# Patient Record
Sex: Female | Born: 1960 | Race: White | Hispanic: No | Smoking: Former smoker
Health system: Southern US, Community
[De-identification: ages and names within clinical notes are randomized; demographics above are authoritative.]

## PROBLEM LIST (undated history)

## (undated) DIAGNOSIS — I10 Essential (primary) hypertension: Secondary | ICD-10-CM

## (undated) DIAGNOSIS — J449 Chronic obstructive pulmonary disease, unspecified: Secondary | ICD-10-CM

## (undated) DIAGNOSIS — M810 Age-related osteoporosis without current pathological fracture: Secondary | ICD-10-CM

## (undated) DIAGNOSIS — R350 Frequency of micturition: Secondary | ICD-10-CM

## (undated) DIAGNOSIS — Z8742 Personal history of other diseases of the female genital tract: Secondary | ICD-10-CM

## (undated) DIAGNOSIS — F329 Major depressive disorder, single episode, unspecified: Secondary | ICD-10-CM

## (undated) DIAGNOSIS — K219 Gastro-esophageal reflux disease without esophagitis: Secondary | ICD-10-CM

## (undated) DIAGNOSIS — M329 Systemic lupus erythematosus, unspecified: Secondary | ICD-10-CM

## (undated) DIAGNOSIS — M1990 Unspecified osteoarthritis, unspecified site: Secondary | ICD-10-CM

## (undated) DIAGNOSIS — F419 Anxiety disorder, unspecified: Secondary | ICD-10-CM

## (undated) DIAGNOSIS — Z780 Asymptomatic menopausal state: Secondary | ICD-10-CM

## (undated) DIAGNOSIS — N930 Postcoital and contact bleeding: Secondary | ICD-10-CM

## (undated) DIAGNOSIS — R87619 Unspecified abnormal cytological findings in specimens from cervix uteri: Secondary | ICD-10-CM

## (undated) DIAGNOSIS — R896 Abnormal cytological findings in specimens from other organs, systems and tissues: Secondary | ICD-10-CM

## (undated) DIAGNOSIS — M797 Fibromyalgia: Secondary | ICD-10-CM

## (undated) DIAGNOSIS — R87629 Unspecified abnormal cytological findings in specimens from vagina: Secondary | ICD-10-CM

## (undated) DIAGNOSIS — R319 Hematuria, unspecified: Secondary | ICD-10-CM

## (undated) DIAGNOSIS — C55 Malignant neoplasm of uterus, part unspecified: Secondary | ICD-10-CM

## (undated) DIAGNOSIS — IMO0002 Reserved for concepts with insufficient information to code with codable children: Secondary | ICD-10-CM

## (undated) DIAGNOSIS — K279 Peptic ulcer, site unspecified, unspecified as acute or chronic, without hemorrhage or perforation: Secondary | ICD-10-CM

## (undated) HISTORY — DX: Unspecified abnormal cytological findings in specimens from cervix uteri: R87.619

## (undated) HISTORY — PX: OTHER SURGICAL HISTORY: SHX169

## (undated) HISTORY — DX: Asymptomatic menopausal state: Z78.0

## (undated) HISTORY — DX: Anxiety disorder, unspecified: F41.9

## (undated) HISTORY — DX: Abnormal cytological findings in specimens from other organs, systems and tissues: R89.6

## (undated) HISTORY — DX: Age-related osteoporosis without current pathological fracture: M81.0

## (undated) HISTORY — DX: Gastro-esophageal reflux disease without esophagitis: K21.9

## (undated) HISTORY — DX: Postcoital and contact bleeding: N93.0

## (undated) HISTORY — DX: Unspecified osteoarthritis, unspecified site: M19.90

## (undated) HISTORY — DX: Peptic ulcer, site unspecified, unspecified as acute or chronic, without hemorrhage or perforation: K27.9

## (undated) HISTORY — PX: ECTOPIC PREGNANCY SURGERY: SHX613

## (undated) HISTORY — DX: Major depressive disorder, single episode, unspecified: F32.9

## (undated) HISTORY — DX: Unspecified abnormal cytological findings in specimens from vagina: R87.629

## (undated) HISTORY — DX: Reserved for concepts with insufficient information to code with codable children: IMO0002

## (undated) HISTORY — DX: Personal history of other diseases of the female genital tract: Z87.42

## (undated) HISTORY — DX: Frequency of micturition: R35.0

## (undated) HISTORY — DX: Hematuria, unspecified: R31.9

## (undated) HISTORY — DX: Fibromyalgia: M79.7

## (undated) HISTORY — DX: Systemic lupus erythematosus, unspecified: M32.9

## (undated) HISTORY — DX: Malignant neoplasm of uterus, part unspecified: C55

---

## 2007-03-18 ENCOUNTER — Ambulatory Visit (HOSPITAL_COMMUNITY): Admission: RE | Admit: 2007-03-18 | Discharge: 2007-03-18 | Payer: Self-pay | Admitting: Family Medicine

## 2007-08-13 ENCOUNTER — Ambulatory Visit (HOSPITAL_COMMUNITY): Admission: RE | Admit: 2007-08-13 | Discharge: 2007-08-13 | Payer: Self-pay | Admitting: Family Medicine

## 2007-09-17 ENCOUNTER — Ambulatory Visit (HOSPITAL_COMMUNITY): Admission: RE | Admit: 2007-09-17 | Discharge: 2007-09-17 | Payer: Self-pay | Admitting: Family Medicine

## 2007-09-19 ENCOUNTER — Ambulatory Visit (HOSPITAL_COMMUNITY): Admission: RE | Admit: 2007-09-19 | Discharge: 2007-09-19 | Payer: Self-pay | Admitting: Family Medicine

## 2007-09-27 ENCOUNTER — Ambulatory Visit: Payer: Self-pay | Admitting: Internal Medicine

## 2007-10-10 ENCOUNTER — Other Ambulatory Visit: Admission: RE | Admit: 2007-10-10 | Discharge: 2007-10-10 | Payer: Self-pay | Admitting: Obstetrics and Gynecology

## 2007-10-14 ENCOUNTER — Ambulatory Visit (HOSPITAL_COMMUNITY): Admission: RE | Admit: 2007-10-14 | Discharge: 2007-10-14 | Payer: Self-pay | Admitting: Obstetrics and Gynecology

## 2007-10-21 ENCOUNTER — Encounter (INDEPENDENT_AMBULATORY_CARE_PROVIDER_SITE_OTHER): Payer: Self-pay | Admitting: Diagnostic Radiology

## 2007-10-21 ENCOUNTER — Ambulatory Visit (HOSPITAL_COMMUNITY): Admission: RE | Admit: 2007-10-21 | Discharge: 2007-10-21 | Payer: Self-pay | Admitting: Obstetrics and Gynecology

## 2008-07-15 ENCOUNTER — Emergency Department (HOSPITAL_COMMUNITY): Admission: EM | Admit: 2008-07-15 | Discharge: 2008-07-15 | Payer: Self-pay | Admitting: Emergency Medicine

## 2008-08-03 ENCOUNTER — Ambulatory Visit (HOSPITAL_COMMUNITY): Admission: RE | Admit: 2008-08-03 | Discharge: 2008-08-03 | Payer: Self-pay | Admitting: Family Medicine

## 2008-08-19 ENCOUNTER — Ambulatory Visit (HOSPITAL_COMMUNITY): Admission: RE | Admit: 2008-08-19 | Discharge: 2008-08-19 | Payer: Self-pay | Admitting: Family Medicine

## 2009-04-15 ENCOUNTER — Other Ambulatory Visit: Admission: RE | Admit: 2009-04-15 | Discharge: 2009-04-15 | Payer: Self-pay | Admitting: Obstetrics and Gynecology

## 2009-08-03 ENCOUNTER — Emergency Department (HOSPITAL_COMMUNITY): Admission: EM | Admit: 2009-08-03 | Discharge: 2009-08-03 | Payer: Self-pay | Admitting: Emergency Medicine

## 2010-02-02 ENCOUNTER — Ambulatory Visit (HOSPITAL_COMMUNITY): Admission: RE | Admit: 2010-02-02 | Discharge: 2010-02-02 | Payer: Self-pay | Admitting: Obstetrics and Gynecology

## 2010-06-02 ENCOUNTER — Other Ambulatory Visit
Admission: RE | Admit: 2010-06-02 | Discharge: 2010-06-02 | Payer: Self-pay | Source: Home / Self Care | Admitting: Obstetrics and Gynecology

## 2010-09-02 ENCOUNTER — Ambulatory Visit (HOSPITAL_COMMUNITY)
Admission: RE | Admit: 2010-09-02 | Discharge: 2010-09-02 | Disposition: A | Discharge status: Home / Self Care | Payer: 59 | Source: Ambulatory Visit | Attending: Pain Medicine | Admitting: Pain Medicine

## 2010-09-02 ENCOUNTER — Other Ambulatory Visit (HOSPITAL_COMMUNITY): Payer: Self-pay | Admitting: Unknown Physician Specialty

## 2010-09-02 DIAGNOSIS — M25559 Pain in unspecified hip: Secondary | ICD-10-CM | POA: Insufficient documentation

## 2010-09-02 DIAGNOSIS — M545 Low back pain, unspecified: Secondary | ICD-10-CM | POA: Insufficient documentation

## 2010-09-02 DIAGNOSIS — R52 Pain, unspecified: Secondary | ICD-10-CM

## 2010-09-21 LAB — URINE MICROSCOPIC-ADD ON

## 2010-09-21 LAB — URINALYSIS, ROUTINE W REFLEX MICROSCOPIC
Bilirubin Urine: NEGATIVE
Glucose, UA: NEGATIVE mg/dL
Nitrite: NEGATIVE
Specific Gravity, Urine: 1.01 (ref 1.005–1.030)
pH: 6.5 (ref 5.0–8.0)

## 2010-09-21 LAB — URINE CULTURE: Colony Count: >=100000

## 2010-10-17 LAB — COMPREHENSIVE METABOLIC PANEL
ALT: 24 U/L (ref 0–35)
AST: 22 U/L (ref 0–37)
BUN: 4 mg/dL — ABNORMAL LOW (ref 6–23)
CO2: 28 mEq/L (ref 19–32)
Chloride: 101 mEq/L (ref 96–112)
GFR calc non Af Amer: >60 mL/min (ref >60)
Glucose, Bld: 78 mg/dL (ref 70–99)
Total Bilirubin: 0.5 mg/dL (ref 0.3–1.2)
Total Protein: 6.5 g/dL (ref 6.0–8.3)

## 2010-10-17 LAB — POCT CARDIAC MARKERS
Myoglobin, poc: 52.5 ng/mL (ref 12–200)
Troponin i, poc: <0.05 ng/mL (ref 0.00–0.09)
Troponin i, poc: <0.05 ng/mL (ref 0.00–0.09)

## 2010-10-17 LAB — CBC
Hemoglobin: 13.6 g/dL (ref 12.0–15.0)
MCHC: 34.3 g/dL (ref 30.0–36.0)
MCV: 98.9 fL (ref 78.0–100.0)
Platelets: 193 10*3/uL (ref 150–400)
RBC: 4 MIL/uL (ref 3.87–5.11)
RDW: 12.8 % (ref 11.5–15.5)
WBC: 6.7 10*3/uL (ref 4.0–10.5)

## 2010-10-17 LAB — DIFFERENTIAL
Basophils Absolute: 0 10*3/uL (ref 0.0–0.1)
Eosinophils Absolute: 0.2 10*3/uL (ref 0.0–0.7)
Eosinophils Relative: 4 % (ref 0–5)
Lymphocytes Relative: 34 % (ref 12–46)
Lymphs Abs: 2.3 10*3/uL (ref 0.7–4.0)
Monocytes Absolute: 0.4 10*3/uL (ref 0.1–1.0)
Monocytes Relative: 6 % (ref 3–12)
Neutro Abs: 3.7 10*3/uL (ref 1.7–7.7)
Neutrophils Relative %: 55 % (ref 43–77)

## 2010-10-28 ENCOUNTER — Other Ambulatory Visit (HOSPITAL_COMMUNITY): Payer: Self-pay | Admitting: Family Medicine

## 2010-10-28 DIAGNOSIS — R1011 Right upper quadrant pain: Secondary | ICD-10-CM

## 2010-11-01 ENCOUNTER — Ambulatory Visit (HOSPITAL_COMMUNITY)
Admission: RE | Admit: 2010-11-01 | Discharge: 2010-11-01 | Disposition: A | Discharge status: Home / Self Care | Payer: 59 | Source: Ambulatory Visit | Attending: Family Medicine | Admitting: Family Medicine

## 2010-11-01 DIAGNOSIS — K7689 Other specified diseases of liver: Secondary | ICD-10-CM | POA: Insufficient documentation

## 2010-11-01 DIAGNOSIS — R9389 Abnormal findings on diagnostic imaging of other specified body structures: Secondary | ICD-10-CM | POA: Insufficient documentation

## 2010-11-01 DIAGNOSIS — R1011 Right upper quadrant pain: Secondary | ICD-10-CM | POA: Insufficient documentation

## 2010-11-07 ENCOUNTER — Encounter: Payer: Self-pay | Admitting: Gastroenterology

## 2010-11-07 ENCOUNTER — Ambulatory Visit (INDEPENDENT_AMBULATORY_CARE_PROVIDER_SITE_OTHER): Payer: 59 | Admitting: Gastroenterology

## 2010-11-07 VITALS — BP 131/87 | HR 105 | Temp 98.6°F | Ht 63.0 in | Wt 143.0 lb

## 2010-11-07 DIAGNOSIS — K7689 Other specified diseases of liver: Secondary | ICD-10-CM

## 2010-11-07 DIAGNOSIS — R1011 Right upper quadrant pain: Secondary | ICD-10-CM

## 2010-11-07 DIAGNOSIS — R110 Nausea: Secondary | ICD-10-CM

## 2010-11-07 DIAGNOSIS — Q619 Cystic kidney disease, unspecified: Secondary | ICD-10-CM

## 2010-11-07 DIAGNOSIS — K760 Fatty (change of) liver, not elsewhere classified: Secondary | ICD-10-CM | POA: Insufficient documentation

## 2010-11-07 DIAGNOSIS — N281 Cyst of kidney, acquired: Secondary | ICD-10-CM | POA: Insufficient documentation

## 2010-11-07 DIAGNOSIS — Z8379 Family history of other diseases of the digestive system: Secondary | ICD-10-CM

## 2010-11-07 DIAGNOSIS — K869 Disease of pancreas, unspecified: Secondary | ICD-10-CM

## 2010-11-07 LAB — CBC WITH DIFFERENTIAL/PLATELET
Hemoglobin: 13.9 g/dL (ref 12.0–16.0)
platelet count: 245

## 2010-11-07 LAB — HEPATIC FUNCTION PANEL
ALT: 12 U/L (ref 7–35)
AST: 17 U/L
Alkaline Phosphatase: 53 U/L
Bilirubin, Direct: 0.1 mg/dL (ref 0.01–0.4)
Indirect Bilirubin: 0.3

## 2010-11-07 NOTE — Assessment & Plan Note (Signed)
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 Reevaluate comment MR of the abdomen. May consider referral for urological opinion.

## 2010-11-07 NOTE — Assessment & Plan Note (Signed)
Refer to assessment and plan for right upper quadrant abdominal pain.

## 2010-11-07 NOTE — Assessment & Plan Note (Signed)
Father and multiple paternal uncles had cirrhosis of the liver. Father died with liver cancer. All of them were heavy drinkers. No details of any other type of liver disease within her family. MRI of the abdomen planned. Encouraged avoidance of alcohol use.

## 2010-11-07 NOTE — Assessment & Plan Note (Signed)
4 week history of persistent right upper quadrant abdominal pain, associated with nausea and abdominal bloating. Pain worse with prolonged sitting. Nausea and bloating worse with meals. Pain is constant and not necessarily aggravated by meals. Abdominal ultrasound multiple findings as outlined above. I don't think any of them explain her abdominal pain however. HIDA scan to eliminate gallbladder disease as a possibility. This may be more musculoskeletal or abdominal wall pain.

## 2010-11-07 NOTE — Assessment & Plan Note (Signed)
Likely fatty liver based on abdominal ultrasound on the images are limited. Further evaluation with MR at time of evaluation of pancreatic lesion. She is not significantly overweight. She consumes alcohol very infrequently however had a few short years of more regular use which was going to divorce. No personal history of diabetes however diabetes is significant for her family. LFTs currently normal. Basic metabolic panel to be done.

## 2010-11-07 NOTE — Progress Notes (Signed)
Primary Care Physician:  Alice Reichert, MD  Primary Gastroenterologist:  Dr. Roetta Sessions  Chief Complaint  Patient presents with  . Abdominal Pain    RUQ Pain and swelling    HPI:  Susan Baker is a 50 y.o. female here at request of Dr. Butch Penny for further evaluation of right upper quadrant abdominal pain and bloating. Abdominal bloating some days. Abdomen sore. Different than pain in 2009. Pain present for last four weeks. Not really worse with meals, pain is constant. Bloating worse with meals. Nausea worse with meal. No heartburn. Some belching, not bad. BM regular. BM most days. No melena, brbpr. No weight loss. Little weight gain on Cymbalta. Denies any cough, shortness of breath, dysuria, hematuria.  Abdominal ultrasound recently showed normal gallbladder, common bile duct 5 mm, echogenic, likely fatty infiltration of the liver (detail suboptimal), questionable fatty replacement of the pancreas, tiny focus of questionable hypo-echogenicity at pancreatic body measuring 12 mm, tiny mass or nodule not excluded. 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 No hydronephrosis.  Recent labs showed a normal white blood cell count of 9300, hemoglobin 13.9, platelet count 245,000, total bilirubin 0.4, alkaline phosphatase 53, AST 17, ALT 12, albumin 4.5   Current Outpatient Prescriptions  Medication Sig Dispense Refill  . cyclobenzaprine (FLEXERIL) 10 MG tablet Take 10 mg by mouth 3 (three) times daily as needed.        . CYMBALTA 60 MG capsule       . HYDROcodone-acetaminophen (NORCO) 10-325 MG per tablet         Allergies as of 11/07/2010 - Review Complete 11/07/2010  Allergen Reaction Noted  . Daypro (oxaprozin)  11/07/2010    Past Medical History  Diagnosis Date  . Fibromyalgia   . Uterine cancer     . PUD (peptic ulcer disease)     bleeding PUD per patient, remote, On Daypro  . GERD (gastroesophageal reflux disease)     egd with RE per patient, remote  . Bulging disc     cervical, Pain [facility]    Past Surgical History  Procedure Date  . Hysterectomy for uterine cancer     partial  . Ectopic pregnancy surgery     twice    Family History  Problem Relation Age of Onset  . Cirrhosis Father     etoh, died with liver cancer  . Cirrhosis Paternal Uncle     multiple, etoh  . Colon cancer Neg Hx   . Pancreatic cancer Paternal Grandmother     History   Social History  . Marital Status: Divorced    Spouse Name: N/A    Number of Children: 1  . 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 Smoking status: Current Everyday Smoker -- 1.0 packs/day    Types: Cigarettes  . Smokeless tobacco: Not on file  . Alcohol Use: Yes     One drink ever 3 weeks  . Drug Use: No  . Sexually Active: Not on file      ROS:  General: Negative for anorexia, weight loss, fever, chills, fatigue, weakness. Eyes: Negative for vision changes.  ENT: Negative for hoarseness, difficulty swallowing , nasal congestion. CV: Negative for chest pain, angina, palpitations, dyspnea on exertion, peripheral edema.  Respiratory: Negative for dyspnea at rest, dyspnea on exertion, cough, sputum, wheezing.  GI: See history of present illness. GU:  Negative for dysuria, hematuria, urinary incontinence, urinary frequency, nocturnal urination.  MS: Negative for joint pain, low back pain.  Derm: Negative for rash or itching.  Neuro: Negative for weakness, abnormal sensation, seizure, frequent headaches, memory loss, confusion.  Psych: Negative for anxiety, depression, suicidal ideation, hallucinations.  Endo: Negative for unusual weight change.  Heme: Negative for bruising or bleeding. Allergy: Negative for rash or hives.    Physical  Examination:  BP 131/87  Pulse 105  Temp(Src) 98.6 F (37 C) (Tympanic)  Ht 5\' 3"  (1.6 m)  Wt 143 lb (64.864 kg)  BMI 25.33 kg/m2   General: Well-nourished, well-developed in no acute distress.  Head: Normocephalic, atraumatic.   Eyes: Conjunctiva pink, no icterus. Mouth: Oropharyngeal mucosa moist and pink , no lesions erythema or exudate. Neck: Supple without thyromegaly, masses, or lymphadenopathy.  Lungs: Clear to auscultation bilaterally.  Heart: Regular rate and rhythm, no murmurs rubs or gallops.  Abdomen: Bowel sounds are normal, mild RUQ tenderness including right lower ribcage, mild diffuse tenderness, nondistended, no hepatosplenomegaly or masses, no abdominal bruits or    hernia , no rebound or guarding.   Extremities: No lower extremity edema.  Neuro: Alert and oriented x 4 , grossly normal neurologically.  Skin: Warm and dry, no rash or jaundice.   Psych: Alert and cooperative, normal mood and affect.

## 2010-11-07 NOTE — Progress Notes (Signed)
Cc to PCP 

## 2010-11-07 NOTE — Assessment & Plan Note (Signed)
12 mm pancreatic lesion seen on abdominal ultrasound. Needs further evaluation. Radiologist recommends MRI of the abdomen with and without contrast. Basic metabolic panel and lipase to be done.

## 2010-11-08 ENCOUNTER — Ambulatory Visit (HOSPITAL_COMMUNITY)
Admission: RE | Admit: 2010-11-08 | Discharge: 2010-11-08 | Disposition: A | Discharge status: Home / Self Care | Payer: 59 | Source: Ambulatory Visit | Attending: Gastroenterology | Admitting: Gastroenterology

## 2010-11-08 DIAGNOSIS — R109 Unspecified abdominal pain: Secondary | ICD-10-CM | POA: Insufficient documentation

## 2010-11-08 DIAGNOSIS — K869 Disease of pancreas, unspecified: Secondary | ICD-10-CM

## 2010-11-08 DIAGNOSIS — Z8379 Family history of other diseases of the digestive system: Secondary | ICD-10-CM

## 2010-11-08 DIAGNOSIS — K7689 Other specified diseases of liver: Secondary | ICD-10-CM | POA: Insufficient documentation

## 2010-11-08 DIAGNOSIS — N281 Cyst of kidney, acquired: Secondary | ICD-10-CM

## 2010-11-08 DIAGNOSIS — K760 Fatty (change of) liver, not elsewhere classified: Secondary | ICD-10-CM

## 2010-11-08 DIAGNOSIS — R1011 Right upper quadrant pain: Secondary | ICD-10-CM

## 2010-11-08 DIAGNOSIS — N200 Calculus of kidney: Secondary | ICD-10-CM | POA: Insufficient documentation

## 2010-11-08 MED ORDER — GADOBENATE DIMEGLUMINE 529 MG/ML IV SOLN: 13.0000 mL | Freq: Once | INTRAVENOUS | Status: AC | PRN

## 2010-11-09 ENCOUNTER — Encounter (HOSPITAL_COMMUNITY)
Admission: RE | Admit: 2010-11-09 | Discharge: 2010-11-09 | Disposition: A | Discharge status: Home / Self Care | Payer: 59 | Source: Ambulatory Visit | Attending: Gastroenterology | Admitting: Gastroenterology

## 2010-11-09 ENCOUNTER — Encounter (HOSPITAL_COMMUNITY): Payer: Self-pay

## 2010-11-09 ENCOUNTER — Encounter: Payer: Self-pay | Admitting: Gastroenterology

## 2010-11-09 DIAGNOSIS — R1011 Right upper quadrant pain: Secondary | ICD-10-CM | POA: Insufficient documentation

## 2010-11-09 DIAGNOSIS — R110 Nausea: Secondary | ICD-10-CM

## 2010-11-09 MED ORDER — TECHNETIUM TC 99M MEBROFENIN IV KIT
5.0000 | PACK | Freq: Once | INTRAVENOUS | Status: AC | PRN
  Administered 2010-11-09: 5 via INTRAVENOUS

## 2010-11-10 ENCOUNTER — Encounter: Payer: Self-pay | Admitting: Gastroenterology

## 2010-11-10 ENCOUNTER — Telehealth: Payer: Self-pay | Admitting: Gastroenterology

## 2010-11-10 MED ORDER — DEXLANSOPRAZOLE 60 MG PO CPDR: 60.0000 mg | DELAYED_RELEASE_CAPSULE | Freq: Every day | ORAL | Status: DC

## 2010-11-10 NOTE — Telephone Encounter (Signed)
Tried to call patient, [facility] for return call.

## 2010-11-10 NOTE — Telephone Encounter (Signed)
Discussed all results with patient. Questions answered. Nexium not covered on plan. Please cancel RX. Try Dexilant 60mg  po daily #30, 3 rf. RX sent to [facility]. Give rebate card and #10 samples. Patient plans to come by Monday to have me sign return to work note. Patient will drop off FMLA forms for me to fill out.

## 2010-11-11 NOTE — Telephone Encounter (Signed)
Samples at front desk 

## 2010-11-15 NOTE — H&P (Signed)
NAME:  Susan Baker, Susan Baker                 ACCOUNT NO.:  [id number]   MEDICAL RECORD NO.:  [id number]          PATIENT TYPE:  AMB   LOCATION:  DAY                            FACILITY:   PHYSICIAN:  R. Roetta Sessions, M.D. DATE OF BIRTH:  1960-08-07   DATE OF ADMISSION:  DATE OF DISCHARGE:  LH                              HISTORY & PHYSICAL   REASON FOR CONSULTATION:  Right upper quadrant, right flank pain.   HISTORY OF PRESENT ILLNESS:  Ms. Ciccone is a very pleasant 50 year old  Caucasian female from [place], [place], sent over at the courtesy of  Dr. Butch Penny to further evaluate a several-week history of  intermittent right upper quadrant abdominal pain which she relates  radiates into her lateral right ribcage margin and somewhat into her  back.  She states this has come on a time or two when she is laying in  her bathtub washing her hair, and other times after she eats.  It may  occur after eating a heavy meal or it may occur after eating something  such as cereal.  It may last for hours.  These symptoms wax and wane  throughout the day and do not otherwise have a definite positional  component.  She really denies any midline abdominal pain and no typical  reflux symptoms, odynophagia, dysphagia, early satiety.  She does have  some nausea and bloating.  She says she underwent an EGD several years  ago and was told she had acid burns in her esophagus and should be on  acid blocker agent for life which she is not currently taking.  She has  not had any melena or hematochezia.  She is chronically constipated, has  one bowel movement daily to every-other day to every third day on  occasion.  She has a personal history of cancer of the uterus status  post hysterectomy.   Ms. Stovall tells me that she had a bleeding stomach ulcer several years  ago and describes going undergoing an EGD and was told that she had a  stomach ulcer.  She does not require remember any other specific   details.   Dr. Renard Matter has worked these symptoms up with a hepatic profile which  came back entirely normal.  She underwent ultrasound of the gallbladder  which demonstrated normal gallbladder biliary tree, two small left renal  cysts.  CT of the abdomen subsequently performed which revealed  nonobstructing calculi upper pole left kidney, no other abnormalities  seen.   She has three to four alcoholic beverages weekly.  She smokes one pack  of cigarettes daily.  She denies rash in the area in question.   PAST MEDICAL HISTORY:  Significant for fibromyalgia.  She has a history  of two ectopic pregnancies, is status post hysterectomy for uterine  cancer.   CURRENT MEDICATIONS:  1. Lyrica 75 mg t.i.d.  2. Hydrocodone 10/500 p.r.n.   ALLERGIES:  DAYPRO.   FAMILY HISTORY:  Father died with cirrhosis.  He was an alcoholic.  He  had multiple other brothers with cirrhosis.  They were drinkers as well.  Otherwise, no history of chronic GI or liver illness.   SOCIAL HISTORY:  The patient is divorced.  She has one son in high  school.  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  Alcohol and tobacco as outlined above.  She denies illicit drug use.   REVIEW OF SYSTEMS:  No chest pain, dyspnea on exertion.  No change in  weight.  No yellow jaundice, clay-colored stool or dark-colored urine.   PHYSICAL EXAMINATION:  Pleasant, tan, 46-year lady, resting comfortably.  Weight 118.5, height 5 feet 3 inches.  Temperature 98, BP 100/70, pulse  88.  SKIN:  Warm and dry, tan, but no cutaneous stigmata of chronic liver  disease.  No scleral icterus.  Conjunctivae are pink.  BREAST EXAM:  Deferred.  She does have an element of chest wall and  lateral rib cage tenderness to palpation.  I do not appreciate a rash.  ABDOMINAL EXAM:  Flat, positive bowel sounds.  She has tenderness from  the epigastrium along the right costal margin.  No appreciable mass or  organomegaly.  EXTREMITY EXAM:   No edema.   IMPRESSION:  Ms. Welden is a pleasant 50 year old lady with a several-  week history of right upper quadrant/right lateral ribcage radiating to  the flank discomfort which has a vague positional component as well as a  postprandial component.  She has a history of complicated  gastroesophageal reflux disease by history, is not on any acid  suppression therapy.  Her symptoms really do not sound as they are  emanating from her luminal upper GI tract.  I doubt we are dealing with  a peptic ulcer or reflux-related phenomenon   She could have occult gallbladder disease in the way of biliary  dyskinesia.  An element of abdominal/chest wall pain is also a  possibility.  Otherwise, there is nothing to suggest something  neuropathic such as shingles at this time.   RECOMMENDATIONS:  I gave Ms. Lorson some AcipHex 20 mg tablets one each  morning for 14 days.  We will see how she does with that therapy  empirically at the end of the 2-week.  If she is not markedly improved  will consider further evaluation.  She gives a history of peptic ulcer  disease previously and would consider an EGD just to rule out luminal  pathology.  Ultimately, she may end up with further evaluation her  gallbladder in the way of a HIDA with fatty meal challenge.   I would like to thank Dr. Butch Penny for allowing me to see this nice  lady today.  Further recommendations to follow.      Jonathon Bellows, M.D.  Electronically Signed     RMR/MEDQ  D:  09/27/2007  T:  09/28/2007  Job:  [id number]   cc:   Angus G. Renard Matter, MD  Fax: [phone]

## 2010-11-21 ENCOUNTER — Telehealth: Payer: Self-pay | Admitting: General Practice

## 2010-11-21 NOTE — Telephone Encounter (Signed)
Patient called and wanted an office visit to come and speak to you about her Cymbalta..._Please advise??

## 2010-11-22 NOTE — Telephone Encounter (Signed)
Would recommend she discuss any questions regarding Cymbalta with the prescribing physician.

## 2010-11-24 ENCOUNTER — Telehealth: Payer: Self-pay

## 2010-11-24 NOTE — Telephone Encounter (Signed)
I informed patient.

## 2010-11-24 NOTE — Telephone Encounter (Signed)
Proceed with EGD. Continue PPI.

## 2010-11-24 NOTE — Telephone Encounter (Signed)
Pt called- she is still having throbbing URQ abd pain. +nausea, no vomiting, no fever, last bm was yesterday and it was normal, no blood in stool. Pt has been out of work 2 days this week and wants to know what the next step is. Please advise.

## 2010-11-25 ENCOUNTER — Encounter: Payer: Self-pay | Admitting: Internal Medicine

## 2010-11-25 NOTE — Telephone Encounter (Signed)
Pt is scheduled for EGD on 12/01/10- went over instructions on the phone and mailed copy- cdg

## 2010-12-01 ENCOUNTER — Encounter: Payer: 59 | Admitting: Internal Medicine

## 2010-12-01 ENCOUNTER — Other Ambulatory Visit: Payer: Self-pay | Admitting: Internal Medicine

## 2010-12-01 ENCOUNTER — Ambulatory Visit (HOSPITAL_COMMUNITY)
Admission: RE | Admit: 2010-12-01 | Discharge: 2010-12-01 | Disposition: A | Discharge status: Home / Self Care | Payer: 59 | Source: Ambulatory Visit | Attending: Internal Medicine | Admitting: Internal Medicine

## 2010-12-01 DIAGNOSIS — R1011 Right upper quadrant pain: Secondary | ICD-10-CM | POA: Insufficient documentation

## 2010-12-01 DIAGNOSIS — K2960 Other gastritis without bleeding: Secondary | ICD-10-CM

## 2010-12-01 DIAGNOSIS — K2100 Gastro-esophageal reflux disease with esophagitis, without bleeding: Secondary | ICD-10-CM | POA: Insufficient documentation

## 2010-12-01 HISTORY — PX: ESOPHAGOGASTRODUODENOSCOPY: SHX1529

## 2010-12-12 ENCOUNTER — Encounter: Payer: Self-pay | Admitting: Gastroenterology

## 2010-12-12 ENCOUNTER — Ambulatory Visit (INDEPENDENT_AMBULATORY_CARE_PROVIDER_SITE_OTHER): Payer: 59 | Admitting: Gastroenterology

## 2010-12-12 VITALS — BP 129/87 | HR 84 | Temp 98.0°F | Ht 63.0 in | Wt 145.2 lb

## 2010-12-12 DIAGNOSIS — R1011 Right upper quadrant pain: Secondary | ICD-10-CM

## 2010-12-12 DIAGNOSIS — K259 Gastric ulcer, unspecified as acute or chronic, without hemorrhage or perforation: Secondary | ICD-10-CM | POA: Insufficient documentation

## 2010-12-12 DIAGNOSIS — K2960 Other gastritis without bleeding: Secondary | ICD-10-CM

## 2010-12-12 MED ORDER — DEXLANSOPRAZOLE 60 MG PO CPDR: 60.0000 mg | DELAYED_RELEASE_CAPSULE | Freq: Every day | ORAL | Status: DC

## 2010-12-12 NOTE — Progress Notes (Signed)
Primary Care Physician: Alice Reichert, MD  Primary Gastroenterologist:  Roetta Sessions, MD  Chief Complaint  Patient presents with  . Follow-up    ruq pain, erosions    HPI: Susan Baker is a 50 y.o. female here for followup visit. She had EGD on 12/01/2010 which showed mild distal erosive reflux esophagitis, antral erosions due to NSAIDs. She has a history of right upper quadrant abdominal pain for several weeks. She's had an extensive evaluation, including abdominal ultrasound, HIDA scan, MRI of the abdomen to follow up with abnormalities on abdominal ultrasound. Please see separate reports. She's been on Dexilant now for several weeks. She is finally noticing some improvement in her symptoms. It is felt that some of her right upper quadrant pain is more musculoskeletal as it is reproduced with palpation over the right lower rib cage margin. She has been on a drug holiday with taking no NSAID's or aspirin. Denies any nausea or vomiting, heartburn, constipation, diarrhea, melena, rectal bleeding. Abdominal pain improved.  Current Outpatient Prescriptions  Medication Sig Dispense Refill  . cyclobenzaprine (FLEXERIL) 10 MG tablet Take 10 mg by mouth 3 (three) times daily as needed.        . CYMBALTA 60 MG capsule 30 mg.       . dexlansoprazole (DEXILANT) 60 MG capsule Take 1 capsule (60 mg total) by mouth daily.  30 capsule  11  . HYDROcodone-acetaminophen (NORCO) 10-325 MG per tablet       . DISCONTD: dexlansoprazole (DEXILANT) 60 MG capsule Take 60 mg by mouth daily.          Allergies as of 12/12/2010 - Review Complete 12/12/2010  Allergen Reaction Noted  . Daypro (oxaprozin)  11/07/2010    ROS:  General: Negative for anorexia, weight loss, fever, chills, fatigue, weakness. ENT: Negative for hoarseness, difficulty swallowing , nasal congestion. CV: Negative for chest pain, angina, palpitations, dyspnea on exertion, peripheral edema.  Respiratory: Negative for dyspnea at rest,  dyspnea on exertion, cough, sputum, wheezing.  GI: See history of present illness. GU:  Negative for dysuria, hematuria, urinary incontinence, urinary frequency, nocturnal urination.  Endo: Negative for unusual weight change.    Physical Examination:   BP 129/87  Pulse 84  Temp(Src) 98 F (36.7 C) (Temporal)  Ht 5\' 3"  (1.6 m)  Wt 145 lb 3.2 oz (65.862 kg)  BMI 25.72 kg/m2  General: Well-nourished, well-developed in no acute distress.  Eyes: No icterus. Mouth: Oropharyngeal mucosa moist and pink , no lesions erythema or exudate. Lungs: Clear to auscultation bilaterally.  Heart: Regular rate and rhythm, no murmurs rubs or gallops.  Abdomen: Bowel sounds are normal, nontender, nondistended, no hepatosplenomegaly or masses, no abdominal bruits or hernia , no rebound or guarding.   Extremities: No lower extremity edema.  Neuro: Alert and oriented x 4   Skin: Warm and dry, no jaundice.   Psych: Alert and cooperative, normal mood and affect.

## 2010-12-12 NOTE — Assessment & Plan Note (Addendum)
Likely multifactorial including musculoskeletal component, antral erosions, erosive reflux esophagitis. Doing better since stopping ibuprofen and BC powders. She was taking at least 2-3 times weekly for headache. Also on Dexilant for several weeks.   Continue to avoid NSAIDS. Apply heating pad 3 times daily to Right lower ribcage. Continue Dexilant. OV in six months with Dr. Jena Gauss or sooner if needed.  Recommend screening colonoscopy at age 50.

## 2010-12-12 NOTE — Progress Notes (Signed)
Cc to PCP 

## 2011-01-16 NOTE — Op Note (Signed)
NAME:  Susan Baker, Susan Baker                 ACCOUNT NO.:  [id number]  MEDICAL RECORD NO.:  [id number]           PATIENT TYPE:  O  LOCATION:  DAYP                          FACILITY:  [facility]  PHYSICIAN:  R. Roetta Sessions, MD FACP FACGDATE OF BIRTH:  March 21, 1961  DATE OF PROCEDURE:  12/01/2010 DATE OF DISCHARGE:                              OPERATIVE REPORT   INDICATIONS FOR PROCEDURE:  A 50 year old lady with 5-week history of right lower anterolateral chest wall pain radiating into the back, some fleeting right upper abdominal quadrant component.  Prior ultrasound demonstrated fatty replacement of pancreas and questionable mass nodule left kidney.  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  Ultrasound of the gallbladder negative. HIDA demonstrating gallbladder of 80%, history of peptic ulcer disease, does take ibuprofen on a regular basis nearly daily.  EGD is now being done.  Risks, benefits, limitations, alternatives and imponderables have been discussed, questions answered.  Please see the documentation in the medical record.  PROCEDURE NOTE:  O2 saturation, blood pressure, pulse and respirations were monitored throughout the entire procedure.  CONSCIOUS SEDATION: 1. Versed 5 mg IV. 2. Demerol 125 mg IV in divided doses. 3. Cetacaine spray for topical oropharyngeal anesthesia.  INSTRUMENT:  Pentax video chip system.  FINDINGS:  Examination of the tubular esophagus revealed a couple of tiny distal esophageal erosions, otherwise the esophagus appeared normal.  EGD junction easily traversed.  Stomach:  Gastric cavity was emptied and insufflated well with air.  Thorough examination of the gastric mucosa including retroflexed view of the proximal stomach esophagogastric junction demonstrated linear antral erosions with a small solitary erosions in the antrum and prepyloric  area and no ulcer infiltrating process seen.  Pylorus was patent and easily traversed. Examination of the bulb and second portion revealed tiny bulbar erosions only.  THERAPEUTIC/DIAGNOSTIC MANEUVERS PERFORMED:  Biopsies of the gastric mucosa were taken to check for H pylori gastritis.  The patient tolerated the procedure well.  IMPRESSION: 1. Tiny distal esophageal erosions consistent with mild erosive reflux     esophagitis. 2. Linear antral erosions more consistent with NSAID effect.  No     ulcer, status post biopsy of bulbar erosions, otherwise D1 and D2     appeared normal.  RECOMMENDATIONS: 1. Continue Dexilant 60 mg orally daily. 2. Drug holiday from ibuprofen and all nonsteroidal's for 2 weeks.  To     follow up with [place] in the office in 2-3 weeks.  Followup on     biopsies.  Further recommendations to follow. 3. Again on physical exam today, a good bit of her right-sided     tenderness and pain is emanating from the rib cage and not the     abdomen. 4. Further recommendations to follow.     Jonathon Bellows, MD FACP [facility]     RMR/MEDQ  D:  12/01/2010  T:  12/02/2010  Job:  [id number]  cc:   Angus G. Renard Matter, MD Fax: [phone]  Electronically Signed by Lorrin Goodell M.D. on 01/16/2011 08:36:21 AM

## 2011-05-19 ENCOUNTER — Encounter: Payer: Self-pay | Admitting: Internal Medicine

## 2011-05-28 ENCOUNTER — Other Ambulatory Visit: Payer: Self-pay | Admitting: Gastroenterology

## 2011-05-29 ENCOUNTER — Other Ambulatory Visit (HOSPITAL_COMMUNITY): Payer: Self-pay | Admitting: Pain Medicine

## 2011-05-29 ENCOUNTER — Ambulatory Visit (HOSPITAL_COMMUNITY)
Admission: RE | Admit: 2011-05-29 | Discharge: 2011-05-29 | Disposition: A | Discharge status: Home / Self Care | Payer: BC Managed Care – PPO | Source: Ambulatory Visit | Attending: Pain Medicine | Admitting: Pain Medicine

## 2011-05-29 DIAGNOSIS — M542 Cervicalgia: Secondary | ICD-10-CM

## 2011-05-29 DIAGNOSIS — M546 Pain in thoracic spine: Secondary | ICD-10-CM | POA: Insufficient documentation

## 2011-05-29 DIAGNOSIS — M549 Dorsalgia, unspecified: Secondary | ICD-10-CM

## 2011-05-29 DIAGNOSIS — M5030 Other cervical disc degeneration, unspecified cervical region: Secondary | ICD-10-CM | POA: Insufficient documentation

## 2011-07-07 ENCOUNTER — Ambulatory Visit: Payer: 59 | Admitting: Internal Medicine

## 2011-07-07 ENCOUNTER — Telehealth: Payer: Self-pay | Admitting: Internal Medicine

## 2011-07-07 NOTE — Telephone Encounter (Signed)
Pt was a no show

## 2011-07-09 NOTE — Telephone Encounter (Signed)
Send her a letter in may requesting she come in and set up a tcs

## 2011-07-10 NOTE — Telephone Encounter (Signed)
Reminder in epic to have follow up OV in May 2013 prior to setting up colonoscopy

## 2011-08-25 ENCOUNTER — Other Ambulatory Visit (HOSPITAL_COMMUNITY): Payer: Self-pay | Admitting: Pain Medicine

## 2011-08-25 DIAGNOSIS — M545 Low back pain: Secondary | ICD-10-CM

## 2011-08-25 DIAGNOSIS — M542 Cervicalgia: Secondary | ICD-10-CM

## 2011-08-25 DIAGNOSIS — M79609 Pain in unspecified limb: Secondary | ICD-10-CM

## 2011-09-05 ENCOUNTER — Ambulatory Visit (HOSPITAL_COMMUNITY): Payer: BC Managed Care – PPO

## 2011-09-07 ENCOUNTER — Other Ambulatory Visit (HOSPITAL_COMMUNITY): Payer: Self-pay | Admitting: Pain Medicine

## 2011-09-07 ENCOUNTER — Ambulatory Visit (HOSPITAL_COMMUNITY)
Admission: RE | Admit: 2011-09-07 | Discharge: 2011-09-07 | Disposition: A | Discharge status: Home / Self Care | Payer: BC Managed Care – PPO | Source: Ambulatory Visit | Attending: Pain Medicine | Admitting: Pain Medicine

## 2011-09-07 DIAGNOSIS — M542 Cervicalgia: Secondary | ICD-10-CM

## 2011-09-07 DIAGNOSIS — M79609 Pain in unspecified limb: Secondary | ICD-10-CM

## 2011-09-07 DIAGNOSIS — M545 Low back pain, unspecified: Secondary | ICD-10-CM | POA: Insufficient documentation

## 2011-09-07 DIAGNOSIS — M5020 Other cervical disc displacement, unspecified cervical region: Secondary | ICD-10-CM | POA: Insufficient documentation

## 2011-10-20 ENCOUNTER — Ambulatory Visit: Payer: Self-pay | Admitting: Internal Medicine

## 2011-11-02 ENCOUNTER — Encounter: Payer: Self-pay | Admitting: Internal Medicine

## 2011-11-02 ENCOUNTER — Other Ambulatory Visit (HOSPITAL_COMMUNITY): Payer: Self-pay | Admitting: Pain Medicine

## 2011-11-02 DIAGNOSIS — M546 Pain in thoracic spine: Secondary | ICD-10-CM

## 2011-11-06 ENCOUNTER — Ambulatory Visit (HOSPITAL_COMMUNITY): Payer: BC Managed Care – PPO

## 2011-11-08 ENCOUNTER — Ambulatory Visit (HOSPITAL_COMMUNITY): Admission: RE | Admit: 2011-11-08 | Payer: BC Managed Care – PPO | Source: Ambulatory Visit

## 2011-11-15 ENCOUNTER — Other Ambulatory Visit: Payer: Self-pay | Admitting: Adult Health

## 2011-11-15 DIAGNOSIS — Z139 Encounter for screening, unspecified: Secondary | ICD-10-CM

## 2011-11-16 ENCOUNTER — Ambulatory Visit (HOSPITAL_COMMUNITY): Payer: BC Managed Care – PPO

## 2011-11-20 ENCOUNTER — Ambulatory Visit (HOSPITAL_COMMUNITY)
Admission: RE | Admit: 2011-11-20 | Discharge: 2011-11-20 | Disposition: A | Discharge status: Home / Self Care | Payer: BC Managed Care – PPO | Source: Ambulatory Visit | Attending: Adult Health | Admitting: Adult Health

## 2011-11-20 ENCOUNTER — Ambulatory Visit (HOSPITAL_COMMUNITY)
Admission: RE | Admit: 2011-11-20 | Discharge: 2011-11-20 | Disposition: A | Discharge status: Home / Self Care | Payer: BC Managed Care – PPO | Source: Ambulatory Visit | Attending: Pain Medicine | Admitting: Pain Medicine

## 2011-11-20 DIAGNOSIS — Z139 Encounter for screening, unspecified: Secondary | ICD-10-CM

## 2011-11-20 DIAGNOSIS — M546 Pain in thoracic spine: Secondary | ICD-10-CM

## 2011-11-20 DIAGNOSIS — Z1231 Encounter for screening mammogram for malignant neoplasm of breast: Secondary | ICD-10-CM | POA: Insufficient documentation

## 2011-11-20 DIAGNOSIS — M5124 Other intervertebral disc displacement, thoracic region: Secondary | ICD-10-CM | POA: Insufficient documentation

## 2011-11-28 ENCOUNTER — Other Ambulatory Visit (HOSPITAL_COMMUNITY): Payer: Self-pay | Admitting: Orthopedic Surgery

## 2011-11-28 DIAGNOSIS — M545 Low back pain: Secondary | ICD-10-CM

## 2011-11-30 ENCOUNTER — Ambulatory Visit (HOSPITAL_COMMUNITY): Payer: BC Managed Care – PPO

## 2012-01-17 ENCOUNTER — Other Ambulatory Visit: Payer: Self-pay | Admitting: Adult Health

## 2012-01-17 ENCOUNTER — Other Ambulatory Visit (HOSPITAL_COMMUNITY)
Admission: RE | Admit: 2012-01-17 | Discharge: 2012-01-17 | Disposition: A | Discharge status: Home / Self Care | Payer: BC Managed Care – PPO | Source: Ambulatory Visit | Attending: Obstetrics and Gynecology | Admitting: Obstetrics and Gynecology

## 2012-01-17 DIAGNOSIS — Z01419 Encounter for gynecological examination (general) (routine) without abnormal findings: Secondary | ICD-10-CM | POA: Insufficient documentation

## 2012-02-01 ENCOUNTER — Telehealth: Payer: Self-pay

## 2012-02-01 NOTE — Telephone Encounter (Signed)
LMOM to call.

## 2012-02-12 NOTE — Telephone Encounter (Signed)
Letter mailed for pt to call.  

## 2012-02-21 ENCOUNTER — Telehealth: Payer: Self-pay

## 2012-02-22 NOTE — Telephone Encounter (Signed)
Needs OV 1st secondary to polypharmacy. Thanks

## 2012-02-22 NOTE — Telephone Encounter (Signed)
LMOM to call and schedule OV appt.

## 2012-02-22 NOTE — Telephone Encounter (Signed)
Gastroenterology Pre-Procedure Form   Pt had recent problems with fibromyalgia and some herniated discs   Request Date: 02/21/2012      Requesting Physician: Cyril Mourning     PATIENT INFORMATION:  Susan Baker is a 51 y.o., female (DOB=09/07/1960).  PROCEDURE: Procedure(s) requested: colonoscopy Procedure Reason: screening for colon cancer  PATIENT REVIEW QUESTIONS: The patient reports the following:   1. Diabetes Melitis: no 2. Joint replacements in the past 12 months: no 3. Major health problems in the past 3 months: no 4. Has an artificial valve or MVP:no 5. Has been advised in past to take antibiotics in advance of a procedure like teeth cleaning: no}    MEDICATIONS & ALLERGIES:    Patient reports the following regarding taking any blood thinners:   Plavix? no Aspirin?no Coumadin?  no  Patient confirms/reports the following medications:  Current Outpatient Prescriptions  Medication Sig Dispense Refill  . ARIPiprazole (ABILIFY) 5 MG tablet Take 5 mg by mouth daily.      Marland Kitchen buPROPion (WELLBUTRIN XL) 300 MG 24 hr tablet Take 300 mg by mouth daily.      Marland Kitchen HYDROcodone-acetaminophen (NORCO) 10-325 MG per tablet every 6 (six) hours as needed.       . pregabalin (LYRICA) 75 MG capsule Take 75 mg by mouth 3 (three) times daily.      . cyclobenzaprine (FLEXERIL) 10 MG tablet Take 10 mg by mouth 3 (three) times daily as needed.        . CYMBALTA 60 MG capsule 30 mg.       . DEXILANT 60 MG capsule TAKE ONE CAPSULE BY MOUTH EVERY DAY  30 capsule  11  . dexlansoprazole (DEXILANT) 60 MG capsule Take 1 capsule (60 mg total) by mouth daily.  30 capsule  11    Patient confirms/reports the following allergies:  Allergies  Allergen Reactions  . Cymbalta (Duloxetine Hcl) Other (See Comments)    Depression, anxiety, wt gain  . Daypro (Oxaprozin) Other (See Comments)    Ulcers  . Trazodone And Nefazodone Other (See Comments)    Pt said she just gets crazy with this    Patient is  appropriate to schedule for requested procedure(s):   AUTHORIZATION INFORMATION Primary Insurance:   ID #:   Group #:  Pre-Cert / Auth required:  Pre-Cert / Auth #:   Secondary Insurance:   ID #:   Group #:  Pre-Cert / Auth required: Pre-Cert / Auth #:   No orders of the defined types were placed in this encounter.    SCHEDULE INFORMATION: Procedure has been scheduled as follows:  Date:             Time: Location:  This Gastroenterology Pre-Precedure Form is being routed to the following provider(s) for review:  Dr. Jena Gauss

## 2012-02-26 NOTE — Telephone Encounter (Signed)
LMOM to call and schedule OV.

## 2012-03-01 NOTE — Telephone Encounter (Signed)
LMOM to call and schedule OV. Mailing pt another letter. Sending a letter to [employer].

## 2012-10-07 ENCOUNTER — Other Ambulatory Visit (HOSPITAL_COMMUNITY): Payer: Self-pay | Admitting: Pain Medicine

## 2012-10-07 DIAGNOSIS — M25512 Pain in left shoulder: Secondary | ICD-10-CM

## 2012-10-14 ENCOUNTER — Encounter (HOSPITAL_COMMUNITY): Payer: Self-pay

## 2012-10-14 ENCOUNTER — Ambulatory Visit (HOSPITAL_COMMUNITY)
Admission: RE | Admit: 2012-10-14 | Discharge: 2012-10-14 | Disposition: A | Discharge status: Home / Self Care | Payer: No Typology Code available for payment source | Source: Ambulatory Visit | Attending: Pain Medicine | Admitting: Pain Medicine

## 2012-10-14 DIAGNOSIS — M67919 Unspecified disorder of synovium and tendon, unspecified shoulder: Secondary | ICD-10-CM | POA: Insufficient documentation

## 2012-10-14 DIAGNOSIS — M719 Bursopathy, unspecified: Secondary | ICD-10-CM | POA: Insufficient documentation

## 2012-10-14 DIAGNOSIS — M25512 Pain in left shoulder: Secondary | ICD-10-CM

## 2012-10-14 DIAGNOSIS — M25519 Pain in unspecified shoulder: Secondary | ICD-10-CM | POA: Insufficient documentation

## 2012-11-07 ENCOUNTER — Telehealth: Payer: Self-pay

## 2012-11-07 NOTE — Telephone Encounter (Signed)
Pt called- she has not been feeling good for the last 8 days. No appetite, nausea and belching. Has had some diarrhea the past 2 days, no blood in her stool. No fever. She did have an episode of vomiting x 1 day but she had eaten some pickles and immediately vomited. She has a history of erosions in her stomach. Pt was on dexilant but lost her insurance and hasnt taken any ppi's since October. Pt has appt next Wednesday with [facility], her last appt was June 2012. I told her that I wasn't sure if we had any appts available any sooner but if someone cancels I would let Walterine know to call pt. Advised pt to stop eating pickles and to follow a clear liquid diet for a day or so and then advance her diet with soft bland foods as tolerated. Also advised her that if she get worse over the weekend she should go to the ED to be evaluated. Pt verbalized understanding.

## 2012-11-07 NOTE — Telephone Encounter (Signed)
Agree 

## 2012-11-13 ENCOUNTER — Ambulatory Visit (INDEPENDENT_AMBULATORY_CARE_PROVIDER_SITE_OTHER): Payer: No Typology Code available for payment source | Admitting: Gastroenterology

## 2012-11-13 ENCOUNTER — Encounter: Payer: Self-pay | Admitting: Gastroenterology

## 2012-11-13 VITALS — BP 133/85 | HR 100 | Temp 98.1°F | Ht 65.0 in | Wt 143.0 lb

## 2012-11-13 DIAGNOSIS — R112 Nausea with vomiting, unspecified: Secondary | ICD-10-CM | POA: Insufficient documentation

## 2012-11-13 DIAGNOSIS — R1011 Right upper quadrant pain: Secondary | ICD-10-CM

## 2012-11-13 NOTE — Assessment & Plan Note (Addendum)
Recurrent upper abdominal pain with recent N/V/D. Vomiting and diarrhea resolved and may have been viral gastroenteritis. She has h/o RE and gastritis and has been off of PPI due to lack of insurance. Suspect flare of GERD/gastritis.   Add PPI. Trial of pantoprazole 40mg  daily. Dexilant samples given until she can get the pantoprazole filled and in case there is a prior auth issue.  Call with persistent symptoms Colonoscopy when she is feeling better for screening purposes.

## 2012-11-13 NOTE — Progress Notes (Signed)
Primary Care Physician:  Alice Reichert, MD  Primary Gastroenterologist:  Roetta Sessions, MD    Chief Complaint  Patient presents with  . Follow-up    HPI:  Susan Baker is a 52 y.o. female here for f/u of abdominal pain, n/v/d. Last seen in 12/2010. She's had an extensive evaluation, including abdominal ultrasound, HIDA scan, MRI of the abdomen to follow up with abnormalities on abdominal ultrasound. See prior reports for details. Essentially she has benign findings and fatty liver. GB w/u was negative.   In between pain management and PCP due to insurance change. Not on PPI since last fall due to lack of insurance.   One month ago started having problems again. Initially thought she had virus. Increased belching. Diarrhea for three days, vomiting off/on. Bad nausea worse with movement. For few days. Lost 9 pounds in one week. Put back on couple of pounds since then. Still with spells of nausea. Some improvement in appetite but still eating soft diet. No heartburn. RUQ pain, sometimes worse with heavy foods. Ok with bland diet. Increased stress related to boyfriend's illness. No NSAIDs. No melena, brbpr.   Current Outpatient Prescriptions  Medication Sig Dispense Refill  . buPROPion (WELLBUTRIN XL) 300 MG 24 hr tablet Take 300 mg by mouth daily.      Marland Kitchen gabapentin (NEURONTIN) 100 MG capsule Take 100 mg by mouth 3 (three) times daily.      Marland Kitchen HYDROcodone-acetaminophen (NORCO) 10-325 MG per tablet every 6 (six) hours as needed.       . NUCYNTA 50 MG TABS Take 50 mg by mouth every 6 (six) hours as needed.       Marland Kitchen DEXILANT 60 MG capsule TAKE ONE CAPSULE BY MOUTH EVERY DAY  30 capsule  11   No current facility-administered medications for this visit.    Allergies as of 11/13/2012 - Review Complete 11/13/2012  Allergen Reaction Noted  . Cymbalta (duloxetine hcl) Other (See Comments) 02/22/2012  . Daypro (oxaprozin) Other (See Comments) 11/07/2010  . Trazodone and nefazodone Other (See  Comments) 02/22/2012    Past Medical History  Diagnosis Date  . Fibromyalgia   . Uterine cancer   . PUD (peptic ulcer disease)     bleeding PUD per patient, remote, On Daypro  . GERD (gastroesophageal reflux disease)     egd with RE per patient, remote  . Bulging disc     cervical, Pain Clinic    Past Surgical History  Procedure Laterality Date  . Hysterectomy for uterine cancer      partial  . Ectopic pregnancy surgery      twice  . Esophagogastroduodenoscopy  12/01/2010    mild distal ERE, antral erosions due to NSAIDS, no H.Pylori    Family History  Problem Relation Age of Onset  . Cirrhosis Father     etoh, died with liver cancer  . Cirrhosis Paternal Uncle     multiple, etoh  . Colon cancer Neg Hx   . Pancreatic cancer Paternal Grandmother     History   Social History  . Marital Status: Divorced    Spouse Name: N/A    Number of Children: 1  . 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 Smoking status: Current Every Day Smoker -- 1.00 packs/day    Types: Cigarettes  . Smokeless tobacco: Not on file  . Alcohol Use: Yes     Comment: One drink ever  3 weeks  . Drug Use: No  . Sexually Active: Not on file   Other Topics Concern  . Not on file   Social History Narrative  . No narrative on file      ROS:  General: Negative for anorexia, weight loss, fever, chills, fatigue, weakness.see hpi Eyes: Negative for vision changes.  ENT: Negative for hoarseness, difficulty swallowing , nasal congestion. CV: Negative for chest pain, angina, palpitations, dyspnea on exertion, peripheral edema.  Respiratory: Negative for dyspnea at rest, dyspnea on exertion, cough, sputum, wheezing.  GI: See history of present illness. GU:  Negative for dysuria, hematuria, urinary incontinence, urinary frequency, nocturnal urination.  MS: chronic back/neck pain.  Derm: Negative for rash or itching.  Neuro:  Negative for weakness, abnormal sensation, seizure, frequent headaches, memory loss, confusion.  Psych: + for anxiety, depression. Neg for suicidal ideation, hallucinations.  Endo: Negative for unusual weight change.  Heme: Negative for bruising or bleeding. Allergy: Negative for rash or hives.    Physical Examination:  BP 133/85  Temp(Src) 98.1 F (36.7 C) (Oral)  Ht 5\' 5"  (1.651 m)  Wt 143 lb (64.864 kg)  BMI 23.8 kg/m2   General: Well-nourished, well-developed in no acute distress.  Head: Normocephalic, atraumatic.   Eyes: Conjunctiva pink, no icterus. Mouth: Oropharyngeal mucosa moist and pink , no lesions erythema or exudate. Neck: Supple without thyromegaly, masses, or lymphadenopathy.  Lungs: Clear to auscultation bilaterally.  Heart: Regular rate and rhythm, no murmurs rubs or gallops.  Abdomen: Bowel sounds are normal, mild epig tenderness, nondistended, no hepatosplenomegaly or masses, no abdominal bruits or    hernia , no rebound or guarding.   Rectal: not peformed Extremities: No lower extremity edema. No clubbing or deformities.  Neuro: Alert and oriented x 4 , grossly normal neurologically.  Skin: Warm and dry, no rash or jaundice.   Psych: Alert and cooperative, normal mood and affect.

## 2012-11-13 NOTE — Progress Notes (Signed)
Cc PCP 

## 2012-11-13 NOTE — Patient Instructions (Addendum)
1. Prescription for pantoprazole 40 mg daily sent to your pharmacy. 2. Samples of Dexilant given for you to take one daily until you get your prescription filled for pantoprazole. 3. Call if you have ongoing symptoms. 4. You should consider having your first ever colonoscopy this year when you are feeling better.

## 2013-01-22 ENCOUNTER — Other Ambulatory Visit: Payer: Self-pay | Admitting: Adult Health

## 2013-01-22 DIAGNOSIS — Z09 Encounter for follow-up examination after completed treatment for conditions other than malignant neoplasm: Secondary | ICD-10-CM

## 2013-01-30 ENCOUNTER — Inpatient Hospital Stay (HOSPITAL_COMMUNITY): Admission: RE | Admit: 2013-01-30 | Payer: No Typology Code available for payment source | Source: Ambulatory Visit

## 2013-02-18 ENCOUNTER — Other Ambulatory Visit (HOSPITAL_COMMUNITY)
Admission: RE | Admit: 2013-02-18 | Discharge: 2013-02-18 | Disposition: A | Discharge status: Home / Self Care | Payer: No Typology Code available for payment source | Source: Ambulatory Visit | Attending: Adult Health | Admitting: Adult Health

## 2013-02-18 ENCOUNTER — Encounter: Payer: Self-pay | Admitting: Adult Health

## 2013-02-18 ENCOUNTER — Ambulatory Visit (INDEPENDENT_AMBULATORY_CARE_PROVIDER_SITE_OTHER): Payer: No Typology Code available for payment source | Admitting: Adult Health

## 2013-02-18 ENCOUNTER — Ambulatory Visit (HOSPITAL_COMMUNITY)
Admission: RE | Admit: 2013-02-18 | Discharge: 2013-02-18 | Disposition: A | Discharge status: Home / Self Care | Payer: No Typology Code available for payment source | Source: Ambulatory Visit | Attending: Adult Health | Admitting: Adult Health

## 2013-02-18 VITALS — BP 112/78 | HR 72 | Ht 63.0 in | Wt 142.4 lb

## 2013-02-18 DIAGNOSIS — Z1151 Encounter for screening for human papillomavirus (HPV): Secondary | ICD-10-CM | POA: Insufficient documentation

## 2013-02-18 DIAGNOSIS — K760 Fatty (change of) liver, not elsewhere classified: Secondary | ICD-10-CM

## 2013-02-18 DIAGNOSIS — Z8542 Personal history of malignant neoplasm of other parts of uterus: Secondary | ICD-10-CM

## 2013-02-18 DIAGNOSIS — Z09 Encounter for follow-up examination after completed treatment for conditions other than malignant neoplasm: Secondary | ICD-10-CM

## 2013-02-18 DIAGNOSIS — F32A Depression, unspecified: Secondary | ICD-10-CM

## 2013-02-18 DIAGNOSIS — Z1231 Encounter for screening mammogram for malignant neoplasm of breast: Secondary | ICD-10-CM | POA: Insufficient documentation

## 2013-02-18 DIAGNOSIS — IMO0002 Reserved for concepts with insufficient information to code with codable children: Secondary | ICD-10-CM

## 2013-02-18 DIAGNOSIS — F329 Major depressive disorder, single episode, unspecified: Secondary | ICD-10-CM | POA: Insufficient documentation

## 2013-02-18 DIAGNOSIS — M1990 Unspecified osteoarthritis, unspecified site: Secondary | ICD-10-CM | POA: Insufficient documentation

## 2013-02-18 DIAGNOSIS — Z1212 Encounter for screening for malignant neoplasm of rectum: Secondary | ICD-10-CM

## 2013-02-18 DIAGNOSIS — Z01419 Encounter for gynecological examination (general) (routine) without abnormal findings: Secondary | ICD-10-CM

## 2013-02-18 DIAGNOSIS — R87810 Cervical high risk human papillomavirus (HPV) DNA test positive: Secondary | ICD-10-CM | POA: Insufficient documentation

## 2013-02-18 DIAGNOSIS — M797 Fibromyalgia: Secondary | ICD-10-CM

## 2013-02-18 HISTORY — DX: Depression, unspecified: F32.A

## 2013-02-18 HISTORY — DX: Major depressive disorder, single episode, unspecified: F32.9

## 2013-02-18 LAB — COMPREHENSIVE METABOLIC PANEL
Alkaline Phosphatase: 82 U/L (ref 39–117)
BUN: 13 mg/dL (ref 6–23)
CO2: 32 mEq/L (ref 19–32)
Creat: 0.87 mg/dL (ref 0.50–1.10)
Glucose, Bld: 74 mg/dL (ref 70–99)
Sodium: 141 mEq/L (ref 135–145)
Total Bilirubin: 0.4 mg/dL (ref 0.3–1.2)
Total Protein: 6.4 g/dL (ref 6.0–8.3)

## 2013-02-18 LAB — HEMOCCULT GUIAC POC 1CARD (OFFICE): Fecal Occult Blood, POC: NEGATIVE

## 2013-02-18 LAB — LIPID PANEL
Cholesterol: 144 mg/dL (ref 0–200)
HDL: 46 mg/dL (ref >39)
Total CHOL/HDL Ratio: 3.1 Ratio
Triglycerides: 93 mg/dL (ref <150)
VLDL: 19 mg/dL (ref 0–40)

## 2013-02-18 LAB — CBC
HCT: 37.7 % (ref 36.0–46.0)
Hemoglobin: 13.3 g/dL (ref 12.0–15.0)
MCH: 32.5 pg (ref 26.0–34.0)
MCHC: 35.3 g/dL (ref 30.0–36.0)
MCV: 92.2 fL (ref 78.0–100.0)

## 2013-02-18 LAB — TSH: TSH: 1.691 u[IU]/mL (ref 0.350–4.500)

## 2013-02-18 NOTE — Patient Instructions (Addendum)
Physical in 1 year Mammogram yearly Colonoscopy advised now

## 2013-02-18 NOTE — Progress Notes (Signed)
Patient ID: Susan Baker, female   DOB: 1961-06-02, 52 y.o.   MRN: [id number] History of Present Illness: Susan Baker is a 52 year old white female single in for pap and physical.She is currently not working due to bulging disc in neck and back.   Current Medications, Allergies, Past Medical History, Past Surgical History, Family History and Social History were reviewed in [employer] record.     Review of Systems: Patient denies any headaches, blurred vision, shortness of breath, chest pain, abdominal pain, problems with bowel movements, urination, or intercourse. She has not had sex in 2 years partner had diabetes,she has chronic pain and sees pain clinic in [place].No mood changes of late.    Physical Exam:BP 112/78  Pulse 72  Ht 5\' 3"  (1.6 m)  Wt 142 lb 6.4 oz (64.592 kg)  BMI 25.23 kg/m2 General:  Well developed, well nourished, no acute distress Skin:  Warm and dry Neck:  Midline trachea, normal thyroid Lungs; Clear to auscultation bilaterally Breast:  No dominant palpable mass, retraction, or nipple discharge Cardiovascular: Regular rate and rhythm Abdomen:  Soft, non tender, no hepatosplenomegaly Pelvic:  External genitalia is normal in appearance.  The vagina is normal in appearance. The cervix and uterus are absent.Pap performed with HPV. No adnexal masses or tenderness noted. Rectal: Good sphincter tone, no polyps, or hemorrhoids felt.  Hemoccult negative. Extremities:  No swelling or varicosities noted Psych:  Alert and cooperative and seems happy   Impression: Yearly gyn exam History of uterine cancer Fatty liver Fibromyalgia Depression Osteoarthritis  Bulging discs    Plan: Physical in 1 year  Mammogram yearly Colonoscopy advised now, talk with Dr Jena Gauss Check CBC,CMP,TSH and lipids

## 2013-02-19 ENCOUNTER — Telehealth: Payer: Self-pay | Admitting: Adult Health

## 2013-02-19 NOTE — Telephone Encounter (Signed)
Pt aware labs normal will send copy to Dr Renard Matter Adline Potter, NP 02/19/2013 1:39 PM

## 2013-02-24 ENCOUNTER — Telehealth: Payer: Self-pay | Admitting: Adult Health

## 2013-02-24 NOTE — Telephone Encounter (Signed)
Pt aware of abnormal pap and need for colpo, will make appt 

## 2013-03-11 ENCOUNTER — Encounter: Payer: Self-pay | Admitting: Obstetrics & Gynecology

## 2013-03-11 ENCOUNTER — Ambulatory Visit (INDEPENDENT_AMBULATORY_CARE_PROVIDER_SITE_OTHER): Payer: No Typology Code available for payment source | Admitting: Obstetrics & Gynecology

## 2013-03-11 VITALS — BP 100/70 | Ht 63.0 in | Wt 139.0 lb

## 2013-03-11 DIAGNOSIS — R87810 Cervical high risk human papillomavirus (HPV) DNA test positive: Secondary | ICD-10-CM

## 2013-03-11 DIAGNOSIS — R87612 Low grade squamous intraepithelial lesion on cytologic smear of cervix (LGSIL): Secondary | ICD-10-CM

## 2013-03-11 DIAGNOSIS — N890 Mild vaginal dysplasia: Secondary | ICD-10-CM | POA: Insufficient documentation

## 2013-03-11 DIAGNOSIS — IMO0002 Reserved for concepts with insufficient information to code with codable children: Secondary | ICD-10-CM

## 2013-03-11 DIAGNOSIS — D069 Carcinoma in situ of cervix, unspecified: Secondary | ICD-10-CM

## 2013-03-11 NOTE — Progress Notes (Signed)
Patient ID: Susan Baker, female   DOB: [birth year], 52 y.o.   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 This necessitated going forward with a hysterectomy at that time  Given her age this is much more plausible and likely than uterine cancer even now having report for pathology re counting this from the patient She is sitting here listening to my dictation to ensure that I am accurately reflecting what happened  Presently her Pap smear recently obtained of the vaginal cuff revealed 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

## 2013-06-23 ENCOUNTER — Emergency Department (HOSPITAL_COMMUNITY)
Admission: EM | Admit: 2013-06-23 | Discharge: 2013-06-23 | Disposition: A | Discharge status: Home / Self Care | Payer: No Typology Code available for payment source | Attending: Emergency Medicine | Admitting: Emergency Medicine

## 2013-06-23 ENCOUNTER — Encounter (HOSPITAL_COMMUNITY): Payer: Self-pay | Admitting: Emergency Medicine

## 2013-06-23 DIAGNOSIS — F3289 Other specified depressive episodes: Secondary | ICD-10-CM | POA: Insufficient documentation

## 2013-06-23 DIAGNOSIS — F411 Generalized anxiety disorder: Secondary | ICD-10-CM | POA: Insufficient documentation

## 2013-06-23 DIAGNOSIS — Z79899 Other long term (current) drug therapy: Secondary | ICD-10-CM | POA: Insufficient documentation

## 2013-06-23 DIAGNOSIS — Z90710 Acquired absence of both cervix and uterus: Secondary | ICD-10-CM | POA: Insufficient documentation

## 2013-06-23 DIAGNOSIS — Z8739 Personal history of other diseases of the musculoskeletal system and connective tissue: Secondary | ICD-10-CM | POA: Insufficient documentation

## 2013-06-23 DIAGNOSIS — F329 Major depressive disorder, single episode, unspecified: Secondary | ICD-10-CM | POA: Insufficient documentation

## 2013-06-23 DIAGNOSIS — F17200 Nicotine dependence, unspecified, uncomplicated: Secondary | ICD-10-CM | POA: Insufficient documentation

## 2013-06-23 DIAGNOSIS — IMO0001 Reserved for inherently not codable concepts without codable children: Secondary | ICD-10-CM | POA: Insufficient documentation

## 2013-06-23 DIAGNOSIS — K219 Gastro-esophageal reflux disease without esophagitis: Secondary | ICD-10-CM | POA: Insufficient documentation

## 2013-06-23 DIAGNOSIS — Z8542 Personal history of malignant neoplasm of other parts of uterus: Secondary | ICD-10-CM | POA: Insufficient documentation

## 2013-06-23 DIAGNOSIS — M797 Fibromyalgia: Secondary | ICD-10-CM

## 2013-06-23 DIAGNOSIS — Z8711 Personal history of peptic ulcer disease: Secondary | ICD-10-CM | POA: Insufficient documentation

## 2013-06-23 NOTE — ED Notes (Signed)
Pt reports woke up 3 weeks ago with large bruise to left groin and upper thigh.  Pt says bruise is gone but still has pain in groin and thigh.  Denies injury.

## 2013-06-23 NOTE — ED Provider Notes (Signed)
Medical screening examination/treatment/procedure(s) were performed by non-physician practitioner and as supervising physician I was immediately available for consultation/collaboration.  EKG Interpretation   None         Choua Chalker L Daulton Harbaugh, MD 06/23/13 1523 

## 2013-06-23 NOTE — ED Provider Notes (Signed)
CSN: [id number]     Arrival date & time 06/23/13  1245 History   First MD Initiated Contact with Patient 06/23/13 1342     Chief Complaint  Patient presents with  . Groin Pain   (Consider location/radiation/quality/duration/timing/severity/associated sxs/prior Treatment) Patient is a 52 y.o. female presenting with groin pain. The history is provided by the patient.  Groin Pain The current episode started 1 to 4 weeks ago. The problem occurs intermittently. The problem has been unchanged. Associated symptoms include myalgias. Pertinent negatives include no abdominal pain, arthralgias, chest pain, coughing, fatigue, fever, joint swelling, neck pain or rash. The symptoms are aggravated by standing and walking. She has tried acetaminophen for the symptoms. The treatment provided mild relief.    Past Medical History  Diagnosis Date  . Fibromyalgia   . Uterine cancer   . PUD (peptic ulcer disease)     bleeding PUD per patient, remote, On Daypro  . GERD (gastroesophageal reflux disease)     egd with RE per patient, remote  . Bulging disc     cervical, Pain Clinic  . Osteoarthritis (arthritis due to wear and tear of joints)   . Depression 02/18/2013  . Abnormal Pap smear    Past Surgical History  Procedure Laterality Date  . Hysterectomy for uterine cancer      partial  . Ectopic pregnancy surgery      twice  . Esophagogastroduodenoscopy  12/01/2010    mild distal ERE, antral erosions due to NSAIDS, no H.Pylori   Family History  Problem Relation Age of Onset  . Cirrhosis Father     etoh, died with liver cancer  . Cirrhosis Paternal Uncle     multiple, etoh  . Colon cancer Neg Hx   . Pancreatic cancer Paternal Grandmother   . Kidney failure Mother   . Diabetes Mother    History  Substance Use Topics  . Smoking status: Current Every Day Smoker -- 0.50 packs/day for 30 years    Types: Cigarettes  . Smokeless tobacco: Never Used  . Alcohol Use: No   OB History   Grav Para  Term Preterm Abortions TAB SAB Ect Mult Living   4 2   2   2  2      Review of Systems  Constitutional: Negative for fever, activity change and fatigue.       All ROS Neg except as noted in HPI  HENT: Negative for nosebleeds.   Eyes: Negative for photophobia and discharge.  Respiratory: Negative for cough, shortness of breath and wheezing.   Cardiovascular: Negative for chest pain and palpitations.  Gastrointestinal: Negative for abdominal pain and blood in stool.  Genitourinary: Negative for dysuria, frequency and hematuria.  Musculoskeletal: Positive for myalgias. Negative for arthralgias, back pain, joint swelling and neck pain.  Skin: Negative.  Negative for rash.  Neurological: Negative for dizziness, seizures and speech difficulty.  Psychiatric/Behavioral: Negative for hallucinations and confusion.    Allergies  Cymbalta; Daypro; and Trazodone and nefazodone  Home Medications   Current Outpatient Rx  Name  Route  Sig  Dispense  Refill  . acetaminophen (TYLENOL) 500 MG tablet   Oral   Take 1,000 mg by mouth every 6 (six) hours as needed for mild pain.         Marland Kitchen buPROPion (WELLBUTRIN XL) 300 MG 24 hr tablet   Oral   Take 300 mg by mouth daily.         Marland Kitchen FLUoxetine (PROZAC) 40 MG capsule  Oral   Take 40 mg by mouth 2 (two) times daily.          . NUCYNTA 50 MG TABS   Oral   Take 50 mg by mouth every 4 (four) hours as needed.          . pregabalin (LYRICA) 75 MG capsule   Oral   Take 75 mg by mouth 3 (three) times daily.         Marland Kitchen DEXILANT 60 MG capsule      TAKE ONE CAPSULE BY MOUTH EVERY DAY   30 capsule   11    BP 131/84  Pulse 103  Temp(Src) 98 F (36.7 C) (Oral)  Resp 20  Ht 5\' 3"  (1.6 m)  Wt 138 lb (62.596 kg)  BMI 24.45 kg/m2  SpO2 99% Physical Exam  Nursing note and vitals reviewed. Constitutional: She is oriented to person, place, and time. She appears well-developed and well-nourished.  Non-toxic appearance.  HENT:  Head:  Normocephalic.  Right Ear: Tympanic membrane and external ear normal.  Left Ear: Tympanic membrane and external ear normal.  Eyes: EOM and lids are normal. Pupils are equal, round, and reactive to light.  Neck: Normal range of motion. Neck supple. Carotid bruit is not present.  Cardiovascular: Normal rate, regular rhythm, normal heart sounds, intact distal pulses and normal pulses.   Pulmonary/Chest: Breath sounds normal. No respiratory distress.  Abdominal: Soft. Bowel sounds are normal. There is no tenderness. There is no guarding.  Musculoskeletal: Normal range of motion.  There is full range of motion of the left hip, knee, ankle, and toes. There is no bruise noted of the entire left lower extremity. There is soreness to the dorsum of the left thigh, extending to the medial aspect of the upper left calf. There is no increased redness. The area is not hot to touch. There is a negative Homans signs. Distal pulses are 2+ and symmetrical. The Achilles tendon is intact.  Lymphadenopathy:       Head (right side): No submandibular adenopathy present.       Head (left side): No submandibular adenopathy present.    She has no cervical adenopathy.  Neurological: She is alert and oriented to person, place, and time. She has normal strength. No cranial nerve deficit or sensory deficit.  Skin: Skin is warm and dry.  Psychiatric: Her speech is normal. Her mood appears anxious.    ED Course  Procedures (including critical care time) Labs Review Labs Reviewed - No data to display Imaging Review No results found.  EKG Interpretation   None       MDM  No diagnosis found. *I have reviewed nursing notes, vital signs, and all appropriate lab and imaging results for this patient.**  Patient states she will call approximately 3-4 weeks ago with a large bruise at the left upper thigh extending into the groin. She states that the bruise has now resolved, but she continues to have soreness in this  area. The examination at this time reveals no bruise, there is soreness present but no functional abnormality. There is no neurovascular changes appreciated at this time.  I suspect the patient has an occult bruise to the thigh area. This is probably followed by an exacerbation of her fibromyalgia. The patient is ambulatory at this time without problem. I find no evidence for deep vein thrombosis. Patient advised of the findings. Advised to continue her Tylenol or ibuprofen for soreness. To see her primary physician or return to  the emergency department if any deterioration in her condition.  Kathie Dike, PA-C 06/23/13 1452

## 2014-02-24 ENCOUNTER — Ambulatory Visit (INDEPENDENT_AMBULATORY_CARE_PROVIDER_SITE_OTHER): Payer: No Typology Code available for payment source | Admitting: Adult Health

## 2014-02-24 ENCOUNTER — Encounter: Payer: Self-pay | Admitting: Adult Health

## 2014-02-24 ENCOUNTER — Other Ambulatory Visit (HOSPITAL_COMMUNITY)
Admission: RE | Admit: 2014-02-24 | Discharge: 2014-02-24 | Disposition: A | Discharge status: Home / Self Care | Payer: No Typology Code available for payment source | Source: Ambulatory Visit | Attending: Adult Health | Admitting: Adult Health

## 2014-02-24 VITALS — BP 108/76 | Ht 62.25 in | Wt 134.0 lb

## 2014-02-24 DIAGNOSIS — Z113 Encounter for screening for infections with a predominantly sexual mode of transmission: Secondary | ICD-10-CM

## 2014-02-24 DIAGNOSIS — Z01419 Encounter for gynecological examination (general) (routine) without abnormal findings: Secondary | ICD-10-CM

## 2014-02-24 DIAGNOSIS — Z8742 Personal history of other diseases of the female genital tract: Secondary | ICD-10-CM | POA: Insufficient documentation

## 2014-02-24 DIAGNOSIS — Z780 Asymptomatic menopausal state: Secondary | ICD-10-CM

## 2014-02-24 DIAGNOSIS — Z139 Encounter for screening, unspecified: Secondary | ICD-10-CM

## 2014-02-24 DIAGNOSIS — Z1151 Encounter for screening for human papillomavirus (HPV): Secondary | ICD-10-CM | POA: Insufficient documentation

## 2014-02-24 DIAGNOSIS — R87810 Cervical high risk human papillomavirus (HPV) DNA test positive: Secondary | ICD-10-CM | POA: Insufficient documentation

## 2014-02-24 DIAGNOSIS — Z1212 Encounter for screening for malignant neoplasm of rectum: Secondary | ICD-10-CM

## 2014-02-24 DIAGNOSIS — Z124 Encounter for screening for malignant neoplasm of cervix: Secondary | ICD-10-CM | POA: Insufficient documentation

## 2014-02-24 HISTORY — DX: Asymptomatic menopausal state: Z78.0

## 2014-02-24 HISTORY — DX: Personal history of other diseases of the female genital tract: Z87.42

## 2014-02-24 LAB — HEMOCCULT GUIAC POC 1CARD (OFFICE): Fecal Occult Blood, POC: NEGATIVE

## 2014-02-24 NOTE — Patient Instructions (Signed)
Physical in 1 yer mammogram yearly Labs with PCP Colonoscopy advised, referred to Dr Oneida Alar DEXA 8/31 at 10 am  Menopause Menopause is the normal time of life when menstrual periods stop completely. Menopause is complete when you have missed 12 consecutive menstrual periods. It usually occurs between the ages of 66 years and 10 years. Very rarely does a woman develop menopause before the age of 43 years. At menopause, your ovaries stop producing the female hormones estrogen and progesterone. This can cause undesirable symptoms and also affect your health. Sometimes the symptoms may occur 4-5 years before the menopause begins. There is no relationship between menopause and:  Oral contraceptives.  Number of children you had.  Race.  The age your menstrual periods started (menarche). Heavy smokers and very thin women may develop menopause earlier in life. CAUSES  The ovaries stop producing the female hormones estrogen and progesterone.  Other causes include:  Surgery to remove both ovaries.  The ovaries stop functioning for no known reason.  Tumors of the pituitary gland in the brain.  Medical disease that affects the ovaries and hormone production.  Radiation treatment to the abdomen or pelvis.  Chemotherapy that affects the ovaries. SYMPTOMS   Hot flashes.  Night sweats.  Decrease in sex drive.  Vaginal dryness and thinning of the vagina causing painful intercourse.  Dryness of the skin and developing wrinkles.  Headaches.  Tiredness.  Irritability.  Memory problems.  Weight gain.  Bladder infections.  Hair growth of the face and chest.  Infertility. More serious symptoms include:  Loss of bone (osteoporosis) causing breaks (fractures).  Depression.  Hardening and narrowing of the arteries (atherosclerosis) causing heart attacks and strokes. DIAGNOSIS   When the menstrual periods have stopped for 12 straight months.  Physical exam.  Hormone  studies of the blood. TREATMENT  There are many treatment choices and nearly as many questions about them. The decisions to treat or not to treat menopausal changes is an individual choice made with your health care provider. Your health care provider can discuss the treatments with you. Together, you can decide which treatment will work best for you. Your treatment choices may include:   Hormone therapy (estrogen and progesterone).  Non-hormonal medicines.  Treating the individual symptoms with medicine (for example antidepressants for depression).  Herbal medicines that may help specific symptoms.  Counseling by a psychiatrist or psychologist.  Group therapy.  Lifestyle changes including:  Eating healthy.  Regular exercise.  Limiting caffeine and alcohol.  Stress management and meditation.  No treatment. HOME CARE INSTRUCTIONS   Take the medicine your health care provider gives you as directed.  Get plenty of sleep and rest.  Exercise regularly.  Eat a diet that contains calcium (good for the bones) and soy products (acts like estrogen hormone).  Avoid alcoholic beverages.  Do not smoke.  If you have hot flashes, dress in layers.  Take supplements, calcium, and vitamin D to strengthen bones.  You can use over-the-counter lubricants or moisturizers for vaginal dryness.  Group therapy is sometimes very helpful.  Acupuncture may be helpful in some cases. SEEK MEDICAL CARE IF:   You are not sure you are in menopause.  You are having menopausal symptoms and need advice and treatment.  You are still having menstrual periods after age 89 years.  You have pain with intercourse.  Menopause is complete (no menstrual period for 12 months) and you develop vaginal bleeding.  You need a referral to a specialist (gynecologist, psychiatrist, or  psychologist) for treatment. SEEK IMMEDIATE MEDICAL CARE IF:   You have severe depression.  You have excessive vaginal  bleeding.  You fell and think you have a broken bone.  You have pain when you urinate.  You develop leg or chest pain.  You have a fast pounding heart beat (palpitations).  You have severe headaches.  You develop vision problems.  You feel a lump in your breast.  You have abdominal pain or severe indigestion. Document Released: 09/09/2003 Document Revised: 02/19/2013 Document Reviewed: 01/16/2013 [facility] Patient Information 2015 [place], [place]. This information is not intended to replace advice given to you by your health care provider. Make sure you discuss any questions you have with your health care provider. [facility] [id number]
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
## 2014-02-25 LAB — RPR

## 2014-02-25 LAB — HIV ANTIBODY (ROUTINE TESTING W REFLEX): HIV: NONREACTIVE

## 2014-02-25 LAB — CYTOLOGY - PAP

## 2014-02-26 ENCOUNTER — Telehealth: Payer: Self-pay | Admitting: Adult Health

## 2014-02-26 LAB — HSV 2 ANTIBODY, IGG

## 2014-02-26 NOTE — Telephone Encounter (Signed)
Pt aware of labs  

## 2014-02-27 ENCOUNTER — Telehealth: Payer: Self-pay | Admitting: Adult Health

## 2014-02-27 NOTE — Telephone Encounter (Signed)
Left message to call about pap

## 2014-02-27 NOTE — Telephone Encounter (Signed)
Pt aware of pap and need for colpo, appt made

## 2014-03-02 ENCOUNTER — Ambulatory Visit (HOSPITAL_COMMUNITY)
Admission: RE | Admit: 2014-03-02 | Discharge: 2014-03-02 | Disposition: A | Discharge status: Home / Self Care | Payer: No Typology Code available for payment source | Source: Ambulatory Visit | Attending: Adult Health | Admitting: Adult Health

## 2014-03-02 DIAGNOSIS — Z780 Asymptomatic menopausal state: Secondary | ICD-10-CM | POA: Diagnosis present

## 2014-03-03 ENCOUNTER — Encounter: Payer: Self-pay | Admitting: Adult Health

## 2014-03-03 ENCOUNTER — Telehealth: Payer: Self-pay | Admitting: Adult Health

## 2014-03-03 DIAGNOSIS — M810 Age-related osteoporosis without current pathological fracture: Secondary | ICD-10-CM

## 2014-03-03 HISTORY — DX: Age-related osteoporosis without current pathological fracture: M81.0

## 2014-03-03 MED ORDER — IBANDRONATE SODIUM 150 MG PO TABS: 150.0000 mg | ORAL_TABLET | ORAL | Status: DC

## 2014-03-03 NOTE — Telephone Encounter (Signed)
Pt aware of dexa scan, take 1200mg  of calcium daily and 800 IU vitamin D and will Rx Boniva 150 mg 1 monthly and send copy to Dr Everette Rank

## 2014-03-04 ENCOUNTER — Other Ambulatory Visit: Payer: Self-pay | Admitting: Obstetrics and Gynecology

## 2014-03-04 ENCOUNTER — Other Ambulatory Visit: Payer: Self-pay | Admitting: Adult Health

## 2014-03-04 ENCOUNTER — Ambulatory Visit (INDEPENDENT_AMBULATORY_CARE_PROVIDER_SITE_OTHER): Payer: No Typology Code available for payment source | Admitting: Obstetrics and Gynecology

## 2014-03-04 ENCOUNTER — Encounter: Payer: Self-pay | Admitting: Obstetrics and Gynecology

## 2014-03-04 VITALS — BP 128/90 | Ht 62.0 in | Wt 134.0 lb

## 2014-03-04 DIAGNOSIS — R87629 Unspecified abnormal cytological findings in specimens from vagina: Secondary | ICD-10-CM

## 2014-03-04 DIAGNOSIS — N870 Mild cervical dysplasia: Secondary | ICD-10-CM

## 2014-03-04 MED ORDER — ALENDRONATE SODIUM 70 MG PO TABS: 70.0000 mg | ORAL_TABLET | ORAL | Status: DC

## 2014-03-04 MED ORDER — ESTROGENS, CONJUGATED 0.625 MG/GM VA CREA: 0.2500 | TOPICAL_CREAM | VAGINAL | Status: DC

## 2014-03-04 NOTE — Patient Instructions (Addendum)
Colposcopy, Care After Refer to this sheet in the next few weeks. These instructions provide you with information on caring for yourself after your procedure. Your health care provider may also give you more specific instructions. Your treatment has been planned according to current medical practices, but problems sometimes occur. Call your health care provider if you have any problems or questions after your procedure. WHAT TO EXPECT AFTER THE PROCEDURE  After your procedure, it is typical to have the following:  Cramping. This often goes away in a few minutes.  Soreness. This may last for 2 days.  Lightheadedness. Lie down for a few minutes if this occurs. You may also have some bleeding or dark discharge for a few days. You may need to wear a sanitary pad during this time. HOME CARE INSTRUCTIONS  Avoid sex, douching, and using tampons for 3 days or as directed by your health care provider.  Only take over-the-counter or prescription medicines as directed by your health care provider. Do not take aspirin because it can cause bleeding.  Continue to take birth control pills if you are on them.  Not all test results are available during your visit. If your test results are not back during the visit, make an appointment with your health care provider to find out the results. Do not assume everything is normal if you have not heard from your health care provider or the medical facility. It is important for you to follow up on all of your test results.  Follow your health care provider's advice regarding activity, follow-up visits, and follow-up Pap tests. SEEK MEDICAL CARE IF:  You develop a rash.  You have problems with your medicine. SEEK IMMEDIATE MEDICAL CARE IF:  You are bleeding heavily or are passing blood clots.  You have a fever.  You have abnormal vaginal discharge.  You are having cramps that do not go away after taking your pain medicine.  You feel lightheaded, dizzy, or  faint.  You have stomach pain. Document Released: 04/09/2013 Document Reviewed: 04/09/2013 [facility] Patient Information 2015 [place], [place]. This information is not intended to replace advice given to you by your health care provider. Make sure you discuss any questions you have with your health care provider.   Premarin, Vaginal Estrogen cream, 6 weeks course, 3 times a week. This is to be used prior to future pap smears

## 2014-03-04 NOTE — Addendum Note (Signed)
Addended by: Farley Ly on: 03/04/2014 11:34 AM   Modules accepted: Orders

## 2014-03-04 NOTE — Progress Notes (Signed)
GYNECOLOGY CLINIC COLPOSCOPY PROCEDURE NOTE  53 y.o. [name] here for colposcopy for low-grade squamous intraepithelial neoplasia (LGSIL - encompassing HPV,mild dysplasia,CIN I) pap smear on 02/24/14. Discussed role for HPV in cervical dysplasia, need for surveillance.  Susan Baker here today for a colposcopy. Susan Baker had an abnormal pap on 02/24/14. Susan Baker has had a hysterectomy. Susan Baker states that she has a hx of Cervix CA. She states that her OB-GYN recommended that she have a [facility] biopsy, following the results from that she had a vaginal hysterectomy the next day. She states that she had a Colposcopy procedure done last year with Dr. Elonda Husky. She denies being on any hormone therapies.   Patient given informed consent, signed copy in the chart, time out was performed.  Placed in lithotomy position. Cervix viewed with speculum and colposcope after application of acetic acid.   Colposcopy adequate? Yes  no visible lesions, but some bleeding from right end of old hyst scar at 9 oclock , either scar disruption or abnormality, probably scar disruptions. ; biopsies obtained.  ECC specimen obtained. All specimens were labelled and sent to pathology.  Pinch biopsy incomplete: Vaginal cuff biopsy at 9:00 at site of Bleeding present from scar tissue related to post hysterectomy.   Patient was given post procedure instructions.  Will follow up pathology and manage accordingly.  Routine preventative health maintenance measures emphasized.  A:  1. No progression of abnormalities or disease noted.   P:  1. Pap smears annually. 2. Premarin, Vaginal Estrogen cream, 6 weeks course, 3 times a week. This is to be used prior to future pap smears    This chart was scribed by Steva Colder, Medical Scribe, for Dr. Mallory Shirk on 03/04/14 at 10:30 AM. This chart was reviewed by Dr. Mallory Shirk for accuracy.

## 2014-03-12 ENCOUNTER — Encounter: Payer: Self-pay | Admitting: Obstetrics and Gynecology

## 2014-03-12 ENCOUNTER — Encounter: Payer: Self-pay | Admitting: Adult Health

## 2014-03-12 ENCOUNTER — Telehealth: Payer: Self-pay | Admitting: Obstetrics and Gynecology

## 2014-03-12 NOTE — Telephone Encounter (Signed)
Pt aware of need for pap in a year with cotesting for HPV.

## 2014-03-30 ENCOUNTER — Ambulatory Visit (INDEPENDENT_AMBULATORY_CARE_PROVIDER_SITE_OTHER): Payer: No Typology Code available for payment source | Admitting: Gastroenterology

## 2014-03-30 ENCOUNTER — Encounter: Payer: Self-pay | Admitting: Gastroenterology

## 2014-03-30 VITALS — BP 138/86 | HR 98 | Temp 98.4°F | Ht 62.0 in | Wt 134.0 lb

## 2014-03-30 DIAGNOSIS — R1011 Right upper quadrant pain: Secondary | ICD-10-CM

## 2014-03-30 DIAGNOSIS — K2100 Gastro-esophageal reflux disease with esophagitis, without bleeding: Secondary | ICD-10-CM

## 2014-03-30 MED ORDER — DEXLANSOPRAZOLE 60 MG PO CPDR: 60.0000 mg | DELAYED_RELEASE_CAPSULE | Freq: Every day | ORAL | Status: DC

## 2014-03-30 NOTE — Progress Notes (Signed)
Primary Care Physician:  Lanette Hampshire, MD  Primary Gastroenterologist:  Garfield Cornea, MD   Chief Complaint  Patient presents with  . Abdominal Pain    ulcer    HPI:  Susan Baker is a 53 y.o. female here for further evaluation of abdominal pain. Last seen in May 2014. History of abdominal pain, vomiting, diarrhea. Extensive evaluation in 2012 including abdominal ultrasound, HIDA scan, MRI abdomen to followup on abnormality seen on ultrasound. EGD showed mild distal erosive erosive reflux esophagitis, antral erosions due to NSAIDs, no H. Pylori.  Weight is down 9 pounds since last office visit. Complains of intermittent nausea and abdominal pain. Has been off of PPIs for several months. Complains of belching, right upper quadrant pain. Reports she is under a lot of stress. Consumes only one meal per day. Sometimes food helps her pain and other times worsens it. Denies vomiting. No melena rectal bleeding. Bowel movements regular. Has tried her mother's omeprazole 40 mg daily. Some improvement. Takes ibuprofen about twice per week for breakthrough pain. Chronic pain managed at pain management, on Nucynta.   Notable that we offered her colonoscopy at time of her last visit but she declined. Overdue for her first ever colonoscopy. Recent Hemoccult negative.  Current Outpatient Prescriptions  Medication Sig Dispense Refill  . buPROPion (WELLBUTRIN XL) 300 MG 24 hr tablet Take 300 mg by mouth daily.      Marland Kitchen FLUoxetine (PROZAC) 40 MG capsule Take 40 mg by mouth 2 (two) times daily.       . NUCYNTA 50 MG TABS Take 50 mg by mouth every 4 (four) hours as needed.       . pregabalin (LYRICA) 75 MG capsule Take 75 mg by mouth 3 (three) times daily.       No current facility-administered medications for this visit.    Allergies as of 03/30/2014 - Review Complete 03/30/2014  Allergen Reaction Noted  . Cymbalta [duloxetine hcl] Other (See Comments) 02/22/2012  . Daypro [oxaprozin] Other (See  Comments) 11/07/2010  . Trazodone and nefazodone Other (See Comments) 02/22/2012    Past Medical History  Diagnosis Date  . Fibromyalgia   . Uterine cancer   . PUD (peptic ulcer disease)     bleeding PUD per patient, remote, On Daypro  . GERD (gastroesophageal reflux disease)     egd with RE per patient, remote  . Bulging disc     cervical, Pain Clinic  . Osteoarthritis (arthritis due to wear and tear of joints)   . Depression 02/18/2013  . Abnormal Pap smear   . Vaginal Pap smear, abnormal   . History of abnormal cervical Pap smear 02/24/2014  . Postmenopausal 02/24/2014  . Osteoporosis, unspecified 03/03/2014    Past Surgical History  Procedure Laterality Date  . Hysterectomy for uterine cancer      partial  . Ectopic pregnancy surgery      twice  . Esophagogastroduodenoscopy  12/01/2010    mild distal ERE, antral erosions due to NSAIDS, no H.Pylori    Family History  Problem Relation Age of Onset  . Cirrhosis Father     etoh, died with liver cancer  . Cirrhosis Paternal Uncle     multiple, etoh  . Colon cancer Neg Hx   . Pancreatic cancer Paternal Grandmother   . Kidney failure Mother   . Diabetes Mother   . Other Brother     crohn's disease  . Hyperlipidemia Brother   . Fibromyalgia Daughter     History  Social History  . Marital Status: Divorced    Spouse Name: N/A    Number of Children: 1  . 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 Smoking status: Former Smoker -- 0.50 packs/day for 30 years    Types: Cigarettes  . Smokeless tobacco: Never Used  . Alcohol Use: No  . Drug Use: No  . Sexual Activity: Not Currently    Birth Control/ Protection: Surgical   Other Topics Concern  . Not on file   Social History Narrative  . No narrative on file      ROS:  General: Negative for anorexia, fever, chills, fatigue, weakness. Eyes: Negative for vision changes.  ENT: Negative for  hoarseness, difficulty swallowing , nasal congestion. CV: Negative for chest pain, angina, palpitations, dyspnea on exertion, peripheral edema.  Respiratory: Negative for dyspnea at rest, dyspnea on exertion, cough, sputum, wheezing.  GI: See history of present illness. GU:  Negative for dysuria, hematuria, urinary incontinence, urinary frequency, nocturnal urination.  MS: Negative for joint pain. Chronic low back pain.  Derm: Negative for rash or itching.  Neuro: Negative for weakness, abnormal sensation, seizure, frequent headaches, memory loss, confusion.  Psych: Negative for anxiety, depression, suicidal ideation, hallucinations.  Endo: Negative for unusual weight change.  Heme: Negative for bruising or bleeding. Allergy: Negative for rash or hives.    Physical Examination:  BP 138/86  Pulse 98  Temp(Src) 98.4 F (36.9 C) (Oral)  Ht 5\' 2"  (1.575 m)  Wt 134 lb (60.782 kg)  BMI 24.50 kg/m2   General: Well-nourished, well-developed in no acute distress.  Head: Normocephalic, atraumatic.   Eyes: Conjunctiva pink, no icterus. Mouth: Oropharyngeal mucosa moist and pink , no lesions erythema or exudate. Neck: Supple without thyromegaly, masses, or lymphadenopathy.  Lungs: Clear to auscultation bilaterally.  Heart: Regular rate and rhythm, no murmurs rubs or gallops.  Abdomen: Bowel sounds are normal, mild epigastric tenderness, nondistended, no hepatosplenomegaly or masses, no abdominal bruits or    hernia , no rebound or guarding.   Rectal: not performed Extremities: No lower extremity edema. No clubbing or deformities.  Neuro: Alert and oriented x 4 , grossly normal neurologically.  Skin: Warm and dry, no rash or jaundice.   Psych: Alert and cooperative, normal mood and affect.    Imaging Studies: Dg Bone Density  03/02/2014   EXAM: DG DXA BONE DENSITY STUDY  The Bone Mineral Densitometry hard-copy report (which includes all data, graphical display, and FRAX results when  applicable) has been sent directly to the ordering physician.  This report can also be obtained electronically by viewing images for this exam through the performing facility's EMR, or by logging directly into [employer].   Electronically Signed   By: Marin Olp M.D.   On: 03/02/2014 10:41

## 2014-03-30 NOTE — Patient Instructions (Signed)
1. Start Dexilant one daily before breakfast. Prescription sent to [facility]. Take rebate card with you. 2. Call in 2 weeks and let me know how you are doing. If not better, consider upper endoscopy at that time. 3. You are due for a screening colonoscopy. Let me know when you are ready.  Gastritis, Adult Gastritis is soreness and swelling (inflammation) of the lining of the stomach. Gastritis can develop as a sudden onset (acute) or long-term (chronic) condition. If gastritis is not treated, it can lead to stomach bleeding and ulcers. CAUSES  Gastritis occurs when the stomach lining is weak or damaged. Digestive juices from the stomach then inflame the weakened stomach lining. The stomach lining may be weak or damaged due to viral or bacterial infections. One common bacterial infection is the Helicobacter pylori infection. Gastritis can also result from excessive alcohol consumption, taking certain medicines, or having too much acid in the stomach.  SYMPTOMS  In some cases, there are no symptoms. When symptoms are present, they may include:  Pain or a burning sensation in the upper abdomen.  Nausea.  Vomiting.  An uncomfortable feeling of fullness after eating. DIAGNOSIS  Your caregiver may suspect you have gastritis based on your symptoms and a physical exam. To determine the cause of your gastritis, your caregiver may perform the following:  Blood or stool tests to check for the H pylori bacterium.  Gastroscopy. A thin, flexible tube (endoscope) is passed down the esophagus and into the stomach. The endoscope has a light and camera on the end. Your caregiver uses the endoscope to view the inside of the stomach.  Taking a tissue sample (biopsy) from the stomach to examine under a microscope. TREATMENT  Depending on the cause of your gastritis, medicines may be prescribed. If you have a bacterial infection, such as an H pylori infection, antibiotics may be given. If your gastritis is caused by  too much acid in the stomach, H2 blockers or antacids may be given. Your caregiver may recommend that you stop taking aspirin, ibuprofen, or other nonsteroidal anti-inflammatory drugs (NSAIDs). HOME CARE INSTRUCTIONS  Only take over-the-counter or prescription medicines as directed by your caregiver.  If you were given antibiotic medicines, take them as directed. Finish them even if you start to feel better.  Drink enough fluids to keep your urine clear or pale yellow.  Avoid foods and drinks that make your symptoms worse, such as:  Caffeine or alcoholic drinks.  Chocolate.  Peppermint or mint flavorings.  Garlic and onions.  Spicy foods.  Citrus fruits, such as oranges, lemons, or limes.  Tomato-based foods such as sauce, chili, salsa, and pizza.  Fried and fatty foods.  Eat small, frequent meals instead of large meals. SEEK IMMEDIATE MEDICAL CARE IF:   You have black or dark red stools.  You vomit blood or material that looks like coffee grounds.  You are unable to keep fluids down.  Your abdominal pain gets worse.  You have a fever.  You do not feel better after 1 week.  You have any other questions or concerns. MAKE SURE YOU:  Understand these instructions.  Will watch your condition.  Will get help right away if you are not doing well or get worse. Document Released: 06/13/2001 Document Revised: 12/19/2011 Document Reviewed: 08/02/2011 [facility] Patient Information 2015 [place], [place]. This information is not intended to replace advice given to you by your health care provider. Make sure you discuss any questions you have with your health care provider.

## 2014-03-31 NOTE — Assessment & Plan Note (Addendum)
Recurrent upper abdominal pain, associated with nausea. Actually stuttering symptoms over the past year. History of noncompliance PPI therapy. Suspect flare of gastritis/GERD. Cannot rule out peptic ulcer disease. Offered upper endoscopy for further evaluation. She would like to postpone and try medication first. Her support in 2 weeks. If no significant improvement, consider upper endoscopy. She is also due for colonoscopy at any time that she desires. Discussed antireflux measures, gastritis diet.

## 2014-04-01 NOTE — Progress Notes (Signed)
cc'ed to pcp °

## 2014-04-20 ENCOUNTER — Telehealth: Payer: Self-pay

## 2014-04-20 NOTE — Telephone Encounter (Signed)
PATIENT CALLED TO SCHEDULE COLONOSCOPY  PLEASE CALL

## 2014-04-20 NOTE — Telephone Encounter (Signed)
PATIENT INSURANCE DENIED HER PRESCRIPTION  PLEASE CALL [phone]   SHE MAY NEED SOMETHING ELSE CALLED IN

## 2014-04-22 NOTE — Telephone Encounter (Signed)
Tried to do a PA for dexilant. It was denied d/t pt has not tried and failed 1- prevacid 24 hr otc AND 2- pantoprazole.

## 2014-04-23 MED ORDER — PANTOPRAZOLE SODIUM 40 MG PO TBEC: 40.0000 mg | DELAYED_RELEASE_TABLET | Freq: Every day | ORAL | Status: DC

## 2014-04-23 NOTE — Telephone Encounter (Signed)
pantoprazole sent in.

## 2014-04-23 NOTE — Addendum Note (Signed)
Addended by: Mahala Menghini on: 04/23/2014 10:12 PM   Modules accepted: Orders, Medications

## 2014-04-27 NOTE — Telephone Encounter (Signed)
Called pt and lmom with information.

## 2014-05-04 ENCOUNTER — Encounter: Payer: Self-pay | Admitting: Gastroenterology

## 2014-05-04 ENCOUNTER — Telehealth: Payer: Self-pay | Admitting: *Deleted

## 2014-05-04 NOTE — Telephone Encounter (Signed)
Left message x 1. JSY 

## 2014-05-05 ENCOUNTER — Ambulatory Visit: Payer: No Typology Code available for payment source | Admitting: Obstetrics & Gynecology

## 2014-05-05 NOTE — Telephone Encounter (Signed)
Spoke with pt. Pt is having an uncomfortable feeling with urination. She has been taking cranberry pills. I advised she would need to be seen. Pt voiced understanding. Call transferred to front desk. [place]

## 2014-05-06 ENCOUNTER — Ambulatory Visit: Payer: No Typology Code available for payment source | Admitting: Obstetrics & Gynecology

## 2014-05-14 ENCOUNTER — Telehealth: Payer: Self-pay | Admitting: Adult Health

## 2014-05-14 ENCOUNTER — Encounter: Payer: Self-pay | Admitting: Adult Health

## 2014-05-14 ENCOUNTER — Ambulatory Visit (INDEPENDENT_AMBULATORY_CARE_PROVIDER_SITE_OTHER): Payer: No Typology Code available for payment source | Admitting: Adult Health

## 2014-05-14 VITALS — BP 138/78 | Ht 62.5 in | Wt 130.5 lb

## 2014-05-14 DIAGNOSIS — R319 Hematuria, unspecified: Secondary | ICD-10-CM | POA: Insufficient documentation

## 2014-05-14 DIAGNOSIS — R350 Frequency of micturition: Secondary | ICD-10-CM | POA: Insufficient documentation

## 2014-05-14 HISTORY — DX: Hematuria, unspecified: R31.9

## 2014-05-14 HISTORY — DX: Frequency of micturition: R35.0

## 2014-05-14 LAB — POCT URINALYSIS DIPSTICK
GLUCOSE UA: NEGATIVE
Nitrite, UA: NEGATIVE
Protein, UA: NEGATIVE

## 2014-05-14 MED ORDER — SULFAMETHOXAZOLE-TRIMETHOPRIM 800-160 MG PO TABS: 1.0000 | ORAL_TABLET | Freq: Two times a day (BID) | ORAL | Status: DC

## 2014-05-14 NOTE — Progress Notes (Signed)
Subjective:     Patient ID: Susan Baker, female   DOB: 28-Jul-1960, 53 y.o.   MRN: [id number]  HPI Susan Baker is a 53 year old white female in complaining of urinary frequency, is having sex again.No fever or nausea.  Review of Systems See HPI Reviewed past medical,surgical, social and family history. Reviewed medications and allergies.     Objective:   Physical Exam BP 138/78 mmHg  Ht 5' 2.5" (1.588 m)  Wt 130 lb 8 oz (59.194 kg)  BMI 23.47 kg/m2   urine 3+ leuks and 1+ blood, ?CVAT has fibromyalgia and back just hurts  Assessment:     Urinary frequency Hematuria      Plan:     Push fluids Rx septra ds 1 bid x 7 days #14 no refills UA C&S sent Pee before and after sex Review handout on UTI

## 2014-05-14 NOTE — Telephone Encounter (Signed)
Spoke with pt. Pt was wondering how many rx's she had at pharmacy. I looked in pt's chart and the only rx from today was Bactrim for the urinary symptoms. Pt voiced understanding. [place]

## 2014-05-14 NOTE — Patient Instructions (Signed)
Push fluids  Take septra ds Pee before and after sex Urinary Tract Infection Urinary tract infections (UTIs) can develop anywhere along your urinary tract. Your urinary tract is your body's drainage system for removing wastes and extra water. Your urinary tract includes two kidneys, two ureters, a bladder, and a urethra. Your kidneys are a pair of bean-shaped organs. Each kidney is about the size of your fist. They are located below your ribs, one on each side of your spine. CAUSES Infections are caused by microbes, which are microscopic organisms, including fungi, viruses, and bacteria. These organisms are so small that they can only be seen through a microscope. Bacteria are the microbes that most commonly cause UTIs. SYMPTOMS  Symptoms of UTIs may vary by age and gender of the patient and by the location of the infection. Symptoms in young women typically include a frequent and intense urge to urinate and a painful, burning feeling in the bladder or urethra during urination. Older women and men are more likely to be tired, shaky, and weak and have muscle aches and abdominal pain. A fever may mean the infection is in your kidneys. Other symptoms of a kidney infection include pain in your back or sides below the ribs, nausea, and vomiting. DIAGNOSIS To diagnose a UTI, your caregiver will ask you about your symptoms. Your caregiver also will ask to provide a urine sample. The urine sample will be tested for bacteria and white blood cells. White blood cells are made by your body to help fight infection. TREATMENT  Typically, UTIs can be treated with medication. Because most UTIs are caused by a bacterial infection, they usually can be treated with the use of antibiotics. The choice of antibiotic and length of treatment depend on your symptoms and the type of bacteria causing your infection. HOME CARE INSTRUCTIONS  If you were prescribed antibiotics, take them exactly as your caregiver instructs you.  Finish the medication even if you feel better after you have only taken some of the medication.  Drink enough water and fluids to keep your urine clear or pale yellow.  Avoid caffeine, tea, and carbonated beverages. They tend to irritate your bladder.  Empty your bladder often. Avoid holding urine for long periods of time.  Empty your bladder before and after sexual intercourse.  After a bowel movement, women should cleanse from front to back. Use each tissue only once. SEEK MEDICAL CARE IF:   You have back pain.  You develop a fever.  Your symptoms do not begin to resolve within 3 days. SEEK IMMEDIATE MEDICAL CARE IF:   You have severe back pain or lower abdominal pain.  You develop chills.  You have nausea or vomiting.  You have continued burning or discomfort with urination. MAKE SURE YOU:   Understand these instructions.  Will watch your condition.  Will get help right away if you are not doing well or get worse. Document Released: 03/29/2005 Document Revised: 12/19/2011 Document Reviewed: 07/28/2011 [facility] Patient Information 2015 [place], [place]. This information is not intended to replace advice given to you by your health care provider. Make sure you discuss any questions you have with your health care provider.

## 2014-05-15 LAB — URINALYSIS
Bilirubin Urine: NEGATIVE
Glucose, UA: NEGATIVE mg/dL
Hgb urine dipstick: NEGATIVE
Ketones, ur: NEGATIVE mg/dL
Nitrite: NEGATIVE
Protein, ur: NEGATIVE mg/dL
SPECIFIC GRAVITY, URINE: 1.024 (ref 1.005–1.030)
UROBILINOGEN UA: 0.2 mg/dL (ref 0.0–1.0)
pH: 6.5 (ref 5.0–8.0)

## 2014-05-15 NOTE — Telephone Encounter (Signed)
I called pt yesterday and she was an another appt. Said she will call back.

## 2014-05-17 LAB — URINE CULTURE

## 2014-06-10 NOTE — Telephone Encounter (Signed)
Letter mailed to pt.  

## 2014-07-22 ENCOUNTER — Other Ambulatory Visit (HOSPITAL_COMMUNITY): Payer: Self-pay | Admitting: Family Medicine

## 2014-07-22 DIAGNOSIS — Z1231 Encounter for screening mammogram for malignant neoplasm of breast: Secondary | ICD-10-CM

## 2014-07-27 ENCOUNTER — Ambulatory Visit (HOSPITAL_COMMUNITY)
Admission: RE | Admit: 2014-07-27 | Discharge: 2014-07-27 | Disposition: A | Discharge status: Home / Self Care | Payer: No Typology Code available for payment source | Source: Ambulatory Visit | Attending: Family Medicine | Admitting: Family Medicine

## 2014-07-27 DIAGNOSIS — Z1231 Encounter for screening mammogram for malignant neoplasm of breast: Secondary | ICD-10-CM | POA: Insufficient documentation

## 2014-08-13 ENCOUNTER — Telehealth: Payer: Self-pay | Admitting: Internal Medicine

## 2014-08-13 NOTE — Telephone Encounter (Signed)
Pt called today to set up procedure. She said that she had received a letter back in December from [place] and is ready to schedule now. Please call [phone]

## 2014-08-19 NOTE — Telephone Encounter (Signed)
LMOM for a return call.  

## 2014-09-09 DIAGNOSIS — M546 Pain in thoracic spine: Secondary | ICD-10-CM | POA: Diagnosis not present

## 2014-09-09 DIAGNOSIS — M5414 Radiculopathy, thoracic region: Secondary | ICD-10-CM | POA: Diagnosis not present

## 2014-09-09 DIAGNOSIS — M461 Sacroiliitis, not elsewhere classified: Secondary | ICD-10-CM | POA: Diagnosis not present

## 2014-09-09 DIAGNOSIS — M5431 Sciatica, right side: Secondary | ICD-10-CM | POA: Diagnosis not present

## 2014-09-09 DIAGNOSIS — M542 Cervicalgia: Secondary | ICD-10-CM | POA: Diagnosis not present

## 2014-09-09 DIAGNOSIS — M545 Low back pain: Secondary | ICD-10-CM | POA: Diagnosis not present

## 2014-09-09 DIAGNOSIS — M797 Fibromyalgia: Secondary | ICD-10-CM | POA: Diagnosis not present

## 2014-09-09 DIAGNOSIS — M5432 Sciatica, left side: Secondary | ICD-10-CM | POA: Diagnosis not present

## 2014-09-23 NOTE — Telephone Encounter (Signed)
LMOM to call and schedule colonoscopy. Letter mailed to pt also.

## 2014-10-06 DIAGNOSIS — M5412 Radiculopathy, cervical region: Secondary | ICD-10-CM | POA: Diagnosis not present

## 2014-10-15 DIAGNOSIS — Z029 Encounter for administrative examinations, unspecified: Secondary | ICD-10-CM

## 2014-10-21 DIAGNOSIS — M5415 Radiculopathy, thoracolumbar region: Secondary | ICD-10-CM | POA: Diagnosis not present

## 2014-11-04 DIAGNOSIS — M546 Pain in thoracic spine: Secondary | ICD-10-CM | POA: Diagnosis not present

## 2014-11-04 DIAGNOSIS — M542 Cervicalgia: Secondary | ICD-10-CM | POA: Diagnosis not present

## 2014-11-04 DIAGNOSIS — M545 Low back pain: Secondary | ICD-10-CM | POA: Diagnosis not present

## 2014-11-04 DIAGNOSIS — M5412 Radiculopathy, cervical region: Secondary | ICD-10-CM | POA: Diagnosis not present

## 2014-11-04 DIAGNOSIS — M5431 Sciatica, right side: Secondary | ICD-10-CM | POA: Diagnosis not present

## 2014-11-04 DIAGNOSIS — M5414 Radiculopathy, thoracic region: Secondary | ICD-10-CM | POA: Diagnosis not present

## 2014-11-04 DIAGNOSIS — M5432 Sciatica, left side: Secondary | ICD-10-CM | POA: Diagnosis not present

## 2014-11-04 DIAGNOSIS — M797 Fibromyalgia: Secondary | ICD-10-CM | POA: Diagnosis not present

## 2014-12-30 DIAGNOSIS — M545 Low back pain: Secondary | ICD-10-CM | POA: Diagnosis not present

## 2014-12-30 DIAGNOSIS — M546 Pain in thoracic spine: Secondary | ICD-10-CM | POA: Diagnosis not present

## 2014-12-30 DIAGNOSIS — M5431 Sciatica, right side: Secondary | ICD-10-CM | POA: Diagnosis not present

## 2014-12-30 DIAGNOSIS — M5414 Radiculopathy, thoracic region: Secondary | ICD-10-CM | POA: Diagnosis not present

## 2014-12-30 DIAGNOSIS — M542 Cervicalgia: Secondary | ICD-10-CM | POA: Diagnosis not present

## 2014-12-30 DIAGNOSIS — M5432 Sciatica, left side: Secondary | ICD-10-CM | POA: Diagnosis not present

## 2014-12-30 DIAGNOSIS — M5412 Radiculopathy, cervical region: Secondary | ICD-10-CM | POA: Diagnosis not present

## 2014-12-30 DIAGNOSIS — M797 Fibromyalgia: Secondary | ICD-10-CM | POA: Diagnosis not present

## 2015-01-13 DIAGNOSIS — M5414 Radiculopathy, thoracic region: Secondary | ICD-10-CM | POA: Diagnosis not present

## 2015-01-29 DIAGNOSIS — Z0000 Encounter for general adult medical examination without abnormal findings: Secondary | ICD-10-CM | POA: Diagnosis not present

## 2015-02-25 DIAGNOSIS — M5414 Radiculopathy, thoracic region: Secondary | ICD-10-CM | POA: Diagnosis not present

## 2015-02-25 DIAGNOSIS — M545 Low back pain: Secondary | ICD-10-CM | POA: Diagnosis not present

## 2015-02-25 DIAGNOSIS — M5412 Radiculopathy, cervical region: Secondary | ICD-10-CM | POA: Diagnosis not present

## 2015-02-25 DIAGNOSIS — M5432 Sciatica, left side: Secondary | ICD-10-CM | POA: Diagnosis not present

## 2015-02-25 DIAGNOSIS — M797 Fibromyalgia: Secondary | ICD-10-CM | POA: Diagnosis not present

## 2015-02-25 DIAGNOSIS — M546 Pain in thoracic spine: Secondary | ICD-10-CM | POA: Diagnosis not present

## 2015-02-25 DIAGNOSIS — M542 Cervicalgia: Secondary | ICD-10-CM | POA: Diagnosis not present

## 2015-02-25 DIAGNOSIS — M5431 Sciatica, right side: Secondary | ICD-10-CM | POA: Diagnosis not present

## 2015-03-01 ENCOUNTER — Other Ambulatory Visit (HOSPITAL_COMMUNITY)
Admission: RE | Admit: 2015-03-01 | Discharge: 2015-03-01 | Disposition: A | Discharge status: Home / Self Care | Payer: Medicare Other | Source: Ambulatory Visit | Attending: Adult Health | Admitting: Adult Health

## 2015-03-01 ENCOUNTER — Ambulatory Visit (INDEPENDENT_AMBULATORY_CARE_PROVIDER_SITE_OTHER): Payer: Medicare Other | Admitting: Adult Health

## 2015-03-01 ENCOUNTER — Encounter: Payer: Self-pay | Admitting: Adult Health

## 2015-03-01 VITALS — BP 112/62 | HR 92 | Ht 62.5 in | Wt 138.0 lb

## 2015-03-01 DIAGNOSIS — Z90710 Acquired absence of both cervix and uterus: Secondary | ICD-10-CM | POA: Diagnosis not present

## 2015-03-01 DIAGNOSIS — Z01419 Encounter for gynecological examination (general) (routine) without abnormal findings: Secondary | ICD-10-CM

## 2015-03-01 DIAGNOSIS — Z86001 Personal history of in-situ neoplasm of cervix uteri: Secondary | ICD-10-CM | POA: Diagnosis not present

## 2015-03-01 DIAGNOSIS — Z1272 Encounter for screening for malignant neoplasm of vagina: Secondary | ICD-10-CM | POA: Diagnosis not present

## 2015-03-01 DIAGNOSIS — Z08 Encounter for follow-up examination after completed treatment for malignant neoplasm: Secondary | ICD-10-CM

## 2015-03-01 DIAGNOSIS — R87629 Unspecified abnormal cytological findings in specimens from vagina: Secondary | ICD-10-CM

## 2015-03-01 DIAGNOSIS — Z1151 Encounter for screening for human papillomavirus (HPV): Secondary | ICD-10-CM | POA: Insufficient documentation

## 2015-03-01 DIAGNOSIS — Z1212 Encounter for screening for malignant neoplasm of rectum: Secondary | ICD-10-CM | POA: Diagnosis not present

## 2015-03-01 DIAGNOSIS — D069 Carcinoma in situ of cervix, unspecified: Secondary | ICD-10-CM

## 2015-03-01 DIAGNOSIS — Z780 Asymptomatic menopausal state: Secondary | ICD-10-CM

## 2015-03-01 LAB — HEMOCCULT GUIAC POC 1CARD (OFFICE): FECAL OCCULT BLD: NEGATIVE

## 2015-03-01 NOTE — Patient Instructions (Signed)
Pap and physical in 1 year Mammogram yearly Labs with PCP Colonoscopy advised

## 2015-03-01 NOTE — Progress Notes (Signed)
Patient ID: Susan Baker, female   DOB: 1960-09-06, 54 y.o.   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 PCP is Dr Everette Rank.  Current Medications, Allergies, Past Medical History, Past Surgical History, Family History and Social History were reviewed in [employer] record.     Review of Systems: Patient denies any headaches, hearing loss, fatigue, blurred vision, shortness of breath, chest pain,problems with bowel movements, urination, or intercourse. No joint pain or mood swings.Has body aches from fibromyalgia but doing "OK", has boyfriend and having sex again.She is still not smoking and sees pain clinic for back and fibr, and she got disability.She says stomach hurts at times, no bowel changes, has not gotten colonoscopy yet.    Physical Exam:BP 112/62 mmHg  Pulse 92  Ht 5' 2.5" (1.588 m)  Wt 138 lb (62.596 kg)  BMI 24.82 kg/m2 General:  Well developed, well nourished, no acute distress Skin:  Warm and dry Neck:  Midline trachea, normal thyroid, good ROM, no lymphadenopathy Lungs; Clear to auscultation bilaterally Breast:  No dominant palpable mass, retraction, or nipple discharge Cardiovascular: Regular rate and rhythm Abdomen:  Soft, non tender, no hepatosplenomegaly Pelvic:  External genitalia is normal in appearance, no lesions.Has skin tag right labia  The vagina has decreased color, moisture and rugae. Urethra has no lesions or masses. The cervix and uterus are absent.Pap performed on vagina.  No adnexal masses or tenderness noted.Bladder is non tender, no masses felt. Rectal: Good sphincter tone, no polyps, or hemorrhoids felt.  Hemoccult negative. Extremities/musculoskeletal:  No swelling or varicosities noted, no clubbing or cyanosis Psych:  No mood changes, alert and cooperative,seems  happy   Impression: Well woman gyn exam with pap PM History of abnormal vaginal pap History of cervical cancer  Plan: Pap and physical in 1 year Mammogram yearly Labs with PCP Get colonoscopy

## 2015-03-04 LAB — CYTOLOGY - PAP

## 2015-03-23 ENCOUNTER — Telehealth: Payer: Self-pay | Admitting: Adult Health

## 2015-03-23 DIAGNOSIS — M5412 Radiculopathy, cervical region: Secondary | ICD-10-CM | POA: Diagnosis not present

## 2015-03-23 NOTE — Telephone Encounter (Signed)
Spoke with pt letting her know her Pap and HPV was negative. Pt stated that she has had an abnormal pap in the past. I advised this one looked good. Pt voiced understanding. [place]

## 2015-04-15 DIAGNOSIS — M797 Fibromyalgia: Secondary | ICD-10-CM | POA: Diagnosis not present

## 2015-04-15 DIAGNOSIS — M545 Low back pain: Secondary | ICD-10-CM | POA: Diagnosis not present

## 2015-04-15 DIAGNOSIS — M5414 Radiculopathy, thoracic region: Secondary | ICD-10-CM | POA: Diagnosis not present

## 2015-04-15 DIAGNOSIS — M546 Pain in thoracic spine: Secondary | ICD-10-CM | POA: Diagnosis not present

## 2015-04-15 DIAGNOSIS — M5432 Sciatica, left side: Secondary | ICD-10-CM | POA: Diagnosis not present

## 2015-04-15 DIAGNOSIS — M542 Cervicalgia: Secondary | ICD-10-CM | POA: Diagnosis not present

## 2015-04-15 DIAGNOSIS — M5412 Radiculopathy, cervical region: Secondary | ICD-10-CM | POA: Diagnosis not present

## 2015-04-15 DIAGNOSIS — M5431 Sciatica, right side: Secondary | ICD-10-CM | POA: Diagnosis not present

## 2015-04-16 DIAGNOSIS — J019 Acute sinusitis, unspecified: Secondary | ICD-10-CM | POA: Diagnosis not present

## 2015-05-05 DIAGNOSIS — M5414 Radiculopathy, thoracic region: Secondary | ICD-10-CM | POA: Diagnosis not present

## 2015-05-09 ENCOUNTER — Other Ambulatory Visit: Payer: Self-pay | Admitting: Gastroenterology

## 2015-06-10 DIAGNOSIS — M5431 Sciatica, right side: Secondary | ICD-10-CM | POA: Diagnosis not present

## 2015-06-10 DIAGNOSIS — M797 Fibromyalgia: Secondary | ICD-10-CM | POA: Diagnosis not present

## 2015-06-10 DIAGNOSIS — M542 Cervicalgia: Secondary | ICD-10-CM | POA: Diagnosis not present

## 2015-06-10 DIAGNOSIS — M545 Low back pain: Secondary | ICD-10-CM | POA: Diagnosis not present

## 2015-06-10 DIAGNOSIS — M5412 Radiculopathy, cervical region: Secondary | ICD-10-CM | POA: Diagnosis not present

## 2015-06-10 DIAGNOSIS — M546 Pain in thoracic spine: Secondary | ICD-10-CM | POA: Diagnosis not present

## 2015-06-10 DIAGNOSIS — M5432 Sciatica, left side: Secondary | ICD-10-CM | POA: Diagnosis not present

## 2015-06-10 DIAGNOSIS — M5414 Radiculopathy, thoracic region: Secondary | ICD-10-CM | POA: Diagnosis not present

## 2015-08-05 DIAGNOSIS — M797 Fibromyalgia: Secondary | ICD-10-CM | POA: Diagnosis not present

## 2015-08-05 DIAGNOSIS — Z79899 Other long term (current) drug therapy: Secondary | ICD-10-CM | POA: Diagnosis not present

## 2015-08-05 DIAGNOSIS — M461 Sacroiliitis, not elsewhere classified: Secondary | ICD-10-CM | POA: Diagnosis not present

## 2015-08-05 DIAGNOSIS — M546 Pain in thoracic spine: Secondary | ICD-10-CM | POA: Diagnosis not present

## 2015-08-05 DIAGNOSIS — M545 Low back pain: Secondary | ICD-10-CM | POA: Diagnosis not present

## 2015-08-05 DIAGNOSIS — M5414 Radiculopathy, thoracic region: Secondary | ICD-10-CM | POA: Diagnosis not present

## 2015-08-05 DIAGNOSIS — M5412 Radiculopathy, cervical region: Secondary | ICD-10-CM | POA: Diagnosis not present

## 2015-08-05 DIAGNOSIS — M5432 Sciatica, left side: Secondary | ICD-10-CM | POA: Diagnosis not present

## 2015-08-05 DIAGNOSIS — M5431 Sciatica, right side: Secondary | ICD-10-CM | POA: Diagnosis not present

## 2015-08-05 DIAGNOSIS — Z5181 Encounter for therapeutic drug level monitoring: Secondary | ICD-10-CM | POA: Diagnosis not present

## 2015-08-18 ENCOUNTER — Emergency Department (HOSPITAL_COMMUNITY): Payer: Medicare Other

## 2015-08-18 ENCOUNTER — Encounter (HOSPITAL_COMMUNITY): Payer: Self-pay | Admitting: Cardiology

## 2015-08-18 ENCOUNTER — Emergency Department (HOSPITAL_COMMUNITY)
Admission: EM | Admit: 2015-08-18 | Discharge: 2015-08-18 | Disposition: A | Discharge status: Home / Self Care | Payer: Medicare Other | Attending: Emergency Medicine | Admitting: Emergency Medicine

## 2015-08-18 DIAGNOSIS — R072 Precordial pain: Secondary | ICD-10-CM | POA: Diagnosis not present

## 2015-08-18 DIAGNOSIS — N912 Amenorrhea, unspecified: Secondary | ICD-10-CM | POA: Diagnosis not present

## 2015-08-18 DIAGNOSIS — Z8742 Personal history of other diseases of the female genital tract: Secondary | ICD-10-CM | POA: Insufficient documentation

## 2015-08-18 DIAGNOSIS — R0789 Other chest pain: Secondary | ICD-10-CM | POA: Diagnosis not present

## 2015-08-18 DIAGNOSIS — Z8711 Personal history of peptic ulcer disease: Secondary | ICD-10-CM | POA: Diagnosis not present

## 2015-08-18 DIAGNOSIS — M1990 Unspecified osteoarthritis, unspecified site: Secondary | ICD-10-CM | POA: Diagnosis not present

## 2015-08-18 DIAGNOSIS — M797 Fibromyalgia: Secondary | ICD-10-CM | POA: Diagnosis not present

## 2015-08-18 DIAGNOSIS — F329 Major depressive disorder, single episode, unspecified: Secondary | ICD-10-CM | POA: Insufficient documentation

## 2015-08-18 DIAGNOSIS — F17210 Nicotine dependence, cigarettes, uncomplicated: Secondary | ICD-10-CM | POA: Insufficient documentation

## 2015-08-18 DIAGNOSIS — Z79899 Other long term (current) drug therapy: Secondary | ICD-10-CM | POA: Insufficient documentation

## 2015-08-18 DIAGNOSIS — F17200 Nicotine dependence, unspecified, uncomplicated: Secondary | ICD-10-CM | POA: Diagnosis not present

## 2015-08-18 DIAGNOSIS — Z8542 Personal history of malignant neoplasm of other parts of uterus: Secondary | ICD-10-CM | POA: Diagnosis not present

## 2015-08-18 DIAGNOSIS — K219 Gastro-esophageal reflux disease without esophagitis: Secondary | ICD-10-CM | POA: Diagnosis not present

## 2015-08-18 DIAGNOSIS — R079 Chest pain, unspecified: Secondary | ICD-10-CM

## 2015-08-18 DIAGNOSIS — F419 Anxiety disorder, unspecified: Secondary | ICD-10-CM | POA: Diagnosis not present

## 2015-08-18 DIAGNOSIS — R000 Tachycardia, unspecified: Secondary | ICD-10-CM | POA: Diagnosis not present

## 2015-08-18 LAB — BASIC METABOLIC PANEL
ANION GAP: 9 (ref 5–15)
BUN: 12 mg/dL (ref 6–20)
CO2: 28 mmol/L (ref 22–32)
Calcium: 9.4 mg/dL (ref 8.9–10.3)
Chloride: 104 mmol/L (ref 101–111)
Creatinine, Ser: 0.74 mg/dL (ref 0.44–1.00)
GLUCOSE: 87 mg/dL (ref 65–99)
POTASSIUM: 4 mmol/L (ref 3.5–5.1)
Sodium: 141 mmol/L (ref 135–145)

## 2015-08-18 LAB — CBC
HEMATOCRIT: 37.6 % (ref 36.0–46.0)
Hemoglobin: 13 g/dL (ref 12.0–15.0)
MCH: 31.6 pg (ref 26.0–34.0)
MCHC: 34.6 g/dL (ref 30.0–36.0)
MCV: 91.5 fL (ref 78.0–100.0)
Platelets: 198 10*3/uL (ref 150–400)
RBC: 4.11 MIL/uL (ref 3.87–5.11)
RDW: 12.7 % (ref 11.5–15.5)
WBC: 5.4 10*3/uL (ref 4.0–10.5)

## 2015-08-18 LAB — TROPONIN I: Troponin I: <0.03 ng/mL (ref <0.031)

## 2015-08-18 NOTE — ED Provider Notes (Signed)
CSN: [id number]     Arrival date & time 08/18/15  1347 History   First MD Initiated Contact with Patient 08/18/15 1504     Chief Complaint  Patient presents with  . Chest Pain     (Consider location/radiation/quality/duration/timing/severity/associated sxs/prior Treatment) Patient is a 55 y.o. female presenting with chest pain. The history is provided by the patient (The patient states that she's been having periodic chest discomfort for a month. Is not related to exertion.).  Chest Pain Pain location:  Substernal area Pain quality: aching   Pain radiates to:  Does not radiate Pain radiates to the back: no   Pain severity:  Moderate Onset quality:  Sudden Timing:  Intermittent Chronicity:  New Associated symptoms: no abdominal pain, no back pain, no cough, no fatigue and no headache     Past Medical History  Diagnosis Date  . Fibromyalgia   . Uterine cancer ([place])   . PUD (peptic ulcer disease)     bleeding PUD per patient, remote, On Daypro  . GERD (gastroesophageal reflux disease)     egd with RE per patient, remote  . Bulging disc     cervical, [facility]  . Osteoarthritis (arthritis due to wear and tear of joints)   . Depression 02/18/2013  . Abnormal Pap smear   . Vaginal Pap smear, abnormal   . History of abnormal cervical Pap smear 02/24/2014  . Postmenopausal 02/24/2014  . Osteoporosis, unspecified 03/03/2014  . Anxiety   . Urinary frequency 05/14/2014  . Hematuria 05/14/2014  . Osteoporosis    Past Surgical History  Procedure Laterality Date  . Hysterectomy for uterine cancer      partial  . Ectopic pregnancy surgery      twice  . Esophagogastroduodenoscopy  12/01/2010    mild distal ERE, antral erosions due to NSAIDS, no H.Pylori   Family History  Problem Relation Age of Onset  . Cirrhosis Father     etoh, died with liver cancer  . Diabetes Father   . Cirrhosis Paternal Uncle     multiple, etoh  . Colon cancer Neg Hx   . Pancreatic cancer Paternal  Grandmother   . Diabetes Paternal Grandmother   . Kidney failure Mother   . Diabetes Mother   . Other Brother     crohn's disease  . Hyperlipidemia Brother   . Fibromyalgia Daughter   . Diabetes Maternal Grandmother    Social History  Substance Use Topics  . Smoking status: Current Every Day Smoker -- 0.50 packs/day for 30 years    Types: Cigarettes  . Smokeless tobacco: Never Used  . Alcohol Use: No   OB History    Gravida Para Term Preterm AB TAB SAB Ectopic Multiple Living   4 2   2   2  2      Review of Systems  Constitutional: Negative for appetite change and fatigue.  HENT: Negative for congestion, ear discharge and sinus pressure.   Eyes: Negative for discharge.  Respiratory: Negative for cough.   Cardiovascular: Positive for chest pain.  Gastrointestinal: Negative for abdominal pain and diarrhea.  Genitourinary: Negative for frequency and hematuria.  Musculoskeletal: Negative for back pain.  Skin: Negative for rash.  Neurological: Negative for seizures and headaches.  Psychiatric/Behavioral: Negative for hallucinations.      Allergies  Cymbalta; Daypro; Trazodone and nefazodone; and Nucynta  Home Medications   Prior to Admission medications   Medication Sig Start Date End Date Taking? Authorizing Provider  FLUoxetine (PROZAC) 40 MG  capsule Take 40 mg by mouth 2 (two) times daily.    Yes Historical Provider, MD  ibuprofen (ADVIL,MOTRIN) 200 MG tablet Take 200 mg by mouth every 6 (six) hours as needed. Takes about twice per week.   Yes Historical Provider, MD  Oxycodone HCl 10 MG TABS Take 10 mg by mouth 4 (four) times daily. 02/09/15  Yes Historical Provider, MD  pantoprazole (PROTONIX) 40 MG tablet TAKE 1 TABLET (40 MG TOTAL) BY MOUTH DAILY. 05/10/15  Yes Orvil Feil, NP  pregabalin (LYRICA) 75 MG capsule Take 75 mg by mouth 3 (three) times daily.   Yes Historical Provider, MD  QUEtiapine (SEROQUEL) 25 MG tablet 25 mg as needed.  05/07/14  Yes Historical Provider,  MD  zolpidem (AMBIEN CR) 12.5 MG CR tablet 12.5 mg at bedtime as needed.  05/11/14  Yes Historical Provider, MD   BP 112/77 mmHg  Pulse 67  Temp(Src) 98.1 F (36.7 C) (Oral)  Resp 27  Ht 5\' 3"  (1.6 m)  Wt 140 lb (63.504 kg)  BMI 24.81 kg/m2  SpO2 97% Physical Exam  Constitutional: She is oriented to person, place, and time. She appears well-developed.  HENT:  Head: Normocephalic.  Eyes: Conjunctivae and EOM are normal. No scleral icterus.  Neck: Neck supple. No thyromegaly present.  Cardiovascular: Normal rate and regular rhythm.  Exam reveals no gallop and no friction rub.   No murmur heard. Pulmonary/Chest: No stridor. She has no wheezes. She has no rales. She exhibits no tenderness.  Abdominal: She exhibits no distension. There is no tenderness. There is no rebound.  Musculoskeletal: Normal range of motion. She exhibits no edema.  Lymphadenopathy:    She has no cervical adenopathy.  Neurological: She is oriented to person, place, and time. She exhibits normal muscle tone. Coordination normal.  Skin: No rash noted. No erythema.  Psychiatric: She has a normal mood and affect. Her behavior is normal.    ED Course  Procedures (including critical care time) Labs Review Labs Reviewed  BASIC METABOLIC PANEL  CBC  TROPONIN I  TROPONIN I    Imaging Review Dg Chest 2 View  08/18/2015  CLINICAL DATA:  Chest pain. History of uterine carcinoma. History of smoking. EXAM: CHEST  2 VIEW COMPARISON:  07/15/2008 FINDINGS: The heart size and mediastinal contours are within normal limits. Both lungs are clear. No pleural effusion or pneumothorax. The visualized skeletal structures are unremarkable. IMPRESSION: No active cardiopulmonary disease. Electronically Signed   By: Lajean Manes M.D.   On: 08/18/2015 14:51   I have personally reviewed and evaluated these images and lab results as part of my medical decision-making.   EKG Interpretation   Date/Time:  Wednesday August 18 2015  13:56:09 EST Ventricular Rate:  96 PR Interval:  129 QRS Duration: 81 QT Interval:  351 QTC Calculation: 443 R Axis:   25 Text Interpretation:  Sinus rhythm Abnormal R-wave progression, early  transition Confirmed by Lajada Janes  MD, Nickayla Mcinnis [phone]) on 08/18/2015 3:11:01 PM      MDM   Final diagnoses:  Chest pain at rest    EKG unremarkable labs normal including 2 negative troponins chest x-ray negative. Patient not in chest pain now. She will be discharged to follow-up with cardiology and possibly get a stress test    Milton Ferguson, MD 08/18/15 1815

## 2015-08-18 NOTE — Discharge Instructions (Signed)
Follow up with dr. Laban Emperor or another cardiologist here

## 2015-08-18 NOTE — ED Notes (Signed)
Chest pain times one month.  Went to PCP today.  EMS gave 1 sl ntg with no relief.  Gave 4 baby aspirin . No changes in EKG from prior EKG per PCP.

## 2015-08-30 DIAGNOSIS — M5412 Radiculopathy, cervical region: Secondary | ICD-10-CM | POA: Diagnosis not present

## 2015-09-08 ENCOUNTER — Other Ambulatory Visit: Payer: Self-pay | Admitting: Adult Health

## 2015-09-08 DIAGNOSIS — Z1231 Encounter for screening mammogram for malignant neoplasm of breast: Secondary | ICD-10-CM

## 2015-09-15 ENCOUNTER — Ambulatory Visit (HOSPITAL_COMMUNITY)
Admission: RE | Admit: 2015-09-15 | Discharge: 2015-09-15 | Disposition: A | Discharge status: Home / Self Care | Payer: Medicare Other | Source: Ambulatory Visit | Attending: Adult Health | Admitting: Adult Health

## 2015-09-15 ENCOUNTER — Ambulatory Visit (INDEPENDENT_AMBULATORY_CARE_PROVIDER_SITE_OTHER): Payer: Medicare Other | Admitting: Adult Health

## 2015-09-15 ENCOUNTER — Encounter: Payer: Self-pay | Admitting: Adult Health

## 2015-09-15 VITALS — BP 108/80 | HR 80 | Ht 63.0 in | Wt 144.5 lb

## 2015-09-15 DIAGNOSIS — Z1231 Encounter for screening mammogram for malignant neoplasm of breast: Secondary | ICD-10-CM | POA: Diagnosis not present

## 2015-09-15 DIAGNOSIS — N930 Postcoital and contact bleeding: Secondary | ICD-10-CM | POA: Diagnosis not present

## 2015-09-15 HISTORY — DX: Postcoital and contact bleeding: N93.0

## 2015-09-15 NOTE — Patient Instructions (Signed)
Try luvena Follow up in august for pap and physical

## 2015-09-15 NOTE — Progress Notes (Signed)
Subjective:     Patient ID: Susan Baker, female   DOB: Mar 15, 1961, 55 y.o.   MRN: [id number]  HPI Susan Baker is a 55 year old white female in complaining of bleeding after sex and occasional discomfort.   Review of Systems Patient denies any headaches, hearing loss, fatigue, blurred vision, shortness of breath, chest pain, abdominal pain, problems with bowel movements, urination.  No joint pain or mood swings.See HPI for positives. Reviewed past medical,surgical, social and family history. Reviewed medications and allergies.     Objective:   Physical Exam BP 108/80 mmHg  Pulse 80  Ht 5\' 3"  (1.6 m)  Wt 144 lb 8 oz (65.545 kg)  BMI 25.60 kg/m2   Skin warm and dry.Pelvic: external genitalia is normal in appearance no lesions, vagina: has decreased color, moisture and rugae,urethra has no lesions or masses noted, cervix and uterus are absent, adnexa: no masses or tenderness noted. Bladder is non tender and no masses felt. Will try luvena and different positions.  Assessment:     Postcoital bleeding     Plan:     Try luvena Follow up in august for pap and physical

## 2015-09-28 ENCOUNTER — Encounter: Payer: Self-pay | Admitting: Gastroenterology

## 2015-09-28 ENCOUNTER — Telehealth: Payer: Self-pay | Admitting: Gastroenterology

## 2015-09-28 ENCOUNTER — Ambulatory Visit: Payer: Medicare Other | Admitting: Gastroenterology

## 2015-09-28 NOTE — Telephone Encounter (Signed)
PATIENT WAS A NO SHOW AND LETTER SENT  °

## 2015-10-25 DIAGNOSIS — Z5181 Encounter for therapeutic drug level monitoring: Secondary | ICD-10-CM | POA: Diagnosis not present

## 2015-10-25 DIAGNOSIS — M461 Sacroiliitis, not elsewhere classified: Secondary | ICD-10-CM | POA: Diagnosis not present

## 2015-10-25 DIAGNOSIS — Z79899 Other long term (current) drug therapy: Secondary | ICD-10-CM | POA: Diagnosis not present

## 2015-10-25 DIAGNOSIS — M545 Low back pain: Secondary | ICD-10-CM | POA: Diagnosis not present

## 2015-10-25 DIAGNOSIS — M5414 Radiculopathy, thoracic region: Secondary | ICD-10-CM | POA: Diagnosis not present

## 2015-10-25 DIAGNOSIS — M5412 Radiculopathy, cervical region: Secondary | ICD-10-CM | POA: Diagnosis not present

## 2015-10-25 DIAGNOSIS — M546 Pain in thoracic spine: Secondary | ICD-10-CM | POA: Diagnosis not present

## 2015-10-25 DIAGNOSIS — M5431 Sciatica, right side: Secondary | ICD-10-CM | POA: Diagnosis not present

## 2015-10-25 DIAGNOSIS — M5432 Sciatica, left side: Secondary | ICD-10-CM | POA: Diagnosis not present

## 2015-10-25 DIAGNOSIS — M542 Cervicalgia: Secondary | ICD-10-CM | POA: Diagnosis not present

## 2015-11-01 ENCOUNTER — Telehealth: Payer: Self-pay | Admitting: Internal Medicine

## 2015-11-01 NOTE — Telephone Encounter (Signed)
Agree 

## 2015-11-01 NOTE — Telephone Encounter (Signed)
I spoke with the pt, she said it all started around midnight Friday night. She woke up with abd pain and vomiting and a little diarrhea. No more vomiting, but she continues to have diarrhea at least 2-3 times a day and some central abd pain and rectal bleeding. No fever, no recent abx. She has noticed bright red blood and dark blood in the stool, in the water and with wiping. Pt is feeling better today, she has been taking some ibuprofen and hasnt been on her ppi lately, she took pepto on Friday pt is concerned about her ulcer she had previously. Pt admitted she didn't set up procedures as recommended. She said she had a lot going on at that time. Pt has not been seen in the office since 2015. I have made the pt an appt to see AS tomorrow at 1:30 and she is aware of that appt. Pt is also aware to go to the ED if she worsens overnight.

## 2015-11-01 NOTE — Telephone Encounter (Signed)
Pt called this morning to let us know that she has had diarrhea and vomiting (with some blood) over the weekend. She doesn't know if it's a virus or if something else is going on and didn't know if she should make an OV or not. I told her that I was going to let her leave a VM for RMR nurse and she what she would advise.

## 2015-11-02 ENCOUNTER — Other Ambulatory Visit: Payer: Self-pay | Admitting: Gastroenterology

## 2015-11-02 ENCOUNTER — Other Ambulatory Visit: Payer: Self-pay

## 2015-11-02 ENCOUNTER — Ambulatory Visit (INDEPENDENT_AMBULATORY_CARE_PROVIDER_SITE_OTHER): Payer: Medicare Other | Admitting: Gastroenterology

## 2015-11-02 ENCOUNTER — Encounter: Payer: Self-pay | Admitting: Gastroenterology

## 2015-11-02 VITALS — BP 116/78 | HR 74 | Temp 98.4°F | Ht 63.0 in | Wt 137.4 lb

## 2015-11-02 DIAGNOSIS — R1310 Dysphagia, unspecified: Secondary | ICD-10-CM | POA: Diagnosis not present

## 2015-11-02 DIAGNOSIS — R591 Generalized enlarged lymph nodes: Secondary | ICD-10-CM | POA: Diagnosis not present

## 2015-11-02 DIAGNOSIS — R1011 Right upper quadrant pain: Secondary | ICD-10-CM

## 2015-11-02 DIAGNOSIS — K625 Hemorrhage of anus and rectum: Secondary | ICD-10-CM

## 2015-11-02 MED ORDER — PEG 3350-KCL-NA BICARB-NACL 420 G PO SOLR: 4000.0000 mL | Freq: Once | ORAL | Status: DC

## 2015-11-02 MED ORDER — DEXLANSOPRAZOLE 60 MG PO CPDR: 60.0000 mg | DELAYED_RELEASE_CAPSULE | Freq: Every day | ORAL | Status: DC

## 2015-11-02 NOTE — Patient Instructions (Signed)
I sent Dexilant to your pharmacy to see if this is covered well. If not, continue Protonix once daily.  Please have blood work done today. We have scheduled you for a neck ultrasound as well.   We have scheduled you for a colonoscopy, upper endoscopy, and dilation if needed, with Dr. Gala Romney in the near future.

## 2015-11-02 NOTE — Progress Notes (Signed)
Referring Provider: Marjean Donna, MD Primary Care Physician:  Jani Gravel, MD  Primary GI: Dr. Gala Romney   Chief Complaint  Patient presents with  . Diarrhea  . Abdominal Pain    HPI:   Susan Baker is a 55 y.o. female presenting today with a history of abdominal pain, vomiting, and diarrhea. Last seen Sept 2015.  Extensive evaluation in 2012 with ultrasound, HIDA, MRI. She did note mild abdominal pain with CCK infusion during HIDA but EF was 82%, which is normal. EGD on file with mild distal erosive reflux esophagitis, antral erosions, negative H.pylori (May 2012). No prior colonoscopy.   Has been out of Protonix for a week as she was unable to pick it up. Woke up around midnight Friday night with stomach aches, nausea, vomiting. Wasn't sure if vomiting was because of abdominal pain or not. Rectal bleeding started the next morning but felt like she was having mostly just blood, not stool. Happened about 3-4 times, then had more diarrhea. Diarrhea improved and now more soft. Abdominal pain somewhat improved. Has chronic RUQ pain. Worse when she is out of a PPI. Has intermittent right-sided chest discomfort with stress. Seen in ED 08/18/15 with chest pain. Occasional solid food dysphagia. Notices it with bread, chicken.    Past Medical History  Diagnosis Date  . Fibromyalgia   . Uterine cancer ([place])   . PUD (peptic ulcer disease)     bleeding PUD per patient, remote, On Daypro  . GERD (gastroesophageal reflux disease)     egd with RE per patient, remote  . Bulging disc     cervical, [facility]  . Osteoarthritis (arthritis due to wear and tear of joints)   . Depression 02/18/2013  . Abnormal Pap smear   . Vaginal Pap smear, abnormal   . History of abnormal cervical Pap smear 02/24/2014  . Postmenopausal 02/24/2014  . Osteoporosis, unspecified 03/03/2014  . Anxiety   . Urinary frequency 05/14/2014  . Hematuria 05/14/2014  . Osteoporosis   . Postcoital bleeding 09/15/2015    Past  Surgical History  Procedure Laterality Date  . Hysterectomy for uterine cancer      partial  . Ectopic pregnancy surgery      twice  . Esophagogastroduodenoscopy  12/01/2010    Dr. Gala Romney: mild distal ERE, antral erosions due to NSAIDS, no H.Pylori    Current Outpatient Prescriptions  Medication Sig Dispense Refill  . FLUoxetine (PROZAC) 40 MG capsule Take 40 mg by mouth 2 (two) times daily.     Marland Kitchen ibuprofen (ADVIL,MOTRIN) 200 MG tablet Take 200 mg by mouth as needed.     . Oxycodone HCl 10 MG TABS Take 10 mg by mouth 4 (four) times daily.  0  . pantoprazole (PROTONIX) 40 MG tablet TAKE 1 TABLET (40 MG TOTAL) BY MOUTH DAILY. 30 tablet 10  . pregabalin (LYRICA) 75 MG capsule Take 75 mg by mouth 3 (three) times daily.    . QUEtiapine (SEROQUEL) 25 MG tablet 25 mg as needed.   3  . zolpidem (AMBIEN CR) 12.5 MG CR tablet 12.5 mg at bedtime as needed.     Marland Kitchen dexlansoprazole (DEXILANT) 60 MG capsule Take 1 capsule (60 mg total) by mouth daily. 90 capsule 3   No current facility-administered medications for this visit.    Allergies as of 11/02/2015 - Review Complete 11/02/2015  Allergen Reaction Noted  . Cymbalta [duloxetine hcl] Other (See Comments) 02/22/2012  . Daypro [oxaprozin] Other (See Comments) 11/07/2010  .  Trazodone and nefazodone Other (See Comments) 02/22/2012  . Nucynta [tapentadol] Other (See Comments) 08/18/2015    Family History  Problem Relation Age of Onset  . Cirrhosis Father     etoh, died with liver cancer  . Diabetes Father   . Cirrhosis Paternal Uncle     multiple, etoh  . Colon cancer Maternal Uncle   . Pancreatic cancer Paternal Grandmother   . Diabetes Paternal Grandmother   . Kidney failure Mother   . Diabetes Mother   . Other Brother     crohn's disease  . Hyperlipidemia Brother   . Fibromyalgia Daughter   . Diabetes Maternal Grandmother     Social History   Social History  . Marital Status: Divorced    Spouse Name: N/A  . Number of Children:  1  . Years of Education: N/A   Occupational History  . disability    Social History Main Topics  . Smoking status: Current Some Day Smoker -- 0.50 packs/day for 30 years    Types: Cigarettes  . Smokeless tobacco: Never Used  . Alcohol Use: No     Comment: occasional social use   . Drug Use: No  . Sexual Activity: Yes    Birth Control/ Protection: Surgical     Comment: hysterectomy   Other Topics Concern  . None   Social History Narrative    Review of Systems: As mentioned in HPI.   Physical Exam: BP 116/78 mmHg  Pulse 74  Temp(Src) 98.4 F (36.9 C) (Oral)  Ht 5\' 3"  (1.6 m)  Wt 137 lb 6.4 oz (62.324 kg)  BMI 24.35 kg/m2 General:   Alert and oriented. No distress noted. Pleasant and cooperative.  Head:  Normocephalic and atraumatic. Eyes:  Conjuctiva clear without scleral icterus. Mouth:  Oral mucosa pink and moist. Good dentition. No lesions. Neck:  Left posterior neck lymph node prominence, left supraclavicular area more pronounced than right  Heart:  S1, S2 present without murmurs, rubs, or gallops. Regular rate and rhythm. Abdomen:  +BS, soft, non-tender and non-distended. No rebound or guarding. No HSM or masses noted. Msk:  Symmetrical without gross deformities. Normal posture. Extremities:  Without edema. Neurologic:  Alert and  oriented x4;  grossly normal neurologically. Psych:  Alert and cooperative. Normal mood and affect.

## 2015-11-02 NOTE — Assessment & Plan Note (Signed)
Recent episode in setting of what seems to be self-limiting illness, with near return to baseline of bowel habits at time of visit. Chronic, intermittent loose stool. No prior colonoscopy and overdue for initial screening. No first degree relatives with colon cancer.   Proceed with TCS with Dr. Gala Romney in near future: the risks, benefits, and alternatives have been discussed with the patient in detail. The patient states understanding and desires to proceed. PROPOFOL due to polypharmacy Check CBC now

## 2015-11-02 NOTE — Assessment & Plan Note (Signed)
55 year old female with chronic abdominal pain, associated nausea and vomiting, in setting of non-compliance with PPI. She has been thoroughly evaluated in the past and gallbladder does remain in situ. However, I feel this is more gastritis/GERD related with a strong anxiety overlay. Unable to rule out biliary component but less likely. Last EGD in 2012. With dysphagia symptoms, will repeat EGD now with dilation as appropriate. May need updated US abdomen in future.  Proceed with upper endoscopy/dilation in the near future with Dr. Gala Romney. The risks, benefits, and alternatives have been discussed in detail with patient. They have stated understanding and desire to proceed.  PROPOFOL due to polypharmacy Stop Protonix. Start Dexilant once daily

## 2015-11-02 NOTE — Assessment & Plan Note (Addendum)
Cervical lymphadenopathy on exam. Neck US ordered. Patient states several friends have noted this, and she is concerned. Left supraclavicular fullness noted, with posterior left neck lymphadenopathy. Unclear if this is true lymphadenopathy. Review ultrasound first.

## 2015-11-02 NOTE — Assessment & Plan Note (Signed)
In setting of likely uncontrolled GERD. Dilation as appropriate at time of EGD.

## 2015-11-03 ENCOUNTER — Telehealth: Payer: Self-pay

## 2015-11-03 LAB — CBC
Hematocrit: 40.4 % (ref 34.0–46.6)
Hemoglobin: 13.7 g/dL (ref 11.1–15.9)
MCH: 31.1 pg (ref 26.6–33.0)
MCHC: 33.9 g/dL (ref 31.5–35.7)
MCV: 92 fL (ref 79–97)
Platelets: 249 x10E3/uL (ref 150–379)
RBC: 4.41 x10E6/uL (ref 3.77–5.28)
RDW: 14.1 % (ref 12.3–15.4)
WBC: 7 x10E3/uL (ref 3.4–10.8)

## 2015-11-03 NOTE — Progress Notes (Signed)
CC'ED TO PCP 

## 2015-11-03 NOTE — Progress Notes (Signed)
Quick Note:    CBC is normal  ______

## 2015-11-03 NOTE — Telephone Encounter (Signed)
Pt's lab are back from [facility] and are in AS box

## 2015-11-08 ENCOUNTER — Ambulatory Visit (HOSPITAL_COMMUNITY): Payer: Medicare Other

## 2015-11-11 ENCOUNTER — Ambulatory Visit (HOSPITAL_COMMUNITY)
Admission: RE | Admit: 2015-11-11 | Discharge: 2015-11-11 | Disposition: A | Discharge status: Home / Self Care | Payer: Medicare Other | Source: Ambulatory Visit | Attending: Gastroenterology | Admitting: Gastroenterology

## 2015-11-11 DIAGNOSIS — R591 Generalized enlarged lymph nodes: Secondary | ICD-10-CM

## 2015-11-11 DIAGNOSIS — R221 Localized swelling, mass and lump, neck: Secondary | ICD-10-CM | POA: Diagnosis not present

## 2015-11-16 ENCOUNTER — Other Ambulatory Visit: Payer: Self-pay

## 2015-11-16 DIAGNOSIS — R222 Localized swelling, mass and lump, trunk: Secondary | ICD-10-CM

## 2015-11-16 NOTE — Patient Instructions (Signed)
Susan Baker  11/16/2015     @PREFPERIOPPHARMACY @   Your procedure is scheduled on  11/22/2015   Report to [facility] ([facility]) at  645  A.M.  Call this number if you have problems the morning of surgery:  [phone]   Remember:  Do not eat food or drink liquids after midnight.  Take these medicines the morning of surgery with A SIP OF WATER  Prozac, oxycodone, protonix, lyrica, seroquel.   Do not wear jewelry, make-up or nail polish.  Do not wear lotions, powders, or perfumes.  You may wear deodorant.  Do not shave 48 hours prior to surgery.  Men may shave face and neck.  Do not bring valuables to the hospital.  [facility] is not responsible for any belongings or valuables.  Contacts, dentures or bridgework may not be worn into surgery.  Leave your suitcase in the car.  After surgery it may be brought to your room.  For patients admitted to the hospital, discharge time will be determined by your treatment team.  Patients discharged the day of surgery will not be allowed to drive home.   Name and phone number of your driver:   family Special instructions:  Follow the diet and prep instructions given to you by Dr Roseanne Kaufman office.  Please read over the following fact sheets that you were given. Coughing and Deep Breathing, Surgical Site Infection Prevention, Anesthesia Post-op Instructions and Care and Recovery After Surgery      Esophagogastroduodenoscopy Esophagogastroduodenoscopy (EGD) is a procedure that is used to examine the lining of the esophagus, stomach, and first part of the small intestine (duodenum). A long, flexible, lighted tube with a camera attached (endoscope) is inserted down the throat to view these organs. This procedure is done to detect problems or abnormalities, such as inflammation, bleeding, ulcers, or growths, in order to treat them. The procedure lasts 5-20 minutes. It is usually an outpatient procedure, but it may need to be  performed in a hospital in emergency cases. LET [facility] CARE PROVIDER KNOW ABOUT:  Any allergies you have.  All medicines you are taking, including vitamins, herbs, eye drops, creams, and over-the-counter medicines.  Previous problems you or members of your family have had with the use of anesthetics.  Any blood disorders you have.  Previous surgeries you have had.  Medical conditions you have. RISKS AND COMPLICATIONS Generally, this is a safe procedure. However, problems can occur and include:  Infection.  Bleeding.  Tearing (perforation) of the esophagus, stomach, or duodenum.  Difficulty breathing or not being able to breathe.  Excessive sweating.  Spasms of the larynx.  Slowed heartbeat.  Low blood pressure. BEFORE THE PROCEDURE  Do not eat or drink anything after midnight on the night before the procedure or as directed by your health care provider.  Do not take your regular medicines before the procedure if your health care provider asks you not to. Ask your health care provider about changing or stopping those medicines.  If you wear dentures, be prepared to remove them before the procedure.  Arrange for someone to drive you home after the procedure. PROCEDURE  A numbing medicine (local anesthetic) may be sprayed in your throat for comfort and to stop you from gagging or coughing.  You will have an IV tube inserted in a vein in your hand or arm. You will receive medicines and fluids through this tube.  You will be given a medicine to relax you (sedative).  A pain reliever will be given through the IV tube.  A mouth guard may be placed in your mouth to protect your teeth and to keep you from biting on the endoscope.  You will be asked to lie on your left side.  The endoscope will be inserted down your throat and into your esophagus, stomach, and duodenum.  Air will be put through the endoscope to allow your health care provider to clearly view the  lining of your esophagus.  The lining of your esophagus, stomach, and duodenum will be examined. During the exam, your health care provider may:  Remove tissue to be examined under a microscope (biopsy) for inflammation, infection, or other medical problems.  Remove growths.  Remove objects (foreign bodies) that are stuck.  Treat any bleeding with medicines or other devices that stop tissues from bleeding (hot cautery, clipping devices).  Widen (dilate) or stretch narrowed areas of your esophagus and stomach.  The endoscope will be withdrawn. AFTER THE PROCEDURE  You will be taken to a recovery area for observation. Your blood pressure, heart rate, breathing rate, and blood oxygen level will be monitored often until the medicines you were given have worn off.  Do not eat or drink anything until the numbing medicine has worn off and your gag reflex has returned. You may choke.  Your health care provider should be able to discuss his or her findings with you. It will take longer to discuss the test results if any biopsies were taken.   This information is not intended to replace advice given to you by your health care provider. Make sure you discuss any questions you have with your health care provider.   Document Released: 10/20/2004 Document Revised: 07/10/2014 Document Reviewed: 05/22/2012 Elsevier Interactive Patient Education 2016 [place]. Esophagogastroduodenoscopy, Care After Refer to this sheet in the next few weeks. These instructions provide you with information about caring for yourself after your procedure. Your health care provider may also give you more specific instructions. Your treatment has been planned according to current medical practices, but problems sometimes occur. Call your health care provider if you have any problems or questions after your procedure. WHAT TO EXPECT AFTER THE PROCEDURE After your procedure, it is typical to feel:  Soreness in your  throat.  Pain with swallowing.  Sick to your stomach (nauseous).  Bloated.  Dizzy.  Fatigued. HOME CARE INSTRUCTIONS  Do not eat or drink anything until the numbing medicine (local anesthetic) has worn off and your gag reflex has returned. You will know that the local anesthetic has worn off when you can swallow comfortably.  Do not drive or operate machinery until directed by your health care provider.  Take medicines only as directed by your health care provider. SEEK MEDICAL CARE IF:   You cannot stop coughing.  You are not urinating at all or less than usual. SEEK IMMEDIATE MEDICAL CARE IF:  You have difficulty swallowing.  You cannot eat or drink.  You have worsening throat or chest pain.  You have dizziness or lightheadedness or you faint.  You have nausea or vomiting.  You have chills.  You have a fever.  You have severe abdominal pain.  You have black, tarry, or bloody stools.   This information is not intended to replace advice given to you by your health care provider. Make sure you discuss any questions you have with your health care provider.  Document Released: 06/05/2012 Document Revised: 07/10/2014 Document Reviewed: 06/05/2012 Elsevier Interactive Patient Education 2016 [employer] Esophageal Dilatation Esophageal dilatation is a procedure to open a blocked or narrowed part of the esophagus. The esophagus is the long tube in your throat that carries food and liquid from your mouth to your stomach. The procedure is also called esophageal dilation.  You may need this procedure if you have a buildup of scar tissue in your esophagus that makes it difficult, painful, or even impossible to swallow. This can be caused by gastroesophageal reflux disease (GERD). In rare cases, people need this procedure because they have cancer of the esophagus or a problem with the way food moves through the esophagus. Sometimes you may need to have another dilatation to  enlarge the opening of the esophagus gradually. LET [facility] CARE PROVIDER KNOW ABOUT:   Any allergies you have.  All medicines you are taking, including vitamins, herbs, eye drops, creams, and over-the-counter medicines.  Previous problems you or members of your family have had with the use of anesthetics.  Any blood disorders you have.  Previous surgeries you have had.  Medical conditions you have.  Any antibiotic medicines you are required to take before dental procedures. RISKS AND COMPLICATIONS Generally, this is a safe procedure. However, problems can occur and include:  Bleeding from a tear in the lining of the esophagus.  A hole (perforation) in the esophagus. BEFORE THE PROCEDURE  Do not eat or drink anything after midnight on the night before the procedure or as directed by your health care provider.  Ask your health care provider about changing or stopping your regular medicines. This is especially important if you are taking diabetes medicines or blood thinners.  Plan to have someone take you home after the procedure. PROCEDURE   You will be given a medicine that makes you relaxed and sleepy (sedative).  A medicine may be sprayed or gargled to numb the back of the throat.  Your health care provider can use various instruments to do an esophageal dilatation. During the procedure, the instrument used will be placed in your mouth and passed down into your esophagus. Options include:  Simple dilators. This instrument is carefully placed in the esophagus to stretch it.  Guided wire bougies. In this method, a flexible tube (endoscope) is used to insert a wire into the esophagus. The dilator is passed over this wire to enlarge the esophagus. Then the wire is removed.  Balloon dilators. An endoscope with a small balloon at the end is passed down into the esophagus. Inflating the balloon gently stretches the esophagus and opens it up. AFTER THE PROCEDURE  Your blood  pressure, heart rate, breathing rate, and blood oxygen level will be monitored often until the medicines you were given have worn off.  Your throat may feel slightly sore and will probably still feel numb. This will improve slowly over time.  You will not be allowed to eat or drink until the throat numbness has resolved.  If this is a same-day procedure, you may be allowed to go home once you have been able to drink, urinate, and sit on the edge of the bed without nausea or dizziness.  If this is a same-day procedure, you should have a friend or family member with you for the next 24 hours after the procedure.   This information is not intended to replace advice given to you by your health care provider. Make sure you discuss any questions you have  with your health care provider.   Document Released: 08/10/2005 Document Revised: 07/10/2014 Document Reviewed: 10/29/2013 Elsevier Interactive Patient Education 2016 [employer]. Colonoscopy A colonoscopy is an exam to look at the entire large intestine (colon). This exam can help find problems such as tumors, polyps, inflammation, and areas of bleeding. The exam takes about 1 hour.  LET [facility] CARE PROVIDER KNOW ABOUT:   Any allergies you have.  All medicines you are taking, including vitamins, herbs, eye drops, creams, and over-the-counter medicines.  Previous problems you or members of your family have had with the use of anesthetics.  Any blood disorders you have.  Previous surgeries you have had.  Medical conditions you have. RISKS AND COMPLICATIONS  Generally, this is a safe procedure. However, as with any procedure, complications can occur. Possible complications include:  Bleeding.  Tearing or rupture of the colon wall.  Reaction to medicines given during the exam.  Infection (rare). BEFORE THE PROCEDURE   Ask your health care provider about changing or stopping your regular medicines.  You may be prescribed an  oral bowel prep. This involves drinking a large amount of medicated liquid, starting the day before your procedure. The liquid will cause you to have multiple loose stools until your stool is almost clear or light green. This cleans out your colon in preparation for the procedure.  Do not eat or drink anything else once you have started the bowel prep, unless your health care provider tells you it is safe to do so.  Arrange for someone to drive you home after the procedure. PROCEDURE   You will be given medicine to help you relax (sedative).  You will lie on your side with your knees bent.  A long, flexible tube with a light and camera on the end (colonoscope) will be inserted through the rectum and into the colon. The camera sends video back to a computer screen as it moves through the colon. The colonoscope also releases carbon dioxide gas to inflate the colon. This helps your health care provider see the area better.  During the exam, your health care provider may take a small tissue sample (biopsy) to be examined under a microscope if any abnormalities are found.  The exam is finished when the entire colon has been viewed. AFTER THE PROCEDURE   Do not drive for 24 hours after the exam.  You may have a small amount of blood in your stool.  You may pass moderate amounts of gas and have mild abdominal cramping or bloating. This is caused by the gas used to inflate your colon during the exam.  Ask when your test results will be ready and how you will get your results. Make sure you get your test results.   This information is not intended to replace advice given to you by your health care provider. Make sure you discuss any questions you have with your health care provider.   Document Released: 06/16/2000 Document Revised: 04/09/2013 Document Reviewed: 02/24/2013 Elsevier Interactive Patient Education 2016 [employer] Colonoscopy, Care After Refer to this sheet in the next few  weeks. These instructions provide you with information on caring for yourself after your procedure. Your health care provider may also give you more specific instructions. Your treatment has been planned according to current medical practices, but problems sometimes occur. Call your health care provider if you have any problems or questions after your procedure. WHAT TO EXPECT AFTER THE PROCEDURE  After your procedure, it is typical  to have the following:  A small amount of blood in your stool.  Moderate amounts of gas and mild abdominal cramping or bloating. HOME CARE INSTRUCTIONS  Do not drive, operate machinery, or sign important documents for 24 hours.  You may shower and resume your regular physical activities, but move at a slower pace for the first 24 hours.  Take frequent rest periods for the first 24 hours.  Walk around or put a warm pack on your abdomen to help reduce abdominal cramping and bloating.  Drink enough fluids to keep your urine clear or pale yellow.  You may resume your normal diet as instructed by your health care provider. Avoid heavy or fried foods that are hard to digest.  Avoid drinking alcohol for 24 hours or as instructed by your health care provider.  Only take over-the-counter or prescription medicines as directed by your health care provider.  If a tissue sample (biopsy) was taken during your procedure:  Do not take aspirin or blood thinners for 7 days, or as instructed by your health care provider.  Do not drink alcohol for 7 days, or as instructed by your health care provider.  Eat soft foods for the first 24 hours. SEEK MEDICAL CARE IF: You have persistent spotting of blood in your stool 2-3 days after the procedure. SEEK IMMEDIATE MEDICAL CARE IF:  You have more than a small spotting of blood in your stool.  You pass large blood clots in your stool.  Your abdomen is swollen (distended).  You have nausea or vomiting.  You have a  fever.  You have increasing abdominal pain that is not relieved with medicine.   This information is not intended to replace advice given to you by your health care provider. Make sure you discuss any questions you have with your health care provider.   Document Released: 02/01/2004 Document Revised: 04/09/2013 Document Reviewed: 02/24/2013 Elsevier Interactive Patient Education 2016 [employer] Inc. PATIENT INSTRUCTIONS POST-ANESTHESIA  IMMEDIATELY FOLLOWING SURGERY:  Do not drive or operate machinery for the first twenty four hours after surgery.  Do not make any important decisions for twenty four hours after surgery or while taking narcotic pain medications or sedatives.  If you develop intractable nausea and vomiting or a severe headache please notify your doctor immediately.  FOLLOW-UP:  Please make an appointment with your surgeon as instructed. You do not need to follow up with anesthesia unless specifically instructed to do so.  WOUND CARE INSTRUCTIONS (if applicable):  Keep a dry clean dressing on the anesthesia/puncture wound site if there is drainage.  Once the wound has quit draining you may leave it open to air.  Generally you should leave the bandage intact for twenty four hours unless there is drainage.  If the epidural site drains for more than 36-48 hours please call the anesthesia department.  QUESTIONS?:  Please feel free to call your physician or the hospital operator if you have any questions, and they will be happy to assist you.

## 2015-11-16 NOTE — Progress Notes (Signed)
Quick Note:  She has a soft tissue mass-like area in the subcutaneous fat. We need to proceed with CT neck with contrast ASAP. I've routed to [place] and the clinical pool to schedule. ______

## 2015-11-17 ENCOUNTER — Ambulatory Visit (HOSPITAL_COMMUNITY)
Admission: RE | Admit: 2015-11-17 | Discharge: 2015-11-17 | Disposition: A | Discharge status: Home / Self Care | Payer: Medicare Other | Source: Ambulatory Visit | Attending: Gastroenterology | Admitting: Gastroenterology

## 2015-11-17 ENCOUNTER — Encounter (HOSPITAL_COMMUNITY): Payer: Self-pay

## 2015-11-17 ENCOUNTER — Encounter (HOSPITAL_COMMUNITY)
Admission: RE | Admit: 2015-11-17 | Discharge: 2015-11-17 | Disposition: A | Discharge status: Home / Self Care | Payer: Medicare Other | Source: Ambulatory Visit | Attending: Internal Medicine | Admitting: Internal Medicine

## 2015-11-17 DIAGNOSIS — R222 Localized swelling, mass and lump, trunk: Secondary | ICD-10-CM | POA: Insufficient documentation

## 2015-11-17 DIAGNOSIS — R221 Localized swelling, mass and lump, neck: Secondary | ICD-10-CM | POA: Diagnosis not present

## 2015-11-17 LAB — CBC WITH DIFFERENTIAL/PLATELET
BASOS ABS: 0 10*3/uL (ref 0.0–0.1)
BASOS PCT: 0 %
Eosinophils Absolute: 0.3 10*3/uL (ref 0.0–0.7)
Eosinophils Relative: 4 %
HCT: 41 % (ref 36.0–46.0)
Hemoglobin: 14 g/dL (ref 12.0–15.0)
LYMPHS PCT: 49 %
Lymphs Abs: 3.1 10*3/uL (ref 0.7–4.0)
MCH: 32.2 pg (ref 26.0–34.0)
MCHC: 34.1 g/dL (ref 30.0–36.0)
MCV: 94.3 fL (ref 78.0–100.0)
MONO ABS: 0.5 10*3/uL (ref 0.1–1.0)
Monocytes Relative: 8 %
Neutro Abs: 2.5 10*3/uL (ref 1.7–7.7)
Neutrophils Relative %: 39 %
PLATELETS: 224 10*3/uL (ref 150–400)
RBC: 4.35 MIL/uL (ref 3.87–5.11)
RDW: 13 % (ref 11.5–15.5)
WBC: 6.4 10*3/uL (ref 4.0–10.5)

## 2015-11-17 LAB — BASIC METABOLIC PANEL
ANION GAP: 5 (ref 5–15)
BUN: 11 mg/dL (ref 6–20)
CALCIUM: 9.7 mg/dL (ref 8.9–10.3)
CO2: 29 mmol/L (ref 22–32)
CREATININE: 0.82 mg/dL (ref 0.44–1.00)
Chloride: 104 mmol/L (ref 101–111)
GFR calc Af Amer: >60 mL/min (ref >60)
GLUCOSE: 68 mg/dL (ref 65–99)
Potassium: 4.3 mmol/L (ref 3.5–5.1)
Sodium: 138 mmol/L (ref 135–145)

## 2015-11-17 MED ORDER — IOPAMIDOL (ISOVUE-300) INJECTION 61%
75.0000 mL | Freq: Once | INTRAVENOUS | Status: AC | PRN
  Administered 2015-11-17: 75 mL via INTRAVENOUS

## 2015-11-17 NOTE — Pre-Procedure Instructions (Signed)
Patient given information to sign up for my chart at home. 

## 2015-11-18 ENCOUNTER — Ambulatory Visit (HOSPITAL_COMMUNITY): Payer: Medicare Other

## 2015-11-22 ENCOUNTER — Ambulatory Visit (HOSPITAL_COMMUNITY)
Admission: RE | Admit: 2015-11-22 | Discharge: 2015-11-22 | Disposition: A | Discharge status: Home / Self Care | Payer: Medicare Other | Source: Ambulatory Visit | Attending: Internal Medicine | Admitting: Internal Medicine

## 2015-11-22 ENCOUNTER — Encounter (HOSPITAL_COMMUNITY): Payer: Self-pay | Admitting: *Deleted

## 2015-11-22 ENCOUNTER — Encounter (HOSPITAL_COMMUNITY)
Admission: RE | Disposition: A | Discharge status: Home / Self Care | Payer: Self-pay | Source: Ambulatory Visit | Attending: Internal Medicine

## 2015-11-22 ENCOUNTER — Ambulatory Visit (HOSPITAL_COMMUNITY): Payer: Medicare Other | Admitting: Anesthesiology

## 2015-11-22 DIAGNOSIS — Z800 Family history of malignant neoplasm of digestive organs: Secondary | ICD-10-CM | POA: Diagnosis not present

## 2015-11-22 DIAGNOSIS — K921 Melena: Secondary | ICD-10-CM | POA: Insufficient documentation

## 2015-11-22 DIAGNOSIS — K210 Gastro-esophageal reflux disease with esophagitis: Secondary | ICD-10-CM | POA: Insufficient documentation

## 2015-11-22 DIAGNOSIS — F17210 Nicotine dependence, cigarettes, uncomplicated: Secondary | ICD-10-CM | POA: Diagnosis not present

## 2015-11-22 DIAGNOSIS — Z808 Family history of malignant neoplasm of other organs or systems: Secondary | ICD-10-CM | POA: Diagnosis not present

## 2015-11-22 DIAGNOSIS — R1011 Right upper quadrant pain: Secondary | ICD-10-CM | POA: Diagnosis not present

## 2015-11-22 DIAGNOSIS — F329 Major depressive disorder, single episode, unspecified: Secondary | ICD-10-CM | POA: Insufficient documentation

## 2015-11-22 DIAGNOSIS — Z8542 Personal history of malignant neoplasm of other parts of uterus: Secondary | ICD-10-CM | POA: Insufficient documentation

## 2015-11-22 DIAGNOSIS — R1310 Dysphagia, unspecified: Secondary | ICD-10-CM

## 2015-11-22 DIAGNOSIS — K449 Diaphragmatic hernia without obstruction or gangrene: Secondary | ICD-10-CM | POA: Diagnosis not present

## 2015-11-22 DIAGNOSIS — Z90710 Acquired absence of both cervix and uterus: Secondary | ICD-10-CM | POA: Insufficient documentation

## 2015-11-22 DIAGNOSIS — K644 Residual hemorrhoidal skin tags: Secondary | ICD-10-CM | POA: Insufficient documentation

## 2015-11-22 DIAGNOSIS — Z79899 Other long term (current) drug therapy: Secondary | ICD-10-CM | POA: Diagnosis not present

## 2015-11-22 DIAGNOSIS — R1319 Other dysphagia: Secondary | ICD-10-CM | POA: Insufficient documentation

## 2015-11-22 DIAGNOSIS — F419 Anxiety disorder, unspecified: Secondary | ICD-10-CM | POA: Diagnosis not present

## 2015-11-22 DIAGNOSIS — K642 Third degree hemorrhoids: Secondary | ICD-10-CM | POA: Diagnosis not present

## 2015-11-22 DIAGNOSIS — K625 Hemorrhage of anus and rectum: Secondary | ICD-10-CM | POA: Diagnosis not present

## 2015-11-22 DIAGNOSIS — R109 Unspecified abdominal pain: Secondary | ICD-10-CM | POA: Diagnosis not present

## 2015-11-22 DIAGNOSIS — R197 Diarrhea, unspecified: Secondary | ICD-10-CM | POA: Diagnosis not present

## 2015-11-22 DIAGNOSIS — M797 Fibromyalgia: Secondary | ICD-10-CM | POA: Diagnosis not present

## 2015-11-22 HISTORY — PX: ESOPHAGEAL DILATION: SHX303

## 2015-11-22 HISTORY — PX: COLONOSCOPY WITH PROPOFOL: SHX5780

## 2015-11-22 HISTORY — PX: ESOPHAGOGASTRODUODENOSCOPY (EGD) WITH PROPOFOL: SHX5813

## 2015-11-22 SURGERY — COLONOSCOPY WITH PROPOFOL
Anesthesia: Monitor Anesthesia Care

## 2015-11-22 MED ORDER — LIDOCAINE VISCOUS 2 % MT SOLN: 5.0000 mL | Freq: Two times a day (BID) | OROMUCOSAL | Status: DC

## 2015-11-22 MED ORDER — FENTANYL CITRATE (PF) 100 MCG/2ML IJ SOLN
INTRAMUSCULAR | Status: AC
  Filled 2015-11-22: qty 2

## 2015-11-22 MED ORDER — PROPOFOL 10 MG/ML IV BOLUS
INTRAVENOUS | Status: AC
  Filled 2015-11-22: qty 40

## 2015-11-22 MED ORDER — ONDANSETRON HCL 4 MG/2ML IJ SOLN
4.0000 mg | Freq: Once | INTRAMUSCULAR | Status: AC
  Administered 2015-11-22: 4 mg via INTRAVENOUS

## 2015-11-22 MED ORDER — LACTATED RINGERS IV SOLN
INTRAVENOUS | Status: DC
  Administered 2015-11-22: 08:00:00 via INTRAVENOUS

## 2015-11-22 MED ORDER — MIDAZOLAM HCL 2 MG/2ML IJ SOLN
INTRAMUSCULAR | Status: AC
  Filled 2015-11-22: qty 2

## 2015-11-22 MED ORDER — MIDAZOLAM HCL 5 MG/5ML IJ SOLN
INTRAMUSCULAR | Status: DC | PRN
  Administered 2015-11-22: 2 mg via INTRAVENOUS

## 2015-11-22 MED ORDER — ONDANSETRON HCL 4 MG/2ML IJ SOLN: 4.0000 mg | Freq: Once | INTRAMUSCULAR | Status: DC | PRN

## 2015-11-22 MED ORDER — ONDANSETRON HCL 4 MG/2ML IJ SOLN
INTRAMUSCULAR | Status: AC
  Filled 2015-11-22: qty 2

## 2015-11-22 MED ORDER — GLYCOPYRROLATE 0.2 MG/ML IJ SOLN
0.2000 mg | Freq: Once | INTRAMUSCULAR | Status: AC
  Administered 2015-11-22: 0.2 mg via INTRAVENOUS

## 2015-11-22 MED ORDER — MIDAZOLAM HCL 2 MG/2ML IJ SOLN
1.0000 mg | INTRAMUSCULAR | Status: DC | PRN
  Administered 2015-11-22: 2 mg via INTRAVENOUS

## 2015-11-22 MED ORDER — FENTANYL CITRATE (PF) 100 MCG/2ML IJ SOLN
25.0000 ug | INTRAMUSCULAR | Status: AC
  Administered 2015-11-22: 25 ug via INTRAVENOUS

## 2015-11-22 MED ORDER — GLYCOPYRROLATE 0.2 MG/ML IJ SOLN
INTRAMUSCULAR | Status: AC
  Filled 2015-11-22: qty 1

## 2015-11-22 MED ORDER — LIDOCAINE VISCOUS 2 % MT SOLN
OROMUCOSAL | Status: AC
  Filled 2015-11-22: qty 15

## 2015-11-22 MED ORDER — FENTANYL CITRATE (PF) 100 MCG/2ML IJ SOLN: 25.0000 ug | INTRAMUSCULAR | Status: DC | PRN

## 2015-11-22 MED ORDER — PROPOFOL 500 MG/50ML IV EMUL
INTRAVENOUS | Status: DC | PRN
  Administered 2015-11-22: 125 ug/kg/min via INTRAVENOUS
  Administered 2015-11-22: 08:00:00 via INTRAVENOUS

## 2015-11-22 NOTE — Op Note (Signed)
[facility] Patient Name: Susan Baker Procedure Date: 11/22/2015 8:19 AM MRN: [id number] Date of Birth: Nov 24, 1960 Attending MD: Norvel Richards , MD CSN: [id number] Age: 55 Admit Type: Outpatient Procedure:                Colonoscopy - First ever examination; hematochezia Indications:              Hematochezia Providers:                Norvel Richards, MD, Lurline Del, RN, Isabella Stalling, Technician Referring MD:              Medicines:                Monitored Anesthesia Care Complications:            No immediate complications. Estimated Blood Loss:     Estimated blood loss: none. Procedure:                Pre-Anesthesia Assessment:                           - Prior to the procedure, a History and Physical                            was performed, and patient medications and                            allergies were reviewed. The patient's tolerance of                            previous anesthesia was also reviewed. The risks                            and benefits of the procedure and the sedation                            options and risks were discussed with the patient.                            All questions were answered, and informed consent                            was obtained. Prior Anticoagulants: The patient has                            taken no previous anticoagulant or antiplatelet                            agents. ASA Grade Assessment: II - A patient with                            mild systemic disease. After reviewing the risks  and benefits, the patient was deemed in                            satisfactory condition to undergo the procedure.                           - Prior to the procedure, a History and Physical                            was performed, and patient medications and                            allergies were reviewed. The patient's tolerance of   previous anesthesia was also reviewed. The risks                            and benefits of the procedure and the sedation                            options and risks were discussed with the patient.                            All questions were answered, and informed consent                            was obtained. Prior Anticoagulants: The patient has                            taken no previous anticoagulant or antiplatelet                            agents. ASA Grade Assessment: II - A patient with                            mild systemic disease. After reviewing the risks                            and benefits, the patient was deemed in                            satisfactory condition to undergo the procedure.                           After obtaining informed consent, the colonoscope                            was passed under direct vision. Throughout the                            procedure, the patient's blood pressure, pulse, and                            oxygen saturations were monitored continuously. The  [id number]) scope was introduced through                            the anus and advanced to the the terminal ileum.                            The colonoscopy was performed without difficulty.                            The patient tolerated the procedure well. The                            quality of the bowel preparation was adequate. The                            terminal ileum, ileocecal valve, appendiceal                            orifice, and rectum were photographed. The entire                            colon was well visualized. Scope In: 8:21:36 AM Scope Out: 8:33:09 AM Scope Withdrawal Time: 0 hours 6 minutes 56 seconds  Total Procedure Duration: 0 hours 11 minutes 33 seconds  Findings:      The perianal and digital rectal examinations were normal. Pertinent       negatives include normal sphincter tone. Soft grade 3  hemorrhoid tissue       present.      The colon (entire examined portion) appeared normal. Estimated blood       loss: none.      The terminal ileum appeared normal.      Non-bleeding external internal hemorrhoids were found during perianal       exam. The hemorrhoids were medium-sized and Grade III (internal       hemorrhoids that prolapse but require manual reduction). Impression:               - The entire examined colon is normal.                           - The examined portion of the ileum was normal.                           - Non-bleeding external internal hemorrhoids.                           - No specimens collected. Moderate Sedation:      Moderate (conscious) sedation was personally administered by an       anesthesia professional. The following parameters were monitored: oxygen       saturation, heart rate, blood pressure, respiratory rate, EKG, adequacy       of pulmonary ventilation, and response to care. Total physician       intraservice time was 29 minutes. Recommendation:           - Patient has a contact number available for  emergencies. The signs and symptoms of potential                            delayed complications were discussed with the                            patient. Return to normal activities tomorrow.                            Written discharge instructions were provided to the                            patient.                           - Advance diet as tolerated today.                           - Continue present medications.                           - Repeat colonoscopy in 10 years for screening                            purposes.                           - Return to GI office in 6 weeks. Procedure Code(s):        --- Professional ---                           [phone], Colonoscopy, flexible; diagnostic, including                            collection of specimen(s) by brushing or washing,                             when performed (separate procedure) Diagnosis Code(s):        --- Professional ---                           K92.1, Melena (includes Hematochezia) CPT copyright 2016 [employer]. All rights reserved. The codes documented in this report are preliminary and upon coder review may  be revised to meet current compliance requirements. [name] Eadie Repetto, MD Norvel Richards, MD 11/22/2015 8:45:07 AM This report has been signed electronically. Number of Addenda: 0

## 2015-11-22 NOTE — Transfer of Care (Signed)
Immediate Anesthesia Transfer of Care Note  Patient: Susan Baker  Procedure(s) Performed: Procedure(s) with comments: COLONOSCOPY WITH PROPOFOL (N/A) - 0815 ESOPHAGOGASTRODUODENOSCOPY (EGD) WITH PROPOFOL (N/A) ESOPHAGEAL DILATION (N/A)  Patient Location: PACU  Anesthesia Type:MAC  Level of Consciousness: awake and patient cooperative  Airway & Oxygen Therapy: Patient Spontanous Breathing and Patient connected to face mask oxygen  Post-op Assessment: Report given to RN, Post -op Vital signs reviewed and stable and Patient moving all extremities  Post vital signs: Reviewed and stable  Last Vitals:  Filed Vitals:   11/22/15 0755 11/22/15 0800  BP: 121/72   Pulse:    Resp: 20 19    Last Pain:  Filed Vitals:   11/22/15 0838  PainSc: 3       Patients Stated Pain Goal: 7 ([id number])  Complications: No apparent anesthesia complications

## 2015-11-22 NOTE — Discharge Instructions (Signed)
Colonoscopy Discharge Instructions  Read the instructions outlined below and refer to this sheet in the next few weeks. These discharge instructions provide you with general information on caring for yourself after you leave the hospital. Your doctor may also give you specific instructions. While your treatment has been planned according to the most current medical practices available, unavoidable complications occasionally occur. If you have any problems or questions after discharge, call Dr. Gala Romney at [phone]. ACTIVITY  You may resume your regular activity, but move at a slower pace for the next 24 hours.   Take frequent rest periods for the next 24 hours.   Walking will help get rid of the air and reduce the bloated feeling in your belly (abdomen).   No driving for 24 hours (because of the medicine (anesthesia) used during the test).    Do not sign any important legal documents or operate any machinery for 24 hours (because of the anesthesia used during the test).  NUTRITION  Drink plenty of fluids.   You may resume your normal diet as instructed by your doctor.   Begin with a light meal and progress to your normal diet. Heavy or fried foods are harder to digest and may make you feel sick to your stomach (nauseated).   Avoid alcoholic beverages for 24 hours or as instructed.  MEDICATIONS  You may resume your normal medications unless your doctor tells you otherwise.  WHAT YOU CAN EXPECT TODAY  Some feelings of bloating in the abdomen.   Passage of more gas than usual.   Spotting of blood in your stool or on the toilet paper.  IF YOU HAD POLYPS REMOVED DURING THE COLONOSCOPY:  No aspirin products for 7 days or as instructed.   No alcohol for 7 days or as instructed.   Eat a soft diet for the next 24 hours.  FINDING OUT THE RESULTS OF YOUR TEST Not all test results are available during your visit. If your test results are not back during the visit, make an appointment  with your caregiver to find out the results. Do not assume everything is normal if you have not heard from your caregiver or the medical facility. It is important for you to follow up on all of your test results.  SEEK IMMEDIATE MEDICAL ATTENTION IF:  You have more than a spotting of blood in your stool.   Your belly is swollen (abdominal distention).   You are nauseated or vomiting.   You have a temperature over 101.   You have abdominal pain or discomfort that is severe or gets worse throughout the day. The risks, benefits, limitations, alternatives and imponderables have been reviewed with the patient. Potential for esophageal dilation, biopsy, etc. have also been reviewed.  Questions have been answered. All parties agreeable.    Continue Dexilant 60 mg daily  Hemorrhoid and GERD information provided  Hemorrhoid banding pamphlets provided  Office visit in 6 weeks for hemorrhoid banding     Nonsurgical Procedures for Hemorrhoids Nonsurgical procedures can be used to treat hemorrhoids. Hemorrhoids are swollen veins that are inside the rectum (internal hemorrhoids) or around the anus (external hemorrhoids). They are caused by increased pressure in the anal area. This pressure may result from straining to have a bowel movement (constipation), diarrhea, pregnancy, obesity, anal sex, or sitting for long periods of time. Hemorrhoids can cause symptoms such as pain and bleeding. Various procedures may be performed if diet changes, lifestyle changes, and other treatments do not help your symptoms.  Some of these procedures do not involve surgery. Three common nonsurgical procedures are: Rubber band ligation. Rubber bands are used to cut off the blood supply to the hemorrhoids. Sclerotherapy. Medicine is injected into the hemorrhoids to shrink them. Infrared coagulation. A type of light energy is used to get rid of the hemorrhoids. LET [facility] CARE PROVIDER KNOW ABOUT: Any allergies you  have. All medicines you are taking, including vitamins, herbs, eye drops, creams, and over-the-counter medicines. Previous problems you or members of your family have had with the use of anesthetics. Any blood disorders you have. Previous surgeries you have had. Any medical conditions you have. Whether you are pregnant or may be pregnant. RISKS AND COMPLICATIONS Generally, this is a safe procedure. However, problems may occur, including: Infection. Bleeding. Pain. BEFORE THE PROCEDURE Ask your health care provider about: Changing or stopping your regular medicines. This is especially important if you are taking diabetes medicines or blood thinners. Taking medicines such as aspirin and ibuprofen. These medicines can thin your blood. Do not take these medicines before your procedure if your health care provider instructs you not to. You may need to have a procedure to examine the inside of your colon with a scope (colonoscopy). Your health care provider may do this to make sure that there are no other causes for your bleeding or pain. PROCEDURE Your health care provider will clean your rectal area with a rinsing solution. A lubricating jelly may be placed into your rectum. The jelly may contain a medicine to numb the area (local anesthetic). Your health care provider will insert a short scope (anoscope) into your rectum to examine the hemorrhoids. One of the following techniques will be used. Rubber Band Ligation Your health care provider will place medical instruments through the scope to put rubber bands around the base of your hemorrhoids. The bands will cut off the blood supply to the hemorrhoids. The hemorrhoids will fall off after several days. Sclerotherapy Your health care provider will inject medicine through the scope into your hemorrhoids. This will cause them to shrink and dry up. Infrared Coagulation Your health care provider will shine a type of light through the scope onto  your hemorrhoids. This light will generate energy (infrared radiation). It will cause the hemorrhoids to scar and then fall off. Each of these procedures may vary among health care providers and hospitals. AFTER THE PROCEDURE You will be monitored to make sure that you have no bleeding. Return to your normal activities as told by your health care provider.   This information is not intended to replace advice given to you by your health care provider. Make sure you discuss any questions you have with your health care provider.   Document Released: 04/16/2009 Document Revised: 03/10/2015 Document Reviewed: 09/14/2014 Elsevier Interactive Patient Education 2016 [employer]. Gastroesophageal Reflux Disease, Adult Normally, food travels down the esophagus and stays in the stomach to be digested. However, when a person has gastroesophageal reflux disease (GERD), food and stomach acid move back up into the esophagus. When this happens, the esophagus becomes sore and inflamed. Over time, GERD can create small holes (ulcers) in the lining of the esophagus.  CAUSES This condition is caused by a problem with the muscle between the esophagus and the stomach (lower esophageal sphincter, or LES). Normally, the LES muscle closes after food passes through the esophagus to the stomach. When the LES is weakened or abnormal, it does not close properly, and that allows food and stomach acid to  go back up into the esophagus. The LES can be weakened by certain dietary substances, medicines, and medical conditions, including: Tobacco use. Pregnancy. Having a hiatal hernia. Heavy alcohol use. Certain foods and beverages, such as coffee, chocolate, onions, and peppermint. RISK FACTORS This condition is more likely to develop in: People who have an increased body weight. People who have connective tissue disorders. People who use NSAID medicines. SYMPTOMS Symptoms of this condition include: Heartburn. Difficult  or painful swallowing. The feeling of having a lump in the throat. Abitter taste in the mouth. Bad breath. Having a large amount of saliva. Having an upset or bloated stomach. Belching. Chest pain. Shortness of breath or wheezing. Ongoing (chronic) cough or a night-time cough. Wearing away of tooth enamel. Weight loss. Different conditions can cause chest pain. Make sure to see your health care provider if you experience chest pain. DIAGNOSIS Your health care provider will take a medical history and perform a physical exam. To determine if you have mild or severe GERD, your health care provider may also monitor how you respond to treatment. You may also have other tests, including: An endoscopy toexamine your stomach and esophagus with a small camera. A test thatmeasures the acidity level in your esophagus. A test thatmeasures how much pressure is on your esophagus. A barium swallow or modified barium swallow to show the shape, size, and functioning of your esophagus. TREATMENT The goal of treatment is to help relieve your symptoms and to prevent complications. Treatment for this condition may vary depending on how severe your symptoms are. Your health care provider may recommend: Changes to your diet. Medicine. Surgery. HOME CARE INSTRUCTIONS Diet Follow a diet as recommended by your health care provider. This may involve avoiding foods and drinks such as: Coffee and tea (with or without caffeine). Drinks that containalcohol. Energy drinks and sports drinks. Carbonated drinks or sodas. Chocolate and cocoa. Peppermint and mint flavorings. Garlic and onions. Horseradish. Spicy and acidic foods, including peppers, chili powder, curry powder, vinegar, hot sauces, and barbecue sauce. Citrus fruit juices and citrus fruits, such as oranges, lemons, and limes. Tomato-based foods, such as red sauce, chili, salsa, and pizza with red sauce. Fried and fatty foods, such as donuts,  french fries, potato chips, and high-fat dressings. High-fat meats, such as hot dogs and fatty cuts of red and white meats, such as rib eye steak, sausage, ham, and bacon. High-fat dairy items, such as whole milk, butter, and cream cheese. Eat small, frequent meals instead of large meals. Avoid drinking large amounts of liquid with your meals. Avoid eating meals during the 2-3 hours before bedtime. Avoid lying down right after you eat. Do not exercise right after you eat. General Instructions Pay attention to any changes in your symptoms. Take over-the-counter and prescription medicines only as told by your health care provider. Do not take aspirin, ibuprofen, or other NSAIDs unless your health care provider told you to do so. Do not use any tobacco products, including cigarettes, chewing tobacco, and e-cigarettes. If you need help quitting, ask your health care provider. Wear loose-fitting clothing. Do not wear anything tight around your waist that causes pressure on your abdomen. Raise (elevate) the head of your bed 6 inches (15cm). Try to reduce your stress, such as with yoga or meditation. If you need help reducing stress, ask your health care provider. If you are overweight, reduce your weight to an amount that is healthy for you. Ask your health care provider for guidance about a safe  weight loss goal. Keep all follow-up visits as told by your health care provider. This is important. SEEK MEDICAL CARE IF: You have new symptoms. You have unexplained weight loss. You have difficulty swallowing, or it hurts to swallow. You have wheezing or a persistent cough. Your symptoms do not improve with treatment. You have a hoarse voice. SEEK IMMEDIATE MEDICAL CARE IF: You have pain in your arms, neck, jaw, teeth, or back. You feel sweaty, dizzy, or light-headed. You have chest pain or shortness of breath. You vomit and your vomit looks like blood or coffee grounds. You faint. Your stool is  bloody or black. You cannot swallow, drink, or eat.   This information is not intended to replace advice given to you by your health care provider. Make sure you discuss any questions you have with your health care provider.   Document Released: 03/29/2005 Document Revised: 03/10/2015 Document Reviewed: 10/14/2014 Elsevier Interactive Patient Education 2016 [employer]. Hemorrhoids Hemorrhoids are swollen veins around the rectum or anus. There are two types of hemorrhoids:  Internal hemorrhoids. These occur in the veins just inside the rectum. They may poke through to the outside and become irritated and painful. External hemorrhoids. These occur in the veins outside the anus and can be felt as a painful swelling or hard lump near the anus. CAUSES Pregnancy.  Obesity.  Constipation or diarrhea.  Straining to have a bowel movement.  Sitting for long periods on the toilet. Heavy lifting or other activity that caused you to strain. Anal intercourse. SYMPTOMS  Pain.  Anal itching or irritation.  Rectal bleeding.  Fecal leakage.  Anal swelling.  One or more lumps around the anus.  DIAGNOSIS  Your caregiver may be able to diagnose hemorrhoids by visual examination. Other examinations or tests that may be performed include:  Examination of the rectal area with a gloved hand (digital rectal exam).  Examination of anal canal using a small tube (scope).  A blood test if you have lost a significant amount of blood. A test to look inside the colon (sigmoidoscopy or colonoscopy). TREATMENT Most hemorrhoids can be treated at home. However, if symptoms do not seem to be getting better or if you have a lot of rectal bleeding, your caregiver may perform a procedure to help make the hemorrhoids get smaller or remove them completely. Possible treatments include:  Placing a rubber band at the base of the hemorrhoid to cut off the circulation (rubber band ligation).  Injecting a chemical  to shrink the hemorrhoid (sclerotherapy).  Using a tool to burn the hemorrhoid (infrared light therapy).  Surgically removing the hemorrhoid (hemorrhoidectomy).  Stapling the hemorrhoid to block blood flow to the tissue (hemorrhoid stapling).  HOME CARE INSTRUCTIONS  Eat foods with fiber, such as whole grains, beans, nuts, fruits, and vegetables. Ask your doctor about taking products with added fiber in them (fibersupplements). Increase fluid intake. Drink enough water and fluids to keep your urine clear or pale yellow.  Exercise regularly.  Go to the bathroom when you have the urge to have a bowel movement. Do not wait.  Avoid straining to have bowel movements.  Keep the anal area dry and clean. Use wet toilet paper or moist towelettes after a bowel movement.  Medicated creams and suppositories may be used or applied as directed.  Only take over-the-counter or prescription medicines as directed by your caregiver.  Take warm sitz baths for 15-20 minutes, 3-4 times a day to ease pain and discomfort.  Place ice packs  on the hemorrhoids if they are tender and swollen. Using ice packs between sitz baths may be helpful.  Put ice in a plastic bag.  Place a towel between your skin and the bag.  Leave the ice on for 15-20 minutes, 3-4 times a day.  Do not use a donut-shaped pillow or sit on the toilet for long periods. This increases blood pooling and pain.  SEEK MEDICAL CARE IF: You have increasing pain and swelling that is not controlled by treatment or medicine. You have uncontrolled bleeding. You have difficulty or you are unable to have a bowel movement. You have pain or inflammation outside the area of the hemorrhoids. MAKE SURE YOU: Understand these instructions. Will watch your condition. Will get help right away if you are not doing well or get worse.   This information is not intended to replace advice given to you by your health care provider. Make sure you discuss  any questions you have with your health care provider.   Document Released: 06/16/2000 Document Revised: 06/05/2012 Document Reviewed: 04/23/2012 Elsevier Interactive Patient Education [employer].

## 2015-11-22 NOTE — H&P (View-Only) (Signed)
Referring Provider: Marjean Donna, MD Primary Care Physician:  Jani Gravel, MD  Primary GI: Dr. Gala Romney   Chief Complaint  Patient presents with  . Diarrhea  . Abdominal Pain    HPI:   Susan Baker is a 55 y.o. female presenting today with a history of abdominal pain, vomiting, and diarrhea. Last seen Sept 2015.  Extensive evaluation in 2012 with ultrasound, HIDA, MRI. She did note mild abdominal pain with CCK infusion during HIDA but EF was 82%, which is normal. EGD on file with mild distal erosive reflux esophagitis, antral erosions, negative H.pylori (May 2012). No prior colonoscopy.   Has been out of Protonix for a week as she was unable to pick it up. Woke up around midnight Friday night with stomach aches, nausea, vomiting. Wasn't sure if vomiting was because of abdominal pain or not. Rectal bleeding started the next morning but felt like she was having mostly just blood, not stool. Happened about 3-4 times, then had more diarrhea. Diarrhea improved and now more soft. Abdominal pain somewhat improved. Has chronic RUQ pain. Worse when she is out of a PPI. Has intermittent right-sided chest discomfort with stress. Seen in ED 08/18/15 with chest pain. Occasional solid food dysphagia. Notices it with bread, chicken.    Past Medical History  Diagnosis Date  . Fibromyalgia   . Uterine cancer ([place])   . PUD (peptic ulcer disease)     bleeding PUD per patient, remote, On Daypro  . GERD (gastroesophageal reflux disease)     egd with RE per patient, remote  . Bulging disc     cervical, [facility]  . Osteoarthritis (arthritis due to wear and tear of joints)   . Depression 02/18/2013  . Abnormal Pap smear   . Vaginal Pap smear, abnormal   . History of abnormal cervical Pap smear 02/24/2014  . Postmenopausal 02/24/2014  . Osteoporosis, unspecified 03/03/2014  . Anxiety   . Urinary frequency 05/14/2014  . Hematuria 05/14/2014  . Osteoporosis   . Postcoital bleeding 09/15/2015    Past  Surgical History  Procedure Laterality Date  . Hysterectomy for uterine cancer      partial  . Ectopic pregnancy surgery      twice  . Esophagogastroduodenoscopy  12/01/2010    Dr. Gala Romney: mild distal ERE, antral erosions due to NSAIDS, no H.Pylori    Current Outpatient Prescriptions  Medication Sig Dispense Refill  . FLUoxetine (PROZAC) 40 MG capsule Take 40 mg by mouth 2 (two) times daily.     Marland Kitchen ibuprofen (ADVIL,MOTRIN) 200 MG tablet Take 200 mg by mouth as needed.     . Oxycodone HCl 10 MG TABS Take 10 mg by mouth 4 (four) times daily.  0  . pantoprazole (PROTONIX) 40 MG tablet TAKE 1 TABLET (40 MG TOTAL) BY MOUTH DAILY. 30 tablet 10  . pregabalin (LYRICA) 75 MG capsule Take 75 mg by mouth 3 (three) times daily.    . QUEtiapine (SEROQUEL) 25 MG tablet 25 mg as needed.   3  . zolpidem (AMBIEN CR) 12.5 MG CR tablet 12.5 mg at bedtime as needed.     Marland Kitchen dexlansoprazole (DEXILANT) 60 MG capsule Take 1 capsule (60 mg total) by mouth daily. 90 capsule 3   No current facility-administered medications for this visit.    Allergies as of 11/02/2015 - Review Complete 11/02/2015  Allergen Reaction Noted  . Cymbalta [duloxetine hcl] Other (See Comments) 02/22/2012  . Daypro [oxaprozin] Other (See Comments) 11/07/2010  .  Trazodone and nefazodone Other (See Comments) 02/22/2012  . Nucynta [tapentadol] Other (See Comments) 08/18/2015    Family History  Problem Relation Age of Onset  . Cirrhosis Father     etoh, died with liver cancer  . Diabetes Father   . Cirrhosis Paternal Uncle     multiple, etoh  . Colon cancer Maternal Uncle   . Pancreatic cancer Paternal Grandmother   . Diabetes Paternal Grandmother   . Kidney failure Mother   . Diabetes Mother   . Other Brother     crohn's disease  . Hyperlipidemia Brother   . Fibromyalgia Daughter   . Diabetes Maternal Grandmother     Social History   Social History  . Marital Status: Divorced    Spouse Name: N/A  . Number of Children:  1  . Years of Education: N/A   Occupational History  . disability    Social History Main Topics  . Smoking status: Current Some Day Smoker -- 0.50 packs/day for 30 years    Types: Cigarettes  . Smokeless tobacco: Never Used  . Alcohol Use: No     Comment: occasional social use   . Drug Use: No  . Sexual Activity: Yes    Birth Control/ Protection: Surgical     Comment: hysterectomy   Other Topics Concern  . None   Social History Narrative    Review of Systems: As mentioned in HPI.   Physical Exam: BP 116/78 mmHg  Pulse 74  Temp(Src) 98.4 F (36.9 C) (Oral)  Ht 5\' 3"  (1.6 m)  Wt 137 lb 6.4 oz (62.324 kg)  BMI 24.35 kg/m2 General:   Alert and oriented. No distress noted. Pleasant and cooperative.  Head:  Normocephalic and atraumatic. Eyes:  Conjuctiva clear without scleral icterus. Mouth:  Oral mucosa pink and moist. Good dentition. No lesions. Neck:  Left posterior neck lymph node prominence, left supraclavicular area more pronounced than right  Heart:  S1, S2 present without murmurs, rubs, or gallops. Regular rate and rhythm. Abdomen:  +BS, soft, non-tender and non-distended. No rebound or guarding. No HSM or masses noted. Msk:  Symmetrical without gross deformities. Normal posture. Extremities:  Without edema. Neurologic:  Alert and  oriented x4;  grossly normal neurologically. Psych:  Alert and cooperative. Normal mood and affect.

## 2015-11-22 NOTE — Op Note (Signed)
[facility] Patient Name: Susan Baker Procedure Date: 11/22/2015 7:37 AM MRN: [id number] Date of Birth: 06-15-1961 Attending MD: Norvel Richards , MD CSN: [id number] Age: 55 Admit Type: Outpatient Procedure:                Upper GI endoscopy with [facility] dilation Indications:              esophageal dysphagia; reflux; right upper quadrant                            abdominal pain, Dysphagia Providers:                Norvel Richards, MD, Lurline Del, RN, Isabella Stalling, Technician Referring MD:             Teodora Medici. Kim Medicines:                Monitored Anesthesia Care Complications:            No immediate complications. Estimated Blood Loss:     Estimated blood loss: none. Procedure:                Pre-Anesthesia Assessment:                           - Prior to the procedure, a History and Physical                            was performed, and patient medications and                            allergies were reviewed. The patient's tolerance of                            previous anesthesia was also reviewed. The risks                            and benefits of the procedure and the sedation                            options and risks were discussed with the patient.                            All questions were answered, and informed consent                            was obtained. Prior Anticoagulants: The patient has                            taken no previous anticoagulant or antiplatelet                            agents. ASA Grade Assessment: II - A patient with  mild systemic disease. After reviewing the risks                            and benefits, the patient was deemed in                            satisfactory condition to undergo the procedure.                           After obtaining informed consent, the endoscope was                            passed under direct vision. Throughout the                  procedure, the patient's blood pressure, pulse, and                            oxygen saturations were monitored continuously. The                            [id number]) scope was introduced through the                            mouth, and advanced to the second part of duodenum.                            The upper GI endoscopy was accomplished without                            difficulty. The patient tolerated the procedure                            well. Scope In: 8:11:40 AM Scope Out: 8:16:43 AM Total Procedure Duration: 0 hours 5 minutes 3 seconds  Findings:      LA Grade A (one or more mucosal breaks less than 5 mm, not extending       between tops of 2 mucosal folds) esophagitis was found 35 to 36 cm from       the incisors. no Barrett's epithelium. Esophagus was patent about its       course.      A small hiatal hernia was present.gastric mucosa otherwise appeared       normal. Pylorus patent.      The second portion of the duodenum was normal. The scope was withdrawn.       Dilation was performed with a Maloney dilator with no resistance at 30       Fr. The dilation site was examined following endoscope reinsertion and       showed no change. Estimated blood loss: none. Impression:               - LA Grade A esophagitis. Dilated.                           - Small hiatal hernia. Normal stomach otherwise.                           -  Normal second portion of the duodenum.                           - No specimens collected. Moderate Sedation:      Moderate (conscious) sedation was personally administered by an       anesthesia professional. The following parameters were monitored: oxygen       saturation, heart rate, blood pressure, respiratory rate, EKG, adequacy       of pulmonary ventilation, and response to care. Total physician       intraservice time was 14 minutes. Recommendation:           - Patient has a contact number available for                             emergencies. The signs and symptoms of potential                            delayed complications were discussed with the                            patient. Return to normal activities tomorrow.                            Written discharge instructions were provided to the                            patient.                           - Advance diet as tolerated.                           - Continue present medications.                           - Await pathology results.                           - Repeat upper endoscopy [Day] [Reason]. Procedure Code(s):        --- Professional ---                           [phone], Esophagogastroduodenoscopy, flexible,                            transoral; diagnostic, including collection of                            specimen(s) by brushing or washing, when performed                            (separate procedure)                           [id number], Dilation of esophagus, by unguided sound or  bougie, single or multiple passes Diagnosis Code(s):        --- Professional ---                           K20.9, Esophagitis, unspecified                           K44.9, Diaphragmatic hernia without obstruction or                            gangrene CPT copyright 2016 American Medical Association. All rights reserved. The codes documented in this report are preliminary and upon coder review may  be revised to meet current compliance requirements. [name] Trynity Skousen, MD Norvel Richards, MD 11/22/2015 8:37:05 AM This report has been signed electronically. Number of Addenda: 0

## 2015-11-22 NOTE — Interval H&P Note (Signed)
History and Physical Interval Note:  11/22/2015 7:27 AM  Susan Baker  has presented today for surgery, with the diagnosis of RECTAL BLEEDING, ABDOMINAL PAIN, DYSPHAGIA  The various methods of treatment have been discussed with the patient and family. After consideration of risks, benefits and other options for treatment, the patient has consented to  Procedure(s) with comments: COLONOSCOPY WITH PROPOFOL (N/A) - 0815 ESOPHAGOGASTRODUODENOSCOPY (EGD) WITH PROPOFOL (N/A) ESOPHAGEAL DILATION (N/A) as a surgical intervention .  The patient's history has been reviewed, patient examined, no change in status, stable for surgery.  I have reviewed the patient's chart and labs.  Questions were answered to the patient's satisfaction.     Debar Plate  No change; EGD/ED and TCS per plan.  The risks, benefits, limitations, imponderables and alternatives regarding both EGD and colonoscopy have been reviewed with the patient. Questions have been answered. All parties agreeable.

## 2015-11-22 NOTE — Progress Notes (Signed)
Quick Note:  CT soft tissue neck completed after US neck with abnormality: this shows asymmetric fat distribution, no mass. ______

## 2015-11-22 NOTE — Anesthesia Preprocedure Evaluation (Signed)
Anesthesia Evaluation  Patient identified by MRN, date of birth, ID band Patient awake    Reviewed: Allergy & Precautions, NPO status , Patient's Chart, lab work & pertinent test results  Airway Mallampati: II  TM Distance: >3 FB     Dental  (+) Teeth Intact   Pulmonary Current Smoker,    breath sounds clear to auscultation       Cardiovascular negative cardio ROS   Rhythm:Regular Rate:Normal     Neuro/Psych PSYCHIATRIC DISORDERS Anxiety Depression Cervical disc     GI/Hepatic PUD, GERD  ,  Endo/Other    Renal/GU      Musculoskeletal  (+) Fibromyalgia -  Abdominal   Peds  Hematology   Anesthesia Other Findings   Reproductive/Obstetrics                             Anesthesia Physical Anesthesia Plan  ASA: II  Anesthesia Plan: MAC   Post-op Pain Management:    Induction: Intravenous  Airway Management Planned: Simple Face Mask  Additional Equipment:   Intra-op Plan:   Post-operative Plan:   Informed Consent: I have reviewed the patients History and Physical, chart, labs and discussed the procedure including the risks, benefits and alternatives for the proposed anesthesia with the patient or authorized representative who has indicated his/her understanding and acceptance.     Plan Discussed with:   Anesthesia Plan Comments:         Anesthesia Quick Evaluation

## 2015-11-22 NOTE — Anesthesia Postprocedure Evaluation (Signed)
Anesthesia Post Note  Patient: CHAMIQUE MATUSEK  Procedure(s) Performed: Procedure(s) (LRB): COLONOSCOPY WITH PROPOFOL (N/A) ESOPHAGOGASTRODUODENOSCOPY (EGD) WITH PROPOFOL (N/A) ESOPHAGEAL DILATION (N/A)  Patient location during evaluation: PACU Anesthesia Type: MAC Level of consciousness: awake and patient cooperative Pain management: pain level controlled Vital Signs Assessment: post-procedure vital signs reviewed and stable Respiratory status: spontaneous breathing, nonlabored ventilation and respiratory function stable Cardiovascular status: blood pressure returned to baseline Postop Assessment: no signs of nausea or vomiting Anesthetic complications: no    Last Vitals:  Filed Vitals:   11/22/15 0755 11/22/15 0800  BP: 121/72   Pulse:    Resp: 20 19    Last Pain:  Filed Vitals:   11/22/15 0838  PainSc: 3                  Moniqua Engebretsen J

## 2015-11-24 ENCOUNTER — Encounter (HOSPITAL_COMMUNITY): Payer: Self-pay | Admitting: Internal Medicine

## 2015-12-13 DIAGNOSIS — M546 Pain in thoracic spine: Secondary | ICD-10-CM | POA: Diagnosis not present

## 2015-12-13 DIAGNOSIS — M5414 Radiculopathy, thoracic region: Secondary | ICD-10-CM | POA: Diagnosis not present

## 2015-12-24 ENCOUNTER — Ambulatory Visit: Payer: Medicare Other | Admitting: Internal Medicine

## 2015-12-27 DIAGNOSIS — M461 Sacroiliitis, not elsewhere classified: Secondary | ICD-10-CM | POA: Diagnosis not present

## 2015-12-27 DIAGNOSIS — M5414 Radiculopathy, thoracic region: Secondary | ICD-10-CM | POA: Diagnosis not present

## 2015-12-27 DIAGNOSIS — M5432 Sciatica, left side: Secondary | ICD-10-CM | POA: Diagnosis not present

## 2015-12-27 DIAGNOSIS — M542 Cervicalgia: Secondary | ICD-10-CM | POA: Diagnosis not present

## 2015-12-27 DIAGNOSIS — M5431 Sciatica, right side: Secondary | ICD-10-CM | POA: Diagnosis not present

## 2015-12-27 DIAGNOSIS — M797 Fibromyalgia: Secondary | ICD-10-CM | POA: Diagnosis not present

## 2015-12-27 DIAGNOSIS — M5412 Radiculopathy, cervical region: Secondary | ICD-10-CM | POA: Diagnosis not present

## 2015-12-27 DIAGNOSIS — M545 Low back pain: Secondary | ICD-10-CM | POA: Diagnosis not present

## 2016-02-15 ENCOUNTER — Ambulatory Visit: Payer: Medicare Other | Admitting: Internal Medicine

## 2016-02-15 ENCOUNTER — Telehealth: Payer: Self-pay | Admitting: Internal Medicine

## 2016-02-15 ENCOUNTER — Encounter: Payer: Self-pay | Admitting: Internal Medicine

## 2016-02-15 NOTE — Telephone Encounter (Signed)
PT WAS A NO SHOW AND LETTER SENT  °

## 2016-02-21 DIAGNOSIS — M797 Fibromyalgia: Secondary | ICD-10-CM | POA: Diagnosis not present

## 2016-02-21 DIAGNOSIS — M5431 Sciatica, right side: Secondary | ICD-10-CM | POA: Diagnosis not present

## 2016-02-21 DIAGNOSIS — M542 Cervicalgia: Secondary | ICD-10-CM | POA: Diagnosis not present

## 2016-02-21 DIAGNOSIS — M546 Pain in thoracic spine: Secondary | ICD-10-CM | POA: Diagnosis not present

## 2016-02-21 DIAGNOSIS — M5412 Radiculopathy, cervical region: Secondary | ICD-10-CM | POA: Diagnosis not present

## 2016-02-21 DIAGNOSIS — M545 Low back pain: Secondary | ICD-10-CM | POA: Diagnosis not present

## 2016-02-21 DIAGNOSIS — M5432 Sciatica, left side: Secondary | ICD-10-CM | POA: Diagnosis not present

## 2016-02-21 DIAGNOSIS — M5414 Radiculopathy, thoracic region: Secondary | ICD-10-CM | POA: Diagnosis not present

## 2016-03-13 ENCOUNTER — Other Ambulatory Visit (HOSPITAL_COMMUNITY)
Admission: RE | Admit: 2016-03-13 | Discharge: 2016-03-13 | Disposition: A | Discharge status: Home / Self Care | Payer: Medicare Other | Source: Ambulatory Visit | Attending: Adult Health | Admitting: Adult Health

## 2016-03-13 ENCOUNTER — Encounter: Payer: Self-pay | Admitting: Adult Health

## 2016-03-13 ENCOUNTER — Ambulatory Visit (INDEPENDENT_AMBULATORY_CARE_PROVIDER_SITE_OTHER): Payer: Medicare Other | Admitting: Adult Health

## 2016-03-13 VITALS — BP 120/84 | HR 92 | Ht 62.0 in | Wt 142.5 lb

## 2016-03-13 DIAGNOSIS — Z08 Encounter for follow-up examination after completed treatment for malignant neoplasm: Secondary | ICD-10-CM | POA: Diagnosis not present

## 2016-03-13 DIAGNOSIS — Z01419 Encounter for gynecological examination (general) (routine) without abnormal findings: Secondary | ICD-10-CM

## 2016-03-13 DIAGNOSIS — Z1151 Encounter for screening for human papillomavirus (HPV): Secondary | ICD-10-CM | POA: Diagnosis not present

## 2016-03-13 DIAGNOSIS — Z1272 Encounter for screening for malignant neoplasm of vagina: Secondary | ICD-10-CM

## 2016-03-13 DIAGNOSIS — Z139 Encounter for screening, unspecified: Secondary | ICD-10-CM | POA: Diagnosis not present

## 2016-03-13 DIAGNOSIS — R87629 Unspecified abnormal cytological findings in specimens from vagina: Secondary | ICD-10-CM

## 2016-03-13 DIAGNOSIS — Z90710 Acquired absence of both cervix and uterus: Secondary | ICD-10-CM | POA: Diagnosis not present

## 2016-03-13 DIAGNOSIS — Z86001 Personal history of in-situ neoplasm of cervix uteri: Secondary | ICD-10-CM | POA: Diagnosis not present

## 2016-03-13 DIAGNOSIS — Z01411 Encounter for gynecological examination (general) (routine) with abnormal findings: Secondary | ICD-10-CM | POA: Insufficient documentation

## 2016-03-13 DIAGNOSIS — Z1212 Encounter for screening for malignant neoplasm of rectum: Secondary | ICD-10-CM | POA: Diagnosis not present

## 2016-03-13 DIAGNOSIS — Z8742 Personal history of other diseases of the female genital tract: Secondary | ICD-10-CM

## 2016-03-13 LAB — HEMOCCULT GUIAC POC 1CARD (OFFICE): Fecal Occult Blood, POC: NEGATIVE

## 2016-03-13 NOTE — Patient Instructions (Signed)
Pap and physical in 1 year Mammogram yearly  

## 2016-03-13 NOTE — Progress Notes (Signed)
Patient ID: Susan Baker, female   DOB: [birth year], 55 y.o.   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 PCP is [facility].  Current Medications, Allergies, Past Medical History, Past Surgical History, Family History and Social History were reviewed in [employer] record.     Review of Systems: Patient denies any headaches, hearing loss, fatigue, blurred vision, shortness of breath, chest pain, abdominal pain, problems with bowel movements, urination, or intercourse(not having sex). No joint pain or mood swings. Has chronic pain esp back and sees pain doctor.   Physical Exam:BP 120/84 (BP Location: Left Arm, Patient Position: Sitting, Cuff Size: Normal)   Pulse 92   Ht 5\' 2"  (1.575 m)   Wt 142 lb 8 oz (64.6 kg)   BMI 26.06 kg/m  General:  Well developed, well nourished, no acute distress Skin:  Warm and dry,tan Neck:  Midline trachea, normal thyroid, good ROM, no lymphadenopathy Lungs; Clear to auscultation bilaterally Breast:  No dominant palpable mass, retraction, or nipple discharge Cardiovascular: Regular rate and rhythm Abdomen:  Soft, non tender, no hepatosplenomegaly Pelvic:  External genitalia is normal in appearance, no lesions.  The vagina is normal in appearance. Urethra has no lesions or masses. The cervix and uterus are absent, and pap performed with HPV.   No adnexal masses or tenderness noted.Bladder is non tender, no masses felt. Rectal: Good sphincter tone, no polyps, or hemorrhoids felt.  Hemoccult negative. Extremities/musculoskeletal:  No swelling or varicosities noted, no clubbing or cyanosis Psych:  No mood changes, alert and cooperative,seems happy   Impression: 1. Well woman exam with routine gynecological exam   2. History of carcinoma in situ of cervix   3. Abnormal  Pap smear of vagina   4. History of abnormal cervical Pap smear   5. Screening       Plan: Pap with HPV sent Check CBC,CMP and Hept C Mammogram yearly Colonoscopy per GI Try nicotine gum to help with stopping smoking

## 2016-03-14 ENCOUNTER — Telehealth: Payer: Self-pay | Admitting: Adult Health

## 2016-03-14 LAB — COMPREHENSIVE METABOLIC PANEL
ALT: 19 IU/L (ref 0–32)
AST: 22 IU/L (ref 0–40)
Albumin/Globulin Ratio: 2.2 (ref 1.2–2.2)
Albumin: 4.9 g/dL (ref 3.5–5.5)
Alkaline Phosphatase: 78 IU/L (ref 39–117)
BILIRUBIN TOTAL: 0.3 mg/dL (ref 0.0–1.2)
BUN/Creatinine Ratio: 13 (ref 9–23)
BUN: 11 mg/dL (ref 6–24)
CALCIUM: 10 mg/dL (ref 8.7–10.2)
CHLORIDE: 102 mmol/L (ref 96–106)
CO2: 28 mmol/L (ref 18–29)
Creatinine, Ser: 0.84 mg/dL (ref 0.57–1.00)
GFR calc Af Amer: 91 mL/min/{1.73_m2} (ref >59)
GFR, EST NON AFRICAN AMERICAN: 79 mL/min/{1.73_m2} (ref >59)
Globulin, Total: 2.2 g/dL (ref 1.5–4.5)
Glucose: 87 mg/dL (ref 65–99)
POTASSIUM: 4.6 mmol/L (ref 3.5–5.2)
SODIUM: 144 mmol/L (ref 134–144)
Total Protein: 7.1 g/dL (ref 6.0–8.5)

## 2016-03-14 LAB — CBC
Hematocrit: 43.7 % (ref 34.0–46.6)
Hemoglobin: 14.7 g/dL (ref 11.1–15.9)
MCH: 31.5 pg (ref 26.6–33.0)
MCHC: 33.6 g/dL (ref 31.5–35.7)
MCV: 94 fL (ref 79–97)
PLATELETS: 236 10*3/uL (ref 150–379)
RBC: 4.66 x10E6/uL (ref 3.77–5.28)
RDW: 13.7 % (ref 12.3–15.4)
WBC: 7.5 10*3/uL (ref 3.4–10.8)

## 2016-03-14 LAB — HEPATITIS C ANTIBODY

## 2016-03-14 NOTE — Telephone Encounter (Signed)
Pt aware labs normal  

## 2016-03-15 LAB — CYTOLOGY - PAP

## 2016-03-17 ENCOUNTER — Telehealth: Payer: Self-pay | Admitting: Adult Health

## 2016-03-17 NOTE — Telephone Encounter (Signed)
Pt aware pap abnormal will get colpo

## 2016-03-29 ENCOUNTER — Encounter: Payer: Self-pay | Admitting: Obstetrics and Gynecology

## 2016-03-29 ENCOUNTER — Ambulatory Visit (INDEPENDENT_AMBULATORY_CARE_PROVIDER_SITE_OTHER): Payer: Medicare Other | Admitting: Obstetrics and Gynecology

## 2016-03-29 VITALS — BP 112/72 | HR 100 | Ht 62.0 in | Wt 142.6 lb

## 2016-03-29 DIAGNOSIS — N941 Unspecified dyspareunia: Secondary | ICD-10-CM

## 2016-03-29 DIAGNOSIS — N952 Postmenopausal atrophic vaginitis: Secondary | ICD-10-CM

## 2016-03-29 DIAGNOSIS — D069 Carcinoma in situ of cervix, unspecified: Secondary | ICD-10-CM

## 2016-03-29 DIAGNOSIS — R87622 Low grade squamous intraepithelial lesion on cytologic smear of vagina (LGSIL): Secondary | ICD-10-CM | POA: Diagnosis not present

## 2016-03-29 DIAGNOSIS — N930 Postcoital and contact bleeding: Secondary | ICD-10-CM

## 2016-03-29 DIAGNOSIS — R87629 Unspecified abnormal cytological findings in specimens from vagina: Secondary | ICD-10-CM

## 2016-03-29 DIAGNOSIS — N890 Mild vaginal dysplasia: Secondary | ICD-10-CM

## 2016-03-29 MED ORDER — ESTROGENS, CONJUGATED 0.625 MG/GM VA CREA: 0.5000 g | TOPICAL_CREAM | VAGINAL | 12 refills | Status: DC

## 2016-03-29 NOTE — Progress Notes (Addendum)
Patient ID: Susan Baker, female   DOB: 1961/06/23, 55 y.o.   MRN: [id number]  Chief Complaint  Patient presents with  . Colposcopy    consent signed     Susan Baker 55 y.o. 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   Discussed role for HPV in cervical dysplasia, need for surveillance.my perspective is that risk of Cancer is very low since entire cervix is removed.  Patient given informed consent, signed copy in the chart, time out was performed.  Placed in lithotomy position. Surgical cuff viewed with speculum and colposcope after application of acetic acid.   Physical exam  Well healed surgical cuff, no atypical vessels. No thickened epithelium. Cervix appears completely removed. Bimanual exam reveals thickened cuff and tenderness to the right consistent with ovarian enlargement. Vaginal tissues atrophic.   Bedside transvaginal US Confirmed with [place] at no charge that there are no cystic masses. Pt was initially tender on exam, but [place] was done to confirm and no abnormalities or tenderness was found.   HPI  Pt notes that she frequently experiences vaginal bleeding post-coital with her long-term partner.   Colposcopy not obtained- cervix surgically absent   Assessment  1. 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 Electronically Signed: Hansel Feinstein, ED Scribe. 03/29/16. 3:07 PM.  I personally performed the services described in this documentation, which was SCRIBED in my presence. The recorded information has been reviewed and considered accurate. It has been edited as  necessary during review. Jonnie Kind, MD

## 2016-04-13 DIAGNOSIS — M9902 Segmental and somatic dysfunction of thoracic region: Secondary | ICD-10-CM | POA: Diagnosis not present

## 2016-04-13 DIAGNOSIS — M542 Cervicalgia: Secondary | ICD-10-CM | POA: Diagnosis not present

## 2016-04-13 DIAGNOSIS — M9901 Segmental and somatic dysfunction of cervical region: Secondary | ICD-10-CM | POA: Diagnosis not present

## 2016-04-13 DIAGNOSIS — M545 Low back pain: Secondary | ICD-10-CM | POA: Diagnosis not present

## 2016-04-13 DIAGNOSIS — M546 Pain in thoracic spine: Secondary | ICD-10-CM | POA: Diagnosis not present

## 2016-04-13 DIAGNOSIS — M9903 Segmental and somatic dysfunction of lumbar region: Secondary | ICD-10-CM | POA: Diagnosis not present

## 2016-04-14 DIAGNOSIS — M542 Cervicalgia: Secondary | ICD-10-CM | POA: Diagnosis not present

## 2016-04-14 DIAGNOSIS — M545 Low back pain: Secondary | ICD-10-CM | POA: Diagnosis not present

## 2016-04-14 DIAGNOSIS — M9901 Segmental and somatic dysfunction of cervical region: Secondary | ICD-10-CM | POA: Diagnosis not present

## 2016-04-14 DIAGNOSIS — M9903 Segmental and somatic dysfunction of lumbar region: Secondary | ICD-10-CM | POA: Diagnosis not present

## 2016-04-14 DIAGNOSIS — M546 Pain in thoracic spine: Secondary | ICD-10-CM | POA: Diagnosis not present

## 2016-04-14 DIAGNOSIS — M9902 Segmental and somatic dysfunction of thoracic region: Secondary | ICD-10-CM | POA: Diagnosis not present

## 2016-04-17 DIAGNOSIS — M9902 Segmental and somatic dysfunction of thoracic region: Secondary | ICD-10-CM | POA: Diagnosis not present

## 2016-04-17 DIAGNOSIS — M545 Low back pain: Secondary | ICD-10-CM | POA: Diagnosis not present

## 2016-04-17 DIAGNOSIS — M542 Cervicalgia: Secondary | ICD-10-CM | POA: Diagnosis not present

## 2016-04-17 DIAGNOSIS — M9903 Segmental and somatic dysfunction of lumbar region: Secondary | ICD-10-CM | POA: Diagnosis not present

## 2016-04-17 DIAGNOSIS — M546 Pain in thoracic spine: Secondary | ICD-10-CM | POA: Diagnosis not present

## 2016-04-17 DIAGNOSIS — M9901 Segmental and somatic dysfunction of cervical region: Secondary | ICD-10-CM | POA: Diagnosis not present

## 2016-04-19 DIAGNOSIS — M545 Low back pain: Secondary | ICD-10-CM | POA: Diagnosis not present

## 2016-04-19 DIAGNOSIS — M9901 Segmental and somatic dysfunction of cervical region: Secondary | ICD-10-CM | POA: Diagnosis not present

## 2016-04-19 DIAGNOSIS — M9902 Segmental and somatic dysfunction of thoracic region: Secondary | ICD-10-CM | POA: Diagnosis not present

## 2016-04-19 DIAGNOSIS — M546 Pain in thoracic spine: Secondary | ICD-10-CM | POA: Diagnosis not present

## 2016-04-19 DIAGNOSIS — M542 Cervicalgia: Secondary | ICD-10-CM | POA: Diagnosis not present

## 2016-04-19 DIAGNOSIS — M9903 Segmental and somatic dysfunction of lumbar region: Secondary | ICD-10-CM | POA: Diagnosis not present

## 2016-04-21 DIAGNOSIS — M546 Pain in thoracic spine: Secondary | ICD-10-CM | POA: Diagnosis not present

## 2016-04-21 DIAGNOSIS — M545 Low back pain: Secondary | ICD-10-CM | POA: Diagnosis not present

## 2016-04-21 DIAGNOSIS — M9903 Segmental and somatic dysfunction of lumbar region: Secondary | ICD-10-CM | POA: Diagnosis not present

## 2016-04-21 DIAGNOSIS — M542 Cervicalgia: Secondary | ICD-10-CM | POA: Diagnosis not present

## 2016-04-21 DIAGNOSIS — M9902 Segmental and somatic dysfunction of thoracic region: Secondary | ICD-10-CM | POA: Diagnosis not present

## 2016-04-21 DIAGNOSIS — M9901 Segmental and somatic dysfunction of cervical region: Secondary | ICD-10-CM | POA: Diagnosis not present

## 2016-04-25 DIAGNOSIS — M546 Pain in thoracic spine: Secondary | ICD-10-CM | POA: Diagnosis not present

## 2016-04-25 DIAGNOSIS — M545 Low back pain: Secondary | ICD-10-CM | POA: Diagnosis not present

## 2016-04-25 DIAGNOSIS — M5412 Radiculopathy, cervical region: Secondary | ICD-10-CM | POA: Diagnosis not present

## 2016-04-25 DIAGNOSIS — M797 Fibromyalgia: Secondary | ICD-10-CM | POA: Diagnosis not present

## 2016-04-25 DIAGNOSIS — M542 Cervicalgia: Secondary | ICD-10-CM | POA: Diagnosis not present

## 2016-06-28 DIAGNOSIS — M797 Fibromyalgia: Secondary | ICD-10-CM | POA: Diagnosis not present

## 2016-06-28 DIAGNOSIS — M546 Pain in thoracic spine: Secondary | ICD-10-CM | POA: Diagnosis not present

## 2016-06-28 DIAGNOSIS — M5412 Radiculopathy, cervical region: Secondary | ICD-10-CM | POA: Diagnosis not present

## 2016-08-03 DIAGNOSIS — L03012 Cellulitis of left finger: Secondary | ICD-10-CM | POA: Diagnosis not present

## 2016-08-03 DIAGNOSIS — M791 Myalgia: Secondary | ICD-10-CM | POA: Diagnosis not present

## 2016-08-22 DIAGNOSIS — M546 Pain in thoracic spine: Secondary | ICD-10-CM | POA: Diagnosis not present

## 2016-08-22 DIAGNOSIS — M5412 Radiculopathy, cervical region: Secondary | ICD-10-CM | POA: Diagnosis not present

## 2016-08-22 DIAGNOSIS — M545 Low back pain: Secondary | ICD-10-CM | POA: Diagnosis not present

## 2016-08-22 DIAGNOSIS — M797 Fibromyalgia: Secondary | ICD-10-CM | POA: Diagnosis not present

## 2016-08-22 DIAGNOSIS — Z79899 Other long term (current) drug therapy: Secondary | ICD-10-CM | POA: Diagnosis not present

## 2016-08-22 DIAGNOSIS — M5414 Radiculopathy, thoracic region: Secondary | ICD-10-CM | POA: Diagnosis not present

## 2016-09-06 DIAGNOSIS — M797 Fibromyalgia: Secondary | ICD-10-CM | POA: Diagnosis not present

## 2016-09-06 DIAGNOSIS — M1990 Unspecified osteoarthritis, unspecified site: Secondary | ICD-10-CM | POA: Diagnosis not present

## 2016-09-06 DIAGNOSIS — M810 Age-related osteoporosis without current pathological fracture: Secondary | ICD-10-CM | POA: Diagnosis not present

## 2016-09-06 DIAGNOSIS — Z8739 Personal history of other diseases of the musculoskeletal system and connective tissue: Secondary | ICD-10-CM | POA: Diagnosis not present

## 2016-09-28 ENCOUNTER — Other Ambulatory Visit: Payer: Self-pay | Admitting: Obstetrics and Gynecology

## 2016-09-28 DIAGNOSIS — Z1231 Encounter for screening mammogram for malignant neoplasm of breast: Secondary | ICD-10-CM

## 2016-10-02 ENCOUNTER — Ambulatory Visit (HOSPITAL_COMMUNITY): Payer: Medicare Other

## 2016-10-17 DIAGNOSIS — M542 Cervicalgia: Secondary | ICD-10-CM | POA: Diagnosis not present

## 2016-10-17 DIAGNOSIS — M5431 Sciatica, right side: Secondary | ICD-10-CM | POA: Diagnosis not present

## 2016-10-17 DIAGNOSIS — M546 Pain in thoracic spine: Secondary | ICD-10-CM | POA: Diagnosis not present

## 2016-10-17 DIAGNOSIS — M5414 Radiculopathy, thoracic region: Secondary | ICD-10-CM | POA: Diagnosis not present

## 2016-10-17 DIAGNOSIS — M545 Low back pain: Secondary | ICD-10-CM | POA: Diagnosis not present

## 2016-10-17 DIAGNOSIS — M5412 Radiculopathy, cervical region: Secondary | ICD-10-CM | POA: Diagnosis not present

## 2016-10-17 DIAGNOSIS — M5432 Sciatica, left side: Secondary | ICD-10-CM | POA: Diagnosis not present

## 2016-10-17 DIAGNOSIS — M461 Sacroiliitis, not elsewhere classified: Secondary | ICD-10-CM | POA: Diagnosis not present

## 2016-12-19 DIAGNOSIS — M461 Sacroiliitis, not elsewhere classified: Secondary | ICD-10-CM | POA: Diagnosis not present

## 2016-12-19 DIAGNOSIS — M546 Pain in thoracic spine: Secondary | ICD-10-CM | POA: Diagnosis not present

## 2016-12-19 DIAGNOSIS — M5414 Radiculopathy, thoracic region: Secondary | ICD-10-CM | POA: Diagnosis not present

## 2016-12-19 DIAGNOSIS — M5412 Radiculopathy, cervical region: Secondary | ICD-10-CM | POA: Diagnosis not present

## 2016-12-19 DIAGNOSIS — M545 Low back pain: Secondary | ICD-10-CM | POA: Diagnosis not present

## 2016-12-19 DIAGNOSIS — M542 Cervicalgia: Secondary | ICD-10-CM | POA: Diagnosis not present

## 2016-12-19 DIAGNOSIS — M797 Fibromyalgia: Secondary | ICD-10-CM | POA: Diagnosis not present

## 2017-02-07 ENCOUNTER — Other Ambulatory Visit: Payer: Self-pay | Admitting: Adult Health

## 2017-02-07 DIAGNOSIS — Z1231 Encounter for screening mammogram for malignant neoplasm of breast: Secondary | ICD-10-CM

## 2017-02-13 DIAGNOSIS — M545 Low back pain: Secondary | ICD-10-CM | POA: Diagnosis not present

## 2017-02-13 DIAGNOSIS — M797 Fibromyalgia: Secondary | ICD-10-CM | POA: Diagnosis not present

## 2017-02-13 DIAGNOSIS — M5431 Sciatica, right side: Secondary | ICD-10-CM | POA: Diagnosis not present

## 2017-02-13 DIAGNOSIS — M5432 Sciatica, left side: Secondary | ICD-10-CM | POA: Diagnosis not present

## 2017-02-13 DIAGNOSIS — M461 Sacroiliitis, not elsewhere classified: Secondary | ICD-10-CM | POA: Diagnosis not present

## 2017-02-13 DIAGNOSIS — M542 Cervicalgia: Secondary | ICD-10-CM | POA: Diagnosis not present

## 2017-02-13 DIAGNOSIS — M546 Pain in thoracic spine: Secondary | ICD-10-CM | POA: Diagnosis not present

## 2017-02-13 DIAGNOSIS — M5414 Radiculopathy, thoracic region: Secondary | ICD-10-CM | POA: Diagnosis not present

## 2017-02-13 DIAGNOSIS — M5412 Radiculopathy, cervical region: Secondary | ICD-10-CM | POA: Diagnosis not present

## 2017-02-23 DIAGNOSIS — Z1283 Encounter for screening for malignant neoplasm of skin: Secondary | ICD-10-CM | POA: Diagnosis not present

## 2017-02-23 DIAGNOSIS — D225 Melanocytic nevi of trunk: Secondary | ICD-10-CM | POA: Diagnosis not present

## 2017-02-23 DIAGNOSIS — L814 Other melanin hyperpigmentation: Secondary | ICD-10-CM | POA: Diagnosis not present

## 2017-02-23 DIAGNOSIS — L309 Dermatitis, unspecified: Secondary | ICD-10-CM | POA: Diagnosis not present

## 2017-02-23 DIAGNOSIS — L821 Other seborrheic keratosis: Secondary | ICD-10-CM | POA: Diagnosis not present

## 2017-03-27 ENCOUNTER — Other Ambulatory Visit: Payer: Medicare Other | Admitting: Adult Health

## 2017-04-02 ENCOUNTER — Ambulatory Visit (HOSPITAL_COMMUNITY): Payer: Medicare Other

## 2017-04-11 ENCOUNTER — Other Ambulatory Visit: Payer: Medicare Other | Admitting: Adult Health

## 2017-04-18 DIAGNOSIS — M5414 Radiculopathy, thoracic region: Secondary | ICD-10-CM | POA: Diagnosis not present

## 2017-04-18 DIAGNOSIS — M546 Pain in thoracic spine: Secondary | ICD-10-CM | POA: Diagnosis not present

## 2017-04-18 DIAGNOSIS — M797 Fibromyalgia: Secondary | ICD-10-CM | POA: Diagnosis not present

## 2017-04-18 DIAGNOSIS — M545 Low back pain: Secondary | ICD-10-CM | POA: Diagnosis not present

## 2017-04-18 DIAGNOSIS — M5412 Radiculopathy, cervical region: Secondary | ICD-10-CM | POA: Diagnosis not present

## 2017-04-18 DIAGNOSIS — M542 Cervicalgia: Secondary | ICD-10-CM | POA: Diagnosis not present

## 2017-04-19 ENCOUNTER — Encounter: Payer: Self-pay | Admitting: Adult Health

## 2017-04-19 ENCOUNTER — Other Ambulatory Visit (HOSPITAL_COMMUNITY)
Admission: RE | Admit: 2017-04-19 | Discharge: 2017-04-19 | Disposition: A | Discharge status: Home / Self Care | Payer: Medicare Other | Source: Ambulatory Visit | Attending: Adult Health | Admitting: Adult Health

## 2017-04-19 ENCOUNTER — Ambulatory Visit (INDEPENDENT_AMBULATORY_CARE_PROVIDER_SITE_OTHER): Payer: Medicare Other | Admitting: Adult Health

## 2017-04-19 VITALS — BP 120/90 | HR 76 | Ht 62.5 in | Wt 147.0 lb

## 2017-04-19 DIAGNOSIS — R319 Hematuria, unspecified: Secondary | ICD-10-CM

## 2017-04-19 DIAGNOSIS — Z87898 Personal history of other specified conditions: Secondary | ICD-10-CM | POA: Diagnosis not present

## 2017-04-19 DIAGNOSIS — Z1211 Encounter for screening for malignant neoplasm of colon: Secondary | ICD-10-CM | POA: Insufficient documentation

## 2017-04-19 DIAGNOSIS — Z86001 Personal history of in-situ neoplasm of cervix uteri: Secondary | ICD-10-CM

## 2017-04-19 DIAGNOSIS — Z01419 Encounter for gynecological examination (general) (routine) without abnormal findings: Secondary | ICD-10-CM

## 2017-04-19 DIAGNOSIS — Z1212 Encounter for screening for malignant neoplasm of rectum: Secondary | ICD-10-CM | POA: Insufficient documentation

## 2017-04-19 DIAGNOSIS — R5383 Other fatigue: Secondary | ICD-10-CM

## 2017-04-19 DIAGNOSIS — Z8742 Personal history of other diseases of the female genital tract: Secondary | ICD-10-CM

## 2017-04-19 LAB — POCT URINALYSIS DIPSTICK
GLUCOSE UA: NEGATIVE
KETONES UA: NEGATIVE
LEUKOCYTES UA: NEGATIVE
Nitrite, UA: NEGATIVE
Protein, UA: NEGATIVE

## 2017-04-19 LAB — HEMOCCULT GUIAC POC 1CARD (OFFICE): Fecal Occult Blood, POC: NEGATIVE

## 2017-04-19 NOTE — Patient Instructions (Signed)
Physical in 1 year 

## 2017-04-19 NOTE — Addendum Note (Signed)
Addended by: Linton Rump on: 04/19/2017 04:58 PM   Modules accepted: Orders

## 2017-04-19 NOTE — Progress Notes (Signed)
Patient ID: Susan Baker, female   DOB: 1960/07/04, 56 y.o.   MRN: [id number] History of Present Illness:  Susan Baker is a 56 year old white female in for well woman gyn exam and pap, she is sp hysterectomy for CIS,and  last pap was LGSIL. PCP is Dr Maudie Mercury and she sees Dr Lanell Matar in [place] at pain clinic.   Current Medications, Allergies, Past Medical History, Past Surgical History, Family History and Social History were reviewed in [employer] record.     Review of Systems: Patient denies any headaches, hearing loss, blurred vision, shortness of breath, chest pain, abdominal pain, problems with bowel movements, urination, or intercourse(not having sex). No joint pain or mood swings. +fatigue, +back pain on left   Physical Exam:BP 120/90 (BP Location: Left Arm, Patient Position: Sitting, Cuff Size: Normal)   Pulse 76   Ht 5' 2.5" (1.588 m)   Wt 147 lb (66.7 kg)   BMI 26.46 kg/m urine dipstick 1+ blood General:  Well developed, well nourished, no acute distress Skin:  Warm and dry,swollen lymph node left posterior cervical chain Neck:  Midline trachea, normal thyroid, good ROM, no lymphadenopathy Lungs; Clear to auscultation bilaterally Breast:  No dominant palpable mass, retraction, or nipple discharge Cardiovascular: Regular rate and rhythm Abdomen:  Soft, non tender, no hepatosplenomegaly Pelvic:  External genitalia is normal in appearance, no lesions.  The vagina is normal in appearance. Urethra has no lesions or masses. The cervix and uterus are absent.  No adnexal masses or tenderness noted.Bladder is non tender, no masses felt. Rectal: Good sphincter tone, no polyps, or hemorrhoids felt.  Hemoccult negative. Extremities/musculoskeletal:  No swelling or varicosities noted, no clubbing or cyanosis Psych:  No mood changes, alert and cooperative,seems happy PHQ 9 score 16, is on meds, denies being suicidal.   Impression: 1. Well woman exam with routine  gynecological exam   2. History of carcinoma in situ of cervix   3. History of abnormal cervical Pap smear   4. Hematuria, unspecified type   5. Screening for colorectal cancer   6. Fatigue, unspecified type       Plan: UA C&S sent Check CBC,CMP,TSH  Pap with HPV sent Physical in 1 year Mammogram eyarly

## 2017-04-20 LAB — CBC
HEMATOCRIT: 40.2 % (ref 34.0–46.6)
Hemoglobin: 13.8 g/dL (ref 11.1–15.9)
MCH: 31.7 pg (ref 26.6–33.0)
MCHC: 34.3 g/dL (ref 31.5–35.7)
MCV: 92 fL (ref 79–97)
Platelets: 239 10*3/uL (ref 150–379)
RBC: 4.36 x10E6/uL (ref 3.77–5.28)
RDW: 13.9 % (ref 12.3–15.4)
WBC: 7.7 10*3/uL (ref 3.4–10.8)

## 2017-04-20 LAB — COMPREHENSIVE METABOLIC PANEL
A/G RATIO: 1.9 (ref 1.2–2.2)
ALT: 17 IU/L (ref 0–32)
AST: 21 IU/L (ref 0–40)
Albumin: 4.5 g/dL (ref 3.5–5.5)
Alkaline Phosphatase: 74 IU/L (ref 39–117)
BILIRUBIN TOTAL: 0.2 mg/dL (ref 0.0–1.2)
BUN/Creatinine Ratio: 15 (ref 9–23)
BUN: 11 mg/dL (ref 6–24)
CO2: 22 mmol/L (ref 20–29)
Calcium: 9.5 mg/dL (ref 8.7–10.2)
Chloride: 105 mmol/L (ref 96–106)
Creatinine, Ser: 0.75 mg/dL (ref 0.57–1.00)
GFR, EST AFRICAN AMERICAN: 104 mL/min/{1.73_m2} (ref >59)
GFR, EST NON AFRICAN AMERICAN: 90 mL/min/{1.73_m2} (ref >59)
Globulin, Total: 2.4 g/dL (ref 1.5–4.5)
Glucose: 58 mg/dL — ABNORMAL LOW (ref 65–99)
POTASSIUM: 4.3 mmol/L (ref 3.5–5.2)
Sodium: 144 mmol/L (ref 134–144)
TOTAL PROTEIN: 6.9 g/dL (ref 6.0–8.5)

## 2017-04-20 LAB — URINALYSIS, ROUTINE W REFLEX MICROSCOPIC
Bilirubin, UA: NEGATIVE
GLUCOSE, UA: NEGATIVE
Ketones, UA: NEGATIVE
Leukocytes, UA: NEGATIVE
Nitrite, UA: NEGATIVE
PROTEIN UA: NEGATIVE
RBC, UA: NEGATIVE
SPEC GRAV UA: 1.02 (ref 1.005–1.030)
Urobilinogen, Ur: 0.2 mg/dL (ref 0.2–1.0)
pH, UA: 5.5 (ref 5.0–7.5)

## 2017-04-20 LAB — TSH: TSH: 1.91 u[IU]/mL (ref 0.450–4.500)

## 2017-04-21 LAB — URINE CULTURE: Organism ID, Bacteria: NO GROWTH

## 2017-04-24 LAB — CYTOLOGY - PAP: HPV (WINDOPATH): NOT DETECTED

## 2017-04-25 ENCOUNTER — Telehealth: Payer: Self-pay | Admitting: Adult Health

## 2017-04-25 ENCOUNTER — Encounter: Payer: Self-pay | Admitting: Adult Health

## 2017-04-25 DIAGNOSIS — R896 Abnormal cytological findings in specimens from other organs, systems and tissues: Secondary | ICD-10-CM | POA: Insufficient documentation

## 2017-04-25 DIAGNOSIS — R87629 Unspecified abnormal cytological findings in specimens from vagina: Secondary | ICD-10-CM | POA: Insufficient documentation

## 2017-04-25 HISTORY — DX: Unspecified abnormal cytological findings in specimens from vagina: R87.629

## 2017-04-25 NOTE — Telephone Encounter (Signed)
Pt aware pap abnormal, LSIL to get colpo with Dr Glo Herring

## 2017-04-25 NOTE — Telephone Encounter (Signed)
no answer

## 2017-05-09 ENCOUNTER — Encounter: Payer: Self-pay | Admitting: Obstetrics and Gynecology

## 2017-05-09 ENCOUNTER — Ambulatory Visit (INDEPENDENT_AMBULATORY_CARE_PROVIDER_SITE_OTHER): Payer: Medicare Other | Admitting: Obstetrics and Gynecology

## 2017-05-09 VITALS — BP 112/70 | HR 86 | Ht 62.5 in | Wt 147.0 lb

## 2017-05-09 DIAGNOSIS — N890 Mild vaginal dysplasia: Secondary | ICD-10-CM | POA: Diagnosis not present

## 2017-05-09 DIAGNOSIS — R87629 Unspecified abnormal cytological findings in specimens from vagina: Secondary | ICD-10-CM

## 2017-05-09 NOTE — Progress Notes (Signed)
Patient ID: Susan Baker, female   DOB: [birth year], 56 y.o.   MRN: [id number]   Susan Baker 56 y.o. [id number] here for colposcopy for low-grade squamous intraepithelial neoplasia (LGSIL - encompassing HPV,mild dysplasia,CIN I) pap smear on 04/19/2017. Pt reports that she hasn't been sexually active. Pt has a hx of a vaginal hysterectomy. Discussed role for HPV in cervical dysplasia, need for surveillance.  Patient given informed consent, signed copy in the chart, time out was performed.  Placed in lithotomy position. Cervix surgically removed. Vaginal Cuff biopsy. Viewed with speculum and colposcope after application of acetic acid.   Colposcopy adequate? Yes  abnormal vessels noted at 10 o'clock; biopsies obtained at 10 o'clock.   ECC specimen not obtained. All specimens were labelled and sent to pathology.   Colposcopy IMPRESSION: 1. Hx LSIL Pap s/p Hysterectomy;  Patient was given post procedure instructions. Will follow up pathology and manage accordingly.  Routine preventative health maintenance measures emphasized.   Plan: 1. F/u 1 year for Pap,  and HPV testing Once normal x 2 on annual bais,consider low risk f/u By signing my name below, I, Margit Banda, attest that this documentation has been prepared under the direction and in the presence of Jonnie Kind, MD. Electronically Signed: Margit Banda, Medical Scribe. 05/09/17. 3:30 PM.  I personally performed the services described in this documentation, which was SCRIBED in my presence. The recorded information has been reviewed and considered accurate. It has been edited as necessary during review. Jonnie Kind, MD  Addendum: path VIN I. Will repeat pap 1 yr.

## 2017-05-21 ENCOUNTER — Telehealth: Payer: Self-pay | Admitting: *Deleted

## 2017-05-21 NOTE — Telephone Encounter (Signed)
Patient called and informed low grade (minimal) dysplasia on biopsy. Will repeat pap in 1 yr and 2 yr , routine followup after that if negative. All questions answered.

## 2017-06-20 DIAGNOSIS — M5412 Radiculopathy, cervical region: Secondary | ICD-10-CM | POA: Diagnosis not present

## 2017-06-20 DIAGNOSIS — M545 Low back pain: Secondary | ICD-10-CM | POA: Diagnosis not present

## 2017-06-20 DIAGNOSIS — M797 Fibromyalgia: Secondary | ICD-10-CM | POA: Diagnosis not present

## 2017-06-20 DIAGNOSIS — M5431 Sciatica, right side: Secondary | ICD-10-CM | POA: Diagnosis not present

## 2017-06-20 DIAGNOSIS — M546 Pain in thoracic spine: Secondary | ICD-10-CM | POA: Diagnosis not present

## 2017-06-20 DIAGNOSIS — M542 Cervicalgia: Secondary | ICD-10-CM | POA: Diagnosis not present

## 2017-06-20 DIAGNOSIS — M5414 Radiculopathy, thoracic region: Secondary | ICD-10-CM | POA: Diagnosis not present

## 2017-06-20 DIAGNOSIS — M5432 Sciatica, left side: Secondary | ICD-10-CM | POA: Diagnosis not present

## 2017-06-20 DIAGNOSIS — M461 Sacroiliitis, not elsewhere classified: Secondary | ICD-10-CM | POA: Diagnosis not present

## 2017-10-08 ENCOUNTER — Encounter: Payer: Self-pay | Admitting: Internal Medicine

## 2017-11-01 ENCOUNTER — Other Ambulatory Visit (HOSPITAL_COMMUNITY): Payer: Self-pay | Admitting: Respiratory Therapy

## 2017-11-01 DIAGNOSIS — R062 Wheezing: Secondary | ICD-10-CM

## 2017-11-02 ENCOUNTER — Ambulatory Visit: Payer: Medicare Other | Admitting: Internal Medicine

## 2017-11-08 ENCOUNTER — Inpatient Hospital Stay (HOSPITAL_COMMUNITY): Admission: RE | Admit: 2017-11-08 | Payer: Medicare Other | Source: Ambulatory Visit

## 2017-11-20 ENCOUNTER — Ambulatory Visit: Payer: Medicare Other | Admitting: Internal Medicine

## 2018-02-15 ENCOUNTER — Telehealth: Payer: Self-pay | Admitting: Internal Medicine

## 2018-02-15 ENCOUNTER — Ambulatory Visit: Payer: Medicare Other | Admitting: Gastroenterology

## 2018-02-15 ENCOUNTER — Encounter: Payer: Self-pay | Admitting: Internal Medicine

## 2018-02-15 NOTE — Telephone Encounter (Signed)
PATIENT WAS A NO SHOW AND LETTER SENT  °

## 2018-07-15 ENCOUNTER — Other Ambulatory Visit: Payer: Medicare Other | Admitting: Adult Health

## 2018-09-02 ENCOUNTER — Other Ambulatory Visit (HOSPITAL_COMMUNITY): Payer: Self-pay | Admitting: Adult Health

## 2018-09-02 ENCOUNTER — Encounter: Payer: Self-pay | Admitting: Adult Health

## 2018-09-02 ENCOUNTER — Ambulatory Visit (INDEPENDENT_AMBULATORY_CARE_PROVIDER_SITE_OTHER): Payer: Medicare Other | Admitting: Adult Health

## 2018-09-02 ENCOUNTER — Other Ambulatory Visit (HOSPITAL_COMMUNITY)
Admission: RE | Admit: 2018-09-02 | Discharge: 2018-09-02 | Disposition: A | Discharge status: Home / Self Care | Payer: Medicare Other | Source: Ambulatory Visit | Attending: Adult Health | Admitting: Adult Health

## 2018-09-02 ENCOUNTER — Other Ambulatory Visit: Payer: Self-pay

## 2018-09-02 VITALS — BP 131/68 | HR 82 | Ht 62.5 in | Wt 153.0 lb

## 2018-09-02 DIAGNOSIS — Z1211 Encounter for screening for malignant neoplasm of colon: Secondary | ICD-10-CM | POA: Diagnosis not present

## 2018-09-02 DIAGNOSIS — R87622 Low grade squamous intraepithelial lesion on cytologic smear of vagina (LGSIL): Secondary | ICD-10-CM | POA: Diagnosis not present

## 2018-09-02 DIAGNOSIS — Z08 Encounter for follow-up examination after completed treatment for malignant neoplasm: Secondary | ICD-10-CM

## 2018-09-02 DIAGNOSIS — Z1151 Encounter for screening for human papillomavirus (HPV): Secondary | ICD-10-CM | POA: Diagnosis not present

## 2018-09-02 DIAGNOSIS — R87629 Unspecified abnormal cytological findings in specimens from vagina: Secondary | ICD-10-CM

## 2018-09-02 DIAGNOSIS — Z90710 Acquired absence of both cervix and uterus: Secondary | ICD-10-CM | POA: Insufficient documentation

## 2018-09-02 DIAGNOSIS — Z87898 Personal history of other specified conditions: Secondary | ICD-10-CM | POA: Diagnosis not present

## 2018-09-02 DIAGNOSIS — R599 Enlarged lymph nodes, unspecified: Secondary | ICD-10-CM

## 2018-09-02 DIAGNOSIS — Z1231 Encounter for screening mammogram for malignant neoplasm of breast: Secondary | ICD-10-CM

## 2018-09-02 DIAGNOSIS — Z1212 Encounter for screening for malignant neoplasm of rectum: Secondary | ICD-10-CM

## 2018-09-02 DIAGNOSIS — R52 Pain, unspecified: Secondary | ICD-10-CM

## 2018-09-02 DIAGNOSIS — Z01419 Encounter for gynecological examination (general) (routine) without abnormal findings: Secondary | ICD-10-CM

## 2018-09-02 DIAGNOSIS — Z86001 Personal history of in-situ neoplasm of cervix uteri: Secondary | ICD-10-CM | POA: Diagnosis not present

## 2018-09-02 LAB — HEMOCCULT GUIAC POC 1CARD (OFFICE): Fecal Occult Blood, POC: NEGATIVE

## 2018-09-02 NOTE — Progress Notes (Signed)
Patient ID: Susan Baker, female   DOB: [birth year], 58 y.o.   MRN: [id number] History of Present Illness: Susan Baker is a 58 year old white female, divorced, sp hysterectomy for uterine cancer and has had abnormal vaginal pap, had CIN I, on colpo.  PCP is Dr Maudie Mercury.    Current Medications, Allergies, Past Medical History, Past Surgical History, Family History and Social History were reviewed in [employer] record.     Review of Systems: Patient denies any headaches, hearing loss, fatigue, blurred vision, shortness of breath, chest pain, abdominal pain, problems with bowel movements, urination, or intercourse(not active). No  mood swings.+body aches, worse in knees now    Physical Exam:BP 131/68 (BP Location: Right Arm, Patient Position: Sitting, Cuff Size: Normal)   Pulse 82   Ht 5' 2.5" (1.588 m)   Wt 153 lb (69.4 kg)   BMI 27.54 kg/m  General:  Well developed, well nourished, no acute distress Skin:  Warm and dry Neck:  Midline trachea, normal thyroid, good ROM, has firm 1-2 cm oval, tender lymph node on left  Lungs; Clear to auscultation bilaterally Breast:  No dominant palpable mass, retraction, or nipple discharge Cardiovascular: Regular rate and rhythm Abdomen:  Soft, non tender, no hepatosplenomegaly Pelvic:  External genitalia is normal in appearance, no lesions.  The vagina pale with loss of moisture and rugae. Urethra has no lesions or masses. The cervix and uterus are absent, pap with HPV performed.  No adnexal masses or tenderness noted.Bladder is non tender, no masses felt. Rectal: Good sphincter tone, no polyps, or hemorrhoids felt.  Hemoccult negative. Extremities/musculoskeletal:  No swelling or varicosities noted, no clubbing or cyanosis Psych:  No mood changes, alert and cooperative,seems happy Fall risk is low. PHQ 2 score 1. Examination chaperoned by Shela Nevin RN.  Impression: 1. Vaginal Pap smear following hysterectomy for malignancy   2.  Encounter for well woman exam with routine gynecological exam   3. Abnormal Pap smear of vagina   4. Screening for colorectal cancer   5. Enlarged lymph node   6. Body aches       Plan: Scheduled neck US 3/5 at 2:30 pm at [place] [facility] and ESR Pap in 1 year  Mammogram advised yearly  Colonoscopy per GI

## 2018-09-03 LAB — SEDIMENTATION RATE: SED RATE: 5 mm/h (ref 0–40)

## 2018-09-03 LAB — ANA: ANA: POSITIVE — AB

## 2018-09-05 ENCOUNTER — Ambulatory Visit (HOSPITAL_COMMUNITY): Payer: Medicare Other

## 2018-09-05 LAB — CYTOLOGY - PAP: HPV: NOT DETECTED

## 2018-09-06 ENCOUNTER — Ambulatory Visit (HOSPITAL_COMMUNITY)
Admission: RE | Admit: 2018-09-06 | Discharge: 2018-09-06 | Disposition: A | Discharge status: Home / Self Care | Payer: Medicare Other | Source: Ambulatory Visit | Attending: Adult Health | Admitting: Adult Health

## 2018-09-06 DIAGNOSIS — Z1231 Encounter for screening mammogram for malignant neoplasm of breast: Secondary | ICD-10-CM | POA: Diagnosis present

## 2018-09-06 DIAGNOSIS — R599 Enlarged lymph nodes, unspecified: Secondary | ICD-10-CM | POA: Diagnosis present

## 2018-09-09 ENCOUNTER — Telehealth: Payer: Self-pay | Admitting: Adult Health

## 2018-09-09 DIAGNOSIS — R52 Pain, unspecified: Secondary | ICD-10-CM

## 2018-09-09 DIAGNOSIS — R768 Other specified abnormal immunological findings in serum: Secondary | ICD-10-CM

## 2018-09-09 NOTE — Telephone Encounter (Signed)
Left message to call me about results. °

## 2018-09-09 NOTE — Telephone Encounter (Signed)
Pt aware that [place] of neck showed benign lymph node and that pap abnormal again, will get colpo,with Dr Glo Herring,  and [facility] +, will refer to rheumatology,

## 2018-09-25 ENCOUNTER — Encounter: Payer: Medicare Other | Admitting: Obstetrics and Gynecology

## 2018-10-22 ENCOUNTER — Ambulatory Visit: Payer: Medicare Other | Admitting: Rheumatology

## 2018-11-11 ENCOUNTER — Telehealth: Payer: Self-pay | Admitting: *Deleted

## 2018-11-11 NOTE — Telephone Encounter (Signed)
Pt aware no visitors or children at tomorrow's appt. Also, pt hasn't come in contact with anyone in the last month that has been confirmed or suspected of having Covid-19. Pt states she was having some symptoms 1 month ago and quarantined, but no symptoms now. No symptoms in the last 2 weeks. Pt plans on coming to appt. Pt will bring her own mask. [place]

## 2018-11-12 ENCOUNTER — Other Ambulatory Visit: Payer: Self-pay

## 2018-11-12 ENCOUNTER — Ambulatory Visit (INDEPENDENT_AMBULATORY_CARE_PROVIDER_SITE_OTHER): Payer: Medicare Other | Admitting: Obstetrics and Gynecology

## 2018-11-12 ENCOUNTER — Encounter: Payer: Self-pay | Admitting: Obstetrics and Gynecology

## 2018-11-12 ENCOUNTER — Other Ambulatory Visit: Payer: Self-pay | Admitting: Obstetrics and Gynecology

## 2018-11-12 VITALS — BP 115/76 | HR 74 | Temp 98.2°F | Ht 62.2 in | Wt 152.2 lb

## 2018-11-12 DIAGNOSIS — R87612 Low grade squamous intraepithelial lesion on cytologic smear of cervix (LGSIL): Secondary | ICD-10-CM | POA: Diagnosis not present

## 2018-11-12 DIAGNOSIS — R87629 Unspecified abnormal cytological findings in specimens from vagina: Secondary | ICD-10-CM | POA: Diagnosis not present

## 2018-11-12 MED ORDER — ESTROGENS, CONJUGATED 0.625 MG/GM VA CREA: 1.0000 | TOPICAL_CREAM | VAGINAL | 2 refills | Status: DC

## 2018-11-12 NOTE — Patient Instructions (Signed)
Atrophic Vaginitis Atrophic vaginitis is a condition in which the tissues that line the vagina become dry and thin. This condition occurs in women who have stopped having their period. It is caused by a drop in a female hormone (estrogen). This hormone helps:  To keep the vagina moist.  To make a clear fluid. This clear fluid helps: ? To make the vagina ready for sex. ? To protect the vagina from infection. If the lining of the vagina is dry and thin, it may cause irritation, burning, or itchiness. It may also:  Make sex painful.  Make an exam of your vagina painful.  Cause bleeding.  Make you lose interest in sex.  Cause a burning feeling when you pee (urinate).  Cause a brown or yellow fluid to come from your vagina. Some women do not have symptoms. Follow these instructions at home: Medicines  Take over-the-counter and prescription medicines only as told by your doctor.  Do not use herbs or other medicines unless your doctor says it is okay.  Use medicines for for dryness. These include: ? Oils to make the vagina soft. ? Creams. ? Moisturizers. General instructions  Do not douche.  Do not use products that can make your vagina dry. These include: ? Scented sprays. ? Scented tampons. ? Scented soaps.  Sex can help increase blood flow and soften the tissue in the vagina. If it hurts to have sex: ? Tell your partner. ? Use products to make sex more comfortable. Use these only as told by your doctor. Contact a doctor if you:  Have discharge from the vagina that is different than usual.  Have a bad smell coming from your vagina.  Have new symptoms.  Do not get better.  Get worse. Summary  Atrophic vaginitis is a condition in which the lining of the vagina becomes dry and thin.  This condition affects women who have stopped having their periods.  Treatment may include using products that help make the vagina soft.  Call a doctor if do not get better with  treatment. This information is not intended to replace advice given to you by your health care provider. Make sure you discuss any questions you have with your health care provider. Document Released: 12/06/2007 Document Revised: 07/02/2017 Document Reviewed: 07/02/2017 Elsevier Interactive Patient Education  2019 [employer]  

## 2018-11-12 NOTE — Progress Notes (Addendum)
Patient ID: Susan Baker, female   DOB: February 05, 1961, 58 y.o.   MRN: [id number]   [name] 58 y.o. [id number] here for colposcopy for low-grade squamous intraepithelial neoplasia (LGSIL - encompassing HPV,mild dysplasia,CIN I) w/ a few cells suggestive of a higher grade lesion, pap smear on 09/02/2018. Pt has had hysterectomy due to uterine cancer.   Patient given informed consent, signed copy in the chart, time out was performed.  Placed in lithotomy position after application of acetic acid. Sprayed w/ numbing agent for biopsy. Efforts to obtain a biopsy were unsuccessful, the biopsy efforts would not grasp the scar line, which had no suspicious area that were clinically suspicious for cancer or precancer Colposcopy adequate? Yes   biopsies obtained at 20 along old hysterectomy scar line.   ECC specimen not obtained.  Specimens were inadequate for submission for pathology, no visible tissue in sample jar or biopsy device..   Colposcopy IMPRESSION: 1. Scar tissue s/p hysterectomy  A:  1. Normal scar tissue s/p hysteretctomy 2. atrophic vaginitis  P 1. F/u 6 month with PAP 2. Premarin vaginal cream, use nightly 3 times a week for 6 weeks, discontinue 1 week before next PAP  Patient was given post procedure instructions. Will follow up pathology and manage accordingly.  Routine preventative health maintenance measures emphasized.   By signing my name below, I, Samul Dada, attest that this documentation has been prepared under the direction and in the presence of Jonnie Kind, MD. Electronically Signed: Samul Dada Medical Scribe. 11/12/18. 3:08 PM.  I personally performed the services described in this documentation, which was SCRIBED in my presence. The recorded information has been reviewed and considered accurate. It has been edited as necessary during review. Jonnie Kind, MD

## 2018-11-13 ENCOUNTER — Other Ambulatory Visit: Payer: Self-pay | Admitting: *Deleted

## 2018-11-13 MED ORDER — ESTRADIOL 0.1 MG/GM VA CREA: TOPICAL_CREAM | VAGINAL | 1 refills | Status: DC

## 2018-11-13 NOTE — Progress Notes (Deleted)
Office Visit Note  Patient: Susan Baker             Date of Birth: 09-25-1960           MRN: [id number]             PCP: Jani Gravel, MD Referring: Estill Dooms, NP Visit Date: 11/22/2018 Occupation: @GUAROCC @  Subjective:  No chief complaint on file.   History of Present Illness: Susan Baker is a 58 y.o. female ***   Activities of Daily Living:  Patient reports morning stiffness for *** {minute/hour:19697}.   Patient {ACTIONS;DENIES/REPORTS:21021675::"Denies"} nocturnal pain.  Difficulty dressing/grooming: {ACTIONS;DENIES/REPORTS:21021675::"Denies"} Difficulty climbing stairs: {ACTIONS;DENIES/REPORTS:21021675::"Denies"} Difficulty getting out of chair: {ACTIONS;DENIES/REPORTS:21021675::"Denies"} Difficulty using hands for taps, buttons, cutlery, and/or writing: {ACTIONS;DENIES/REPORTS:21021675::"Denies"}  No Rheumatology ROS completed.   PMFS History:  Patient Active Problem List   Diagnosis Date Noted  . Enlarged lymph node 09/02/2018  . Screening for colorectal cancer 09/02/2018  . Encounter for well woman exam with routine gynecological exam 09/02/2018  . Vaginal Pap smear following hysterectomy for malignancy 09/02/2018  . Abnormal ThinPrep Pap test of vagina 04/25/2017  . Fatigue 04/19/2017  . Well woman exam with routine gynecological exam 04/19/2017  . 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 Hematochezia   . Third degree hemorrhoids   . Hiatal hernia   . Dysphagia 11/02/2015  . Lymphadenopathy 11/02/2015  . Rectal bleeding 11/02/2015  . Postcoital bleeding 09/15/2015  . Urinary frequency 05/14/2014  . Hematuria 05/14/2014  . Abnormal Pap smear of vagina 03/04/2014  . Osteoporosis, unspecified 03/03/2014  . History of abnormal cervical Pap smear 02/24/2014  . Postmenopausal 02/24/2014  . 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 Fibromyalgia 02/18/2013  . Depression 02/18/2013  .  Osteoarthritis (arthritis due to wear and tear of joints) 02/18/2013  . Bulging disc 02/18/2013  . Nausea with vomiting 11/13/2012  . Reflux esophagitis 12/12/2010  . Gastric erosions 12/12/2010  . RUQ abdominal pain 11/07/2010  . Nausea 11/07/2010  . Pancreatic lesion 11/07/2010  . Fatty liver 11/07/2010  . Family history of cirrhosis of liver 11/07/2010  . Kidney cysts 11/07/2010    Past Medical History:  Diagnosis Date  . Abnormal Pap smear   . Abnormal ThinPrep Pap test of vagina 04/25/2017   LSIL will get colp__________  . Anxiety   . Bulging disc    [facility]  . Depression 02/18/2013  . Fibromyalgia   . GERD (gastroesophageal reflux disease)    egd with RE per patient, remote  . Hematuria 05/14/2014  . History of abnormal cervical Pap smear 02/24/2014  . Osteoarthritis (arthritis due to wear and tear of joints)   . Osteoporosis   . Osteoporosis, unspecified 03/03/2014  . Postcoital bleeding 09/15/2015  . Postmenopausal 02/24/2014  . PUD (peptic ulcer disease)    bleeding PUD per patient, remote, On Daypro  . Urinary frequency 05/14/2014  . Uterine cancer ([facility])   . Vaginal Pap smear, abnormal     Family History  Problem Relation Age of Onset  . Cirrhosis Father        etoh, died with liver cancer  . Diabetes Father   . Cirrhosis Paternal Uncle        multiple, etoh  . Pancreatic cancer Paternal Grandmother   . Diabetes Paternal Grandmother   . Kidney failure Mother   . Diabetes Mother   . Other Brother  crohn's disease  . Hyperlipidemia Brother   . Fibromyalgia Daughter   . Diabetes Maternal Grandmother   . Colon cancer Maternal Uncle    Past Surgical History:  Procedure Laterality Date  . COLONOSCOPY WITH PROPOFOL N/A 11/22/2015   Dr.Rourk- the entire examined colon is normal, the examined portion of the ileum was normal, non-bleeding internal hemorrhoids. no specimens collected.   Marland Kitchen ECTOPIC PREGNANCY SURGERY     twice  . ESOPHAGEAL  DILATION N/A 11/22/2015   Procedure: ESOPHAGEAL DILATION;  Surgeon: Daneil Dolin, MD;  Location: [facility];  Service: Endoscopy;  Laterality: N/A;  . ESOPHAGOGASTRODUODENOSCOPY  12/01/2010   Dr. Gala Romney: mild distal ERE, antral erosions due to NSAIDS, no H.Pylori  . ESOPHAGOGASTRODUODENOSCOPY (EGD) WITH PROPOFOL N/A 11/22/2015   Dr.Rourk- grade A esophagitis, dilated. small hiatal hernia, normal stomach, normal second portion of the duodenum.  . hysterectomy for uterine cancer     partial   Social History   Social History Narrative  . Not on file    There is no immunization history on file for this patient.   Objective: Vital Signs: There were no vitals taken for this visit.   Physical Exam   Musculoskeletal Exam: ***  CDAI Exam: CDAI Score: Not documented Patient Global Assessment: Not documented; Provider Global Assessment: Not documented Swollen: Not documented; Tender: Not documented Joint Exam   Not documented   There is currently no information documented on the homunculus. Go to the Rheumatology activity and complete the homunculus joint exam.  Investigation: Findings:  09/02/18: sed rate 5, ANA positive (no titer)  Component     Latest Ref Rng & Units 09/02/2018  Anti Nuclear Antibody (ANA)     Negative Positive (A)  Sed Rate     0 - 40 mm/hr 5   Imaging: No results found.  Recent Labs: Lab Results  Component Value Date   WBC 7.7 04/19/2017   HGB 13.8 04/19/2017   PLT 239 04/19/2017   NA 144 04/19/2017   K 4.3 04/19/2017   CL 105 04/19/2017   CO2 22 04/19/2017   GLUCOSE 58 (L) 04/19/2017   BUN 11 04/19/2017   CREATININE 0.75 04/19/2017   BILITOT 0.2 04/19/2017   ALKPHOS 74 04/19/2017   AST 21 04/19/2017   ALT 17 04/19/2017   PROT 6.9 04/19/2017   ALBUMIN 4.5 04/19/2017   CALCIUM 9.5 04/19/2017   GFRAA 104 04/19/2017    Speciality Comments: No specialty comments available.  Procedures:  No procedures performed Allergies: Cymbalta  [duloxetine hcl]; Daypro [oxaprozin]; Sulfa antibiotics; Trazodone and nefazodone; and Nucynta [tapentadol]   Assessment / Plan:     Visit Diagnoses: No diagnosis found.   Orders: No orders of the defined types were placed in this encounter.  No orders of the defined types were placed in this encounter.   Face-to-face time spent with patient was *** minutes. Greater than 50% of time was spent in counseling and coordination of care.  Follow-Up Instructions: No follow-ups on file.   Ofilia Neas, PA-C  Note - This record has been created using Dragon software.  Chart creation errors have been sought, but may not always  have been located. Such creation errors do not reflect on  the standard of medical care.

## 2018-11-22 ENCOUNTER — Ambulatory Visit: Payer: Medicare Other | Admitting: Rheumatology

## 2018-11-26 NOTE — Addendum Note (Signed)
Addended by: Octaviano Glow on: 11/26/2018 11:04 AM   Modules accepted: Orders

## 2018-12-18 NOTE — Progress Notes (Deleted)
Office Visit Note  Patient: Susan Baker             Date of Birth: 17-Jul-1960           MRN: [id number]             PCP: Jani Gravel, MD Referring: Estill Dooms, NP Visit Date: 12/30/2018 Occupation: @GUAROCC @  Subjective:  No chief complaint on file.   History of Present Illness: Susan Baker is a 58 y.o. female ***   Activities of Daily Living:  Patient reports morning stiffness for *** {minute/hour:19697}.   Patient {ACTIONS;DENIES/REPORTS:21021675::"Denies"} nocturnal pain.  Difficulty dressing/grooming: {ACTIONS;DENIES/REPORTS:21021675::"Denies"} Difficulty climbing stairs: {ACTIONS;DENIES/REPORTS:21021675::"Denies"} Difficulty getting out of chair: {ACTIONS;DENIES/REPORTS:21021675::"Denies"} Difficulty using hands for taps, buttons, cutlery, and/or writing: {ACTIONS;DENIES/REPORTS:21021675::"Denies"}  No Rheumatology ROS completed.   PMFS History:  Patient Active Problem List   Diagnosis Date Noted  . Enlarged lymph node 09/02/2018  . Screening for colorectal cancer 09/02/2018  . Encounter for well woman exam with routine gynecological exam 09/02/2018  . Vaginal Pap smear following hysterectomy for malignancy 09/02/2018  . Abnormal ThinPrep Pap test of vagina 04/25/2017  . Fatigue 04/19/2017  . Well woman exam with routine gynecological exam 04/19/2017  . 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 Hematochezia   . Third degree hemorrhoids   . Hiatal hernia   . Dysphagia 11/02/2015  . Lymphadenopathy 11/02/2015  . Rectal bleeding 11/02/2015  . Postcoital bleeding 09/15/2015  . Urinary frequency 05/14/2014  . Hematuria 05/14/2014  . Abnormal Pap smear of vagina 03/04/2014  . Osteoporosis, unspecified 03/03/2014  . History of abnormal cervical Pap smear 02/24/2014  . Postmenopausal 02/24/2014  . 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 Fibromyalgia 02/18/2013  . Depression 02/18/2013  .  Osteoarthritis (arthritis due to wear and tear of joints) 02/18/2013  . Bulging disc 02/18/2013  . Nausea with vomiting 11/13/2012  . Reflux esophagitis 12/12/2010  . Gastric erosions 12/12/2010  . RUQ abdominal pain 11/07/2010  . Nausea 11/07/2010  . Pancreatic lesion 11/07/2010  . Fatty liver 11/07/2010  . Family history of cirrhosis of liver 11/07/2010  . Kidney cysts 11/07/2010    Past Medical History:  Diagnosis Date  . Abnormal Pap smear   . Abnormal ThinPrep Pap test of vagina 04/25/2017   LSIL will get colp__________  . Anxiety   . Bulging disc    [facility]  . Depression 02/18/2013  . Fibromyalgia   . GERD (gastroesophageal reflux disease)    egd with RE per patient, remote  . Hematuria 05/14/2014  . History of abnormal cervical Pap smear 02/24/2014  . Osteoarthritis (arthritis due to wear and tear of joints)   . Osteoporosis   . Osteoporosis, unspecified 03/03/2014  . Postcoital bleeding 09/15/2015  . Postmenopausal 02/24/2014  . PUD (peptic ulcer disease)    bleeding PUD per patient, remote, On Daypro  . Urinary frequency 05/14/2014  . Uterine cancer ([facility])   . Vaginal Pap smear, abnormal     Family History  Problem Relation Age of Onset  . Cirrhosis Father        etoh, died with liver cancer  . Diabetes Father   . Cirrhosis Paternal Uncle        multiple, etoh  . Pancreatic cancer Paternal Grandmother   . Diabetes Paternal Grandmother   . Kidney failure Mother   . Diabetes Mother   . Other Brother  crohn's disease  . Hyperlipidemia Brother   . Fibromyalgia Daughter   . Diabetes Maternal Grandmother   . Colon cancer Maternal Uncle    Past Surgical History:  Procedure Laterality Date  . COLONOSCOPY WITH PROPOFOL N/A 11/22/2015   Dr.Rourk- the entire examined colon is normal, the examined portion of the ileum was normal, non-bleeding internal hemorrhoids. no specimens collected.   Marland Kitchen ECTOPIC PREGNANCY SURGERY     twice  . ESOPHAGEAL  DILATION N/A 11/22/2015   Procedure: ESOPHAGEAL DILATION;  Surgeon: Daneil Dolin, MD;  Location: [facility];  Service: Endoscopy;  Laterality: N/A;  . ESOPHAGOGASTRODUODENOSCOPY  12/01/2010   Dr. Gala Romney: mild distal ERE, antral erosions due to NSAIDS, no H.Pylori  . ESOPHAGOGASTRODUODENOSCOPY (EGD) WITH PROPOFOL N/A 11/22/2015   Dr.Rourk- grade A esophagitis, dilated. small hiatal hernia, normal stomach, normal second portion of the duodenum.  . hysterectomy for uterine cancer     partial   Social History   Social History Narrative  . Not on file    There is no immunization history on file for this patient.   Objective: Vital Signs: There were no vitals taken for this visit.   Physical Exam   Musculoskeletal Exam: ***  CDAI Exam: CDAI Score: - Patient Global: -; Provider Global: - Swollen: -; Tender: - Joint Exam   No joint exam has been documented for this visit   There is currently no information documented on the homunculus. Go to the Rheumatology activity and complete the homunculus joint exam.  Investigation: No additional findings. Component     Latest Ref Rng & Units 09/02/2018  Anti Nuclear Antibody (ANA)     Negative Positive (A)  Sed Rate     0 - 40 mm/hr 5   Imaging: No results found.  Recent Labs: Lab Results  Component Value Date   WBC 7.7 04/19/2017   HGB 13.8 04/19/2017   PLT 239 04/19/2017   NA 144 04/19/2017   K 4.3 04/19/2017   CL 105 04/19/2017   CO2 22 04/19/2017   GLUCOSE 58 (L) 04/19/2017   BUN 11 04/19/2017   CREATININE 0.75 04/19/2017   BILITOT 0.2 04/19/2017   ALKPHOS 74 04/19/2017   AST 21 04/19/2017   ALT 17 04/19/2017   PROT 6.9 04/19/2017   ALBUMIN 4.5 04/19/2017   CALCIUM 9.5 04/19/2017   GFRAA 104 04/19/2017    Speciality Comments: No specialty comments available.  Procedures:  No procedures performed Allergies: Cymbalta [duloxetine hcl], Daypro [oxaprozin], Sulfa antibiotics, Trazodone and nefazodone, and Nucynta  [tapentadol]   Assessment / Plan:     Visit Diagnoses: No diagnosis found.   Orders: No orders of the defined types were placed in this encounter.  No orders of the defined types were placed in this encounter.   Face-to-face time spent with patient was *** minutes. Greater than 50% of time was spent in counseling and coordination of care.  Follow-Up Instructions: No follow-ups on file.   Ofilia Neas, PA-C  Note - This record has been created using Dragon software.  Chart creation errors have been sought, but may not always  have been located. Such creation errors do not reflect on  the standard of medical care.

## 2018-12-19 ENCOUNTER — Ambulatory Visit: Payer: Self-pay | Admitting: Rheumatology

## 2018-12-27 ENCOUNTER — Ambulatory Visit: Payer: Medicare Other | Admitting: Rheumatology

## 2018-12-30 ENCOUNTER — Ambulatory Visit: Payer: Medicare Other | Admitting: Rheumatology

## 2019-01-27 ENCOUNTER — Ambulatory Visit: Payer: Medicare Other | Admitting: Rheumatology

## 2019-03-11 NOTE — Progress Notes (Signed)
Office Visit Note  Patient: Susan Baker             Date of Birth: 09/16/1960           MRN: [id number]             PCP: Jani Gravel, MD Referring: Estill Dooms, NP Visit Date: 03/24/2019 Occupation: Disability  Subjective:  Pain in multiple joints and positive ANA.   History of Present Illness: [name] is a 58 y.o. female with history of fibromyalgia, osteoarthritis and positive ANA.  She states she has had arthritis in her joints for many years.  Her symptoms have been getting worse in the last 1 year.  She complains of pain and discomfort in the bilateral hands, bilateral knees, bilateral feet and her lower back.  She states her left knee joint is more painful than the right.  She also has intermittent swelling in her left ankle joint.  She was diagnosed with fibromyalgia syndrome in 1998.  She has generalized pain and discomfort from that.  She also complains of discomfort in her cervical, thoracic and lumbar spine.  She has had MRI of her spine.  She has lumbar spine facet joint arthropathy.  Which causes chronic pain.  Patient states she was diagnosed with psoriasis in 2011.  Which is in her scalp and her ears.  She has seen a dermatologist in the past but she does not go to a dermatologist now.  Activities of Daily Living:  Patient reports morning stiffness for several hours.   Patient Reports nocturnal pain.  Difficulty dressing/grooming: Denies Difficulty climbing stairs: Reports Difficulty getting out of chair: Reports Difficulty using hands for taps, buttons, cutlery, and/or writing: Reports  Review of Systems  Constitutional: Positive for fatigue. Negative for night sweats, weight gain and weight loss.  HENT: Positive for mouth dryness and nose dryness. Negative for mouth sores, trouble swallowing and trouble swallowing.   Eyes: Positive for dryness. Negative for pain, redness and visual disturbance.  Respiratory: Negative for cough, shortness of breath,  wheezing and difficulty breathing.   Cardiovascular: Negative for chest pain, palpitations, hypertension, irregular heartbeat and swelling in legs/feet.  Gastrointestinal: Negative for abdominal pain, blood in stool, constipation and diarrhea.  Endocrine: Negative for increased urination.  Genitourinary: Negative for difficulty urinating, painful urination and vaginal dryness.  Musculoskeletal: Positive for arthralgias, joint pain and morning stiffness. Negative for joint swelling, myalgias, muscle weakness, muscle tenderness and myalgias.  Skin: Positive for rash. Negative for color change, hair loss, skin tightness, ulcers and sensitivity to sunlight.  Allergic/Immunologic: Negative for susceptible to infections.  Neurological: Positive for headaches. Negative for dizziness, numbness, memory loss, night sweats and weakness.  Hematological: Positive for swollen glands. Negative for bruising/bleeding tendency.  Psychiatric/Behavioral: Positive for depressed mood and sleep disturbance. Negative for confusion. The patient is nervous/anxious.     PMFS History:  Patient Active Problem List   Diagnosis Date Noted  . Enlarged lymph node 09/02/2018  . Screening for colorectal cancer 09/02/2018  . Encounter for well woman exam with routine gynecological exam 09/02/2018  . Vaginal Pap smear following hysterectomy for malignancy 09/02/2018  . Abnormal ThinPrep Pap test of vagina 04/25/2017  . Fatigue 04/19/2017  . Well woman exam with routine gynecological exam 04/19/2017  . 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 Hematochezia   . Third degree hemorrhoids   . Hiatal hernia   . Dysphagia 11/02/2015  . Lymphadenopathy 11/02/2015  . Rectal bleeding 11/02/2015  .  Postcoital bleeding 09/15/2015  . Urinary frequency 05/14/2014  . Hematuria 05/14/2014  . Abnormal Pap smear of vagina 03/04/2014  . Osteoporosis 03/03/2014  . History of abnormal cervical Pap smear 02/24/2014  .  Postmenopausal 02/24/2014  . 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 Fibromyalgia 02/18/2013  . Depression 02/18/2013  . Osteoarthritis (arthritis due to wear and tear of joints) 02/18/2013  . Bulging disc 02/18/2013  . Nausea with vomiting 11/13/2012  . Reflux esophagitis 12/12/2010  . Gastric erosions 12/12/2010  . RUQ abdominal pain 11/07/2010  . Nausea 11/07/2010  . Pancreatic lesion 11/07/2010  . Fatty liver 11/07/2010  . Family history of cirrhosis of liver 11/07/2010  . Kidney cysts 11/07/2010    Past Medical History:  Diagnosis Date  . Abnormal Pap smear   . Abnormal ThinPrep Pap test of vagina 04/25/2017   LSIL will get colp__________  . Anxiety   . Bulging disc    [facility]  . Depression 02/18/2013  . Fibromyalgia   . GERD (gastroesophageal reflux disease)    egd with RE per patient, remote  . Hematuria 05/14/2014  . History of abnormal cervical Pap smear 02/24/2014  . Osteoarthritis (arthritis due to wear and tear of joints)   . Osteoporosis   . Osteoporosis, unspecified 03/03/2014  . Postcoital bleeding 09/15/2015  . Postmenopausal 02/24/2014  . PUD (peptic ulcer disease)    bleeding PUD per patient, remote, On Daypro  . Urinary frequency 05/14/2014  . Uterine cancer ([place])   . Vaginal Pap smear, abnormal     Family History  Problem Relation Age of Onset  . Cirrhosis Father        etoh, died with liver cancer  . Diabetes Father   . Cirrhosis Paternal Uncle        multiple, etoh  . Pancreatic cancer Paternal Grandmother   . Diabetes Paternal Grandmother   . Kidney failure Mother   . Diabetes Mother   . Other Brother        crohn's disease  . Hyperlipidemia Brother   . Fibromyalgia Daughter   . Diabetes Maternal Grandmother   . Healthy Son   . Colon cancer Maternal Uncle    Past Surgical History:  Procedure Laterality Date  . COLONOSCOPY WITH PROPOFOL N/A 11/22/2015   Dr.Rourk- the entire  examined colon is normal, the examined portion of the ileum was normal, non-bleeding internal hemorrhoids. no specimens collected.   Marland Kitchen ECTOPIC PREGNANCY SURGERY     twice  . ESOPHAGEAL DILATION N/A 11/22/2015   Procedure: ESOPHAGEAL DILATION;  Surgeon: Daneil Dolin, MD;  [facility];  Service: Endoscopy;  Laterality: N/A;  . ESOPHAGOGASTRODUODENOSCOPY  12/01/2010   Dr. Gala Romney: mild distal ERE, antral erosions due to NSAIDS, no H.Pylori  . ESOPHAGOGASTRODUODENOSCOPY (EGD) WITH PROPOFOL N/A 11/22/2015   Dr.Rourk- grade A esophagitis, dilated. small hiatal hernia, normal stomach, normal second portion of the duodenum.  . hysterectomy for uterine cancer     partial   Social History   Social History Narrative  . Not on file    There is no immunization history on file for this patient.   Objective: Vital Signs: BP 122/78 (BP Location: Right Arm, Patient Position: Sitting, Cuff Size: Normal)   Pulse 76   Resp 15   Ht 5' 2.5" (1.588 m)   Wt 151 lb 6.4 oz (68.7 kg)   BMI 27.25 kg/m    Physical Exam Vitals signs and nursing  note reviewed.  Constitutional:      Appearance: She is well-developed.  HENT:     Head: Normocephalic and atraumatic.  Eyes:     Conjunctiva/sclera: Conjunctivae normal.  Neck:     Musculoskeletal: Normal range of motion.     Comments: 1 palpable lymph node was noted in the left posterior cervical chain.  Patient states is been there for years. Cardiovascular:     Rate and Rhythm: Normal rate and regular rhythm.     Heart sounds: Normal heart sounds.  Pulmonary:     Effort: Pulmonary effort is normal.     Breath sounds: Normal breath sounds.  Abdominal:     General: Bowel sounds are normal.     Palpations: Abdomen is soft.  Lymphadenopathy:     Cervical: No cervical adenopathy.  Skin:    General: Skin is warm and dry.     Capillary Refill: Capillary refill takes less than 2 seconds.     Findings: Rash present.     Comments: She has psoriasis  patch on her scalp and behind her ears.  Neurological:     Mental Status: She is alert and oriented to person, place, and time.  Psychiatric:        Behavior: Behavior normal.      Musculoskeletal Exam: C-spine thoracic and lumbar spine with good range of motion.  She has some discomfort over SI joints.  Shoulder joints, elbow joints, wrist joints, MCPs PIPs DIPs with good range of motion.  She has mild prominence of PIP and DIP joints but no synovitis was noted.  Hip joints, knee joints, ankles, MTPs PIPs DIPs with good range of motion.  She is some crepitus in her knee joints without any warmth swelling or effusion.  She has some tenderness across MTPs and PIPs but no synovitis was noted.  CDAI Exam: CDAI Score: - Patient Global: -; Provider Global: - Swollen: -; Tender: - Joint Exam   No joint exam has been documented for this visit   There is currently no information documented on the homunculus. Go to the Rheumatology activity and complete the homunculus joint exam.  Investigation: Findings:  09/02/18: ANA positive, sed rate 5  Component     Latest Ref Rng & Units 09/02/2018  Anti Nuclear Antibody (ANA)     Negative Positive (A)  Sed Rate     0 - 40 mm/hr 5   Imaging: No results found.  Recent Labs: Lab Results  Component Value Date   WBC 7.7 04/19/2017   HGB 13.8 04/19/2017   PLT 239 04/19/2017   NA 144 04/19/2017   K 4.3 04/19/2017   CL 105 04/19/2017   CO2 22 04/19/2017   GLUCOSE 58 (L) 04/19/2017   BUN 11 04/19/2017   CREATININE 0.75 04/19/2017   BILITOT 0.2 04/19/2017   ALKPHOS 74 04/19/2017   AST 21 04/19/2017   ALT 17 04/19/2017   PROT 6.9 04/19/2017   ALBUMIN 4.5 04/19/2017   CALCIUM 9.5 04/19/2017   GFRAA 104 04/19/2017    Speciality Comments: No specialty comments available.  Procedures:  No procedures performed Allergies: Cymbalta [duloxetine hcl], Daypro [oxaprozin], Sulfa antibiotics, Trazodone and nefazodone, and Nucynta [tapentadol]    Assessment / Plan:     Visit Diagnoses: Pain in both hands -she complains of pain and discomfort in her hands.  No synovitis was noted.  Clinical findings are consistent with mild osteoarthritis.  No nail pitting or nail dystrophy was noted.  Plan: XR Hand 2 View  Right, XR Hand 2 View Left.  The x-ray of bilateral hands were consistent with mild osteoarthritis.  Chronic SI joint pain -she complains of lower back pain and SI joint pain.  I reviewed x-rays and MRIs of her lumbar spine and cervical spine.  She has mild facet joint arthropathy of the lumbar region.  Plan: XR Pelvis 1-2 Views.  The x-ray of SI joints were unremarkable.  Chronic pain of both knees -she complains of discomfort in her knee joints.  No warmth swelling or effusion was noted.  Plan: XR KNEE 3 VIEW RIGHT, XR KNEE 3 VIEW LEFT.  The x-ray showed bilateral mild chondromalacia patella and right knee joint mild osteoarthritis.  Pain in both feet -she complains of discomfort in her bilateral feet.  She also gives history of intermittent swelling in her feet and ankles.  No warmth swelling or effusion was noted.  Plan: XR Foot 2 Views Right, XR Foot 2 Views Left, x-rays were consistent with osteoarthritis in bilateral feet.  Rheumatoid factor, Cyclic citrul peptide antibody, IgG, Uric acid  Arthropathy of lumbar facet joint-based on the MRI results.  She has chronic lower back pain.  Psoriasis-psoriasis patches were noted on her scalp right palm and behind her years.  Positive ANA (antinuclear antibody) - 09/02/18: ANA positive, sed rate 5 -patient has no clinical features of lupus or any other autoimmune disease on examination.  I will obtain additional labs today.  Plan: ANA, Anti-scleroderma antibody, RNP Antibody, Anti-Smith antibody, Sjogrens syndrome-A extractable nuclear antibody, Sjogrens syndrome-B extractable nuclear antibody, Anti-DNA antibody, double-stranded, C3 and C4  Osteoporosis, unspecified osteoporosis type,  unspecified pathological fracture presence-she is currently not taking any treatment.  Fibromyalgia-patient complains of generalized pain discomfort for many years.  Other fatigue-related to fibromyalgia.  Reflux esophagitis  Chronic gastric erosion  Pancreatic lesion  Fatty liver  Enlarged lymph node  Kidney cysts  History of carcinoma in situ of cervix  Hiatal hernia  Family history of cirrhosis of liver  Orders: Orders Placed This Encounter  Procedures  . XR Hand 2 View Right  . XR Hand 2 View Left  . XR Foot 2 Views Right  . XR Foot 2 Views Left  . XR Pelvis 1-2 Views  . XR KNEE 3 VIEW RIGHT  . XR KNEE 3 VIEW LEFT  . Rheumatoid factor  . Cyclic citrul peptide antibody, IgG  . ANA  . Anti-scleroderma antibody  . RNP Antibody  . Anti-Smith antibody  . Sjogrens syndrome-A extractable nuclear antibody  . Sjogrens syndrome-B extractable nuclear antibody  . Anti-DNA antibody, double-stranded  . C3 and C4  . Uric acid   No orders of the defined types were placed in this encounter.   Face-to-face time spent with patient was 50 minutes. Greater than 50% of time was spent in counseling and coordination of care.  Follow-Up Instructions: Return for +ANA, Psoriasis.   Bo Merino, MD  Note - This record has been created using Editor, commissioning.  Chart creation errors have been sought, but may not always  have been located. Such creation errors do not reflect on  the standard of medical care.

## 2019-03-24 ENCOUNTER — Ambulatory Visit: Payer: Self-pay

## 2019-03-24 ENCOUNTER — Encounter: Payer: Self-pay | Admitting: Rheumatology

## 2019-03-24 ENCOUNTER — Ambulatory Visit (INDEPENDENT_AMBULATORY_CARE_PROVIDER_SITE_OTHER): Payer: Medicare Other | Admitting: Rheumatology

## 2019-03-24 ENCOUNTER — Other Ambulatory Visit: Payer: Self-pay

## 2019-03-24 VITALS — BP 122/78 | HR 76 | Resp 15 | Ht 62.5 in | Wt 151.4 lb

## 2019-03-24 DIAGNOSIS — M533 Sacrococcygeal disorders, not elsewhere classified: Secondary | ICD-10-CM | POA: Diagnosis not present

## 2019-03-24 DIAGNOSIS — G8929 Other chronic pain: Secondary | ICD-10-CM

## 2019-03-24 DIAGNOSIS — K2100 Gastro-esophageal reflux disease with esophagitis, without bleeding: Secondary | ICD-10-CM

## 2019-03-24 DIAGNOSIS — M810 Age-related osteoporosis without current pathological fracture: Secondary | ICD-10-CM

## 2019-03-24 DIAGNOSIS — M79642 Pain in left hand: Secondary | ICD-10-CM

## 2019-03-24 DIAGNOSIS — N281 Cyst of kidney, acquired: Secondary | ICD-10-CM

## 2019-03-24 DIAGNOSIS — M797 Fibromyalgia: Secondary | ICD-10-CM

## 2019-03-24 DIAGNOSIS — K257 Chronic gastric ulcer without hemorrhage or perforation: Secondary | ICD-10-CM

## 2019-03-24 DIAGNOSIS — M25562 Pain in left knee: Secondary | ICD-10-CM

## 2019-03-24 DIAGNOSIS — M79671 Pain in right foot: Secondary | ICD-10-CM | POA: Diagnosis not present

## 2019-03-24 DIAGNOSIS — R599 Enlarged lymph nodes, unspecified: Secondary | ICD-10-CM

## 2019-03-24 DIAGNOSIS — R768 Other specified abnormal immunological findings in serum: Secondary | ICD-10-CM

## 2019-03-24 DIAGNOSIS — K869 Disease of pancreas, unspecified: Secondary | ICD-10-CM

## 2019-03-24 DIAGNOSIS — K449 Diaphragmatic hernia without obstruction or gangrene: Secondary | ICD-10-CM

## 2019-03-24 DIAGNOSIS — M79641 Pain in right hand: Secondary | ICD-10-CM

## 2019-03-24 DIAGNOSIS — Z8379 Family history of other diseases of the digestive system: Secondary | ICD-10-CM

## 2019-03-24 DIAGNOSIS — M79672 Pain in left foot: Secondary | ICD-10-CM

## 2019-03-24 DIAGNOSIS — M25561 Pain in right knee: Secondary | ICD-10-CM

## 2019-03-24 DIAGNOSIS — R5383 Other fatigue: Secondary | ICD-10-CM

## 2019-03-24 DIAGNOSIS — Z86001 Personal history of in-situ neoplasm of cervix uteri: Secondary | ICD-10-CM

## 2019-03-24 DIAGNOSIS — M47816 Spondylosis without myelopathy or radiculopathy, lumbar region: Secondary | ICD-10-CM

## 2019-03-24 DIAGNOSIS — L409 Psoriasis, unspecified: Secondary | ICD-10-CM

## 2019-03-24 DIAGNOSIS — K760 Fatty (change of) liver, not elsewhere classified: Secondary | ICD-10-CM

## 2019-03-26 LAB — RNP ANTIBODY: Ribonucleic Protein(ENA) Antibody, IgG: 1.9 AI — AB

## 2019-03-26 LAB — URIC ACID: Uric Acid, Serum: 4.9 mg/dL (ref 2.5–7.0)

## 2019-03-26 LAB — ANTI-SMITH ANTIBODY: ENA SM Ab Ser-aCnc: <1 AI

## 2019-03-26 LAB — CYCLIC CITRUL PEPTIDE ANTIBODY, IGG: Cyclic Citrullin Peptide Ab: <16 U

## 2019-03-26 LAB — C3 AND C4
C3 Complement: 138 mg/dL (ref 83–193)
C4 Complement: 26 mg/dL (ref 15–57)

## 2019-03-26 LAB — SJOGRENS SYNDROME-B EXTRACTABLE NUCLEAR ANTIBODY: SSB (La) (ENA) Antibody, IgG: <1 AI

## 2019-03-26 LAB — ANTI-DNA ANTIBODY, DOUBLE-STRANDED: ds DNA Ab: <1 [IU]/mL

## 2019-03-26 LAB — RHEUMATOID FACTOR: Rhuematoid fact SerPl-aCnc: <14 IU/mL (ref <14)

## 2019-03-26 LAB — ANTI-SCLERODERMA ANTIBODY: Scleroderma (Scl-70) (ENA) Antibody, IgG: <1 AI

## 2019-03-26 LAB — ANA: Anti Nuclear Antibody (ANA): NEGATIVE

## 2019-03-26 LAB — SJOGRENS SYNDROME-A EXTRACTABLE NUCLEAR ANTIBODY: SSA (Ro) (ENA) Antibody, IgG: <1 AI

## 2019-04-14 NOTE — Progress Notes (Deleted)
Office Visit Note  Patient: Susan Baker             Date of Birth: 08/04/1960           MRN: [id number]             PCP: Jani Gravel, MD Referring: Jani Gravel, MD Visit Date: 04/28/2019 Occupation: @GUAROCC @  Subjective:  No chief complaint on file.   History of Present Illness: Susan Baker is a 58 y.o. female ***   Activities of Daily Living:  Patient reports morning stiffness for *** {minute/hour:19697}.   Patient {ACTIONS;DENIES/REPORTS:21021675::"Denies"} nocturnal pain.  Difficulty dressing/grooming: {ACTIONS;DENIES/REPORTS:21021675::"Denies"} Difficulty climbing stairs: {ACTIONS;DENIES/REPORTS:21021675::"Denies"} Difficulty getting out of chair: {ACTIONS;DENIES/REPORTS:21021675::"Denies"} Difficulty using hands for taps, buttons, cutlery, and/or writing: {ACTIONS;DENIES/REPORTS:21021675::"Denies"}  No Rheumatology ROS completed.   PMFS History:  Patient Active Problem List   Diagnosis Date Noted  . Enlarged lymph node 09/02/2018  . Screening for colorectal cancer 09/02/2018  . Encounter for well woman exam with routine gynecological exam 09/02/2018  . Vaginal Pap smear following hysterectomy for malignancy 09/02/2018  . Abnormal ThinPrep Pap test of vagina 04/25/2017  . Fatigue 04/19/2017  . Well woman exam with routine gynecological exam 04/19/2017  . 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 Hematochezia   . Third degree hemorrhoids   . Hiatal hernia   . Dysphagia 11/02/2015  . Lymphadenopathy 11/02/2015  . Rectal bleeding 11/02/2015  . Postcoital bleeding 09/15/2015  . Urinary frequency 05/14/2014  . Hematuria 05/14/2014  . Abnormal Pap smear of vagina 03/04/2014  . Osteoporosis 03/03/2014  . History of abnormal cervical Pap smear 02/24/2014  . Postmenopausal 02/24/2014  . 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 Fibromyalgia 02/18/2013  . Depression 02/18/2013  . Osteoarthritis (arthritis due  to wear and tear of joints) 02/18/2013  . Bulging disc 02/18/2013  . Nausea with vomiting 11/13/2012  . Reflux esophagitis 12/12/2010  . Gastric erosions 12/12/2010  . RUQ abdominal pain 11/07/2010  . Nausea 11/07/2010  . Pancreatic lesion 11/07/2010  . Fatty liver 11/07/2010  . Family history of cirrhosis of liver 11/07/2010  . Kidney cysts 11/07/2010    Past Medical History:  Diagnosis Date  . Abnormal Pap smear   . Abnormal ThinPrep Pap test of vagina 04/25/2017   LSIL will get colp__________  . Anxiety   . Bulging disc    [facility]  . Depression 02/18/2013  . Fibromyalgia   . GERD (gastroesophageal reflux disease)    egd with RE per patient, remote  . Hematuria 05/14/2014  . History of abnormal cervical Pap smear 02/24/2014  . Osteoarthritis (arthritis due to wear and tear of joints)   . Osteoporosis   . Osteoporosis, unspecified 03/03/2014  . Postcoital bleeding 09/15/2015  . Postmenopausal 02/24/2014  . PUD (peptic ulcer disease)    bleeding PUD per patient, remote, On Daypro  . Urinary frequency 05/14/2014  . Uterine cancer ([place])   . Vaginal Pap smear, abnormal     Family History  Problem Relation Age of Onset  . Cirrhosis Father        etoh, died with liver cancer  . Diabetes Father   . Cirrhosis Paternal Uncle        multiple, etoh  . Pancreatic cancer Paternal Grandmother   . Diabetes Paternal Grandmother   . Kidney failure Mother   . Diabetes Mother   . Other Brother        crohn's  disease  . Hyperlipidemia Brother   . Fibromyalgia Daughter   . Diabetes Maternal Grandmother   . Healthy Son   . Colon cancer Maternal Uncle    Past Surgical History:  Procedure Laterality Date  . COLONOSCOPY WITH PROPOFOL N/A 11/22/2015   Dr.Rourk- the entire examined colon is normal, the examined portion of the ileum was normal, non-bleeding internal hemorrhoids. no specimens collected.   Marland Kitchen ECTOPIC PREGNANCY SURGERY     twice  . ESOPHAGEAL DILATION N/A  11/22/2015   Procedure: ESOPHAGEAL DILATION;  Surgeon: Daneil Dolin, MD;  [facility];  Service: Endoscopy;  Laterality: N/A;  . ESOPHAGOGASTRODUODENOSCOPY  12/01/2010   Dr. Gala Romney: mild distal ERE, antral erosions due to NSAIDS, no H.Pylori  . ESOPHAGOGASTRODUODENOSCOPY (EGD) WITH PROPOFOL N/A 11/22/2015   Dr.Rourk- grade A esophagitis, dilated. small hiatal hernia, normal stomach, normal second portion of the duodenum.  . hysterectomy for uterine cancer     partial   Social History   Social History Narrative  . Not on file    There is no immunization history on file for this patient.   Objective: Vital Signs: There were no vitals taken for this visit.   Physical Exam   Musculoskeletal Exam: ***  CDAI Exam: CDAI Score: - Patient Global: -; Provider Global: - Swollen: -; Tender: - Joint Exam   No joint exam has been documented for this visit   There is currently no information documented on the homunculus. Go to the Rheumatology activity and complete the homunculus joint exam.  Investigation: No additional findings.  Imaging: Xr Foot 2 Views Left  Result Date: 03/24/2019 PIP and DIP narrowing was noted.  No intertarsal tibiotalar joint space narrowing was noted.  No erosive changes were noted. Impression: These findings are consistent with osteoarthritis of the foot.  Xr Foot 2 Views Right  Result Date: 03/24/2019 First MTP, PIP and DIP narrowing was noted.  No MTP, intertarsal or tibiotalar joint space narrowing was noted.  No erosive changes were noted. Impression: These findings are consistent with osteoarthritis of the foot.  Xr Hand 2 View Left  Result Date: 03/24/2019 PIP and DIP narrowing was noted.  No MCP, intercarpal radiocarpal joint space narrowing was noted.  No erosive changes were noted. Impression: These findings are consistent with osteoarthritis of the hand.  Xr Hand 2 View Right  Result Date: 03/24/2019 PIP and DIP narrowing was noted.   No MCP, intercarpal radiocarpal joint space narrowing was noted.  No erosive changes were noted. Impression: These findings are consistent with osteoarthritis of the hand.  Xr Knee 3 View Left  Result Date: 03/24/2019 No significant medial lateral compartment narrowing was noted.  Mild patellofemoral narrowing was noted.  No chondrocalcinosis was noted. Impression: These findings are consistent with mild chondromalacia patella of the knee.  Xr Knee 3 View Right  Result Date: 03/24/2019 Mild medial compartment narrowing was noted.  Mild patellofemoral narrowing was noted.  No chondrocalcinosis was noted. Impression: These findings are consistent mild osteoarthritis of the knee joint and mild chondromalacia patella.  Xr Pelvis 1-2 Views  Result Date: 03/24/2019 No SI joint sclerosis or narrowing was noted.  No hip joint narrowing was noted. Impression: Unremarkable x-ray of the SI joints.   Recent Labs: Lab Results  Component Value Date   WBC 7.7 04/19/2017   HGB 13.8 04/19/2017   PLT 239 04/19/2017   NA 144 04/19/2017   K 4.3 04/19/2017   CL 105 04/19/2017   CO2 22 04/19/2017  GLUCOSE 58 (L) 04/19/2017   BUN 11 04/19/2017   CREATININE 0.75 04/19/2017   BILITOT 0.2 04/19/2017   ALKPHOS 74 04/19/2017   AST 21 04/19/2017   ALT 17 04/19/2017   PROT 6.9 04/19/2017   ALBUMIN 4.5 04/19/2017   CALCIUM 9.5 04/19/2017   GFRAA 104 04/19/2017  March 24, 2019 RF negative, anti-CCP negative, ANA negative, ENA negative except RNP 1.9, C3-C4 normal, uric acid 4.9  Speciality Comments: No specialty comments available.  Procedures:  No procedures performed Allergies: Cymbalta [duloxetine hcl], Daypro [oxaprozin], Sulfa antibiotics, Trazodone and nefazodone, and Nucynta [tapentadol]   Assessment / Plan:     Visit Diagnoses: No diagnosis found.  Orders: No orders of the defined types were placed in this encounter.  No orders of the defined types were placed in this encounter.    Face-to-face time spent with patient was *** minutes. Greater than 50% of time was spent in counseling and coordination of care.  Follow-Up Instructions: No follow-ups on file.   Bo Merino, MD  Note - This record has been created using Editor, commissioning.  Chart creation errors have been sought, but may not always  have been located. Such creation errors do not reflect on  the standard of medical care.

## 2019-04-28 ENCOUNTER — Ambulatory Visit: Payer: Medicare Other | Admitting: Rheumatology

## 2019-05-13 ENCOUNTER — Telehealth: Payer: Self-pay | Admitting: *Deleted

## 2019-05-13 NOTE — Telephone Encounter (Signed)
Pt left message that she has a question about the colposcopy and her next appt.

## 2019-05-13 NOTE — Telephone Encounter (Signed)
Spoke with pt. Pt has her pap scheduled with [facility] in Jan. Susan Baker

## 2019-05-14 ENCOUNTER — Other Ambulatory Visit: Payer: Medicare Other | Admitting: Obstetrics and Gynecology

## 2019-07-16 ENCOUNTER — Encounter: Payer: Self-pay | Admitting: *Deleted

## 2019-07-16 ENCOUNTER — Other Ambulatory Visit: Payer: Self-pay | Admitting: Gastroenterology

## 2019-07-16 ENCOUNTER — Ambulatory Visit (INDEPENDENT_AMBULATORY_CARE_PROVIDER_SITE_OTHER): Payer: Medicare Other | Admitting: Gastroenterology

## 2019-07-16 ENCOUNTER — Encounter: Payer: Self-pay | Admitting: Gastroenterology

## 2019-07-16 ENCOUNTER — Other Ambulatory Visit: Payer: Self-pay

## 2019-07-16 VITALS — BP 113/69 | HR 86 | Temp 96.9°F | Ht 63.0 in | Wt 152.2 lb

## 2019-07-16 DIAGNOSIS — R1011 Right upper quadrant pain: Secondary | ICD-10-CM

## 2019-07-16 MED ORDER — PANTOPRAZOLE SODIUM 40 MG PO TBEC: 40.0000 mg | DELAYED_RELEASE_TABLET | Freq: Every day | ORAL | 3 refills | Status: DC

## 2019-07-16 NOTE — Patient Instructions (Signed)
I have sent in Protonix for you to take once each morning, 30 minutes before breakfast.   Please reach out to your cardiologist for an appointment as well to ensure we are covering all the bases.  Please complete blood work today; we will call with results.  I have also ordered an ultrasound to check your liver.  Limit Ibuprofen use, as this can cause damage to your GI tract.  Further recommendations to follow!  I enjoyed seeing you again today! As you know, I value our relationship and want to provide genuine, compassionate, and quality care. I welcome your feedback. If you receive a survey regarding your visit,  I greatly appreciate you taking time to fill this out. See you next time!  Annitta Needs, PhD, ANP-BC [facility]

## 2019-07-16 NOTE — Progress Notes (Signed)
Referring Provider: Jani Gravel, MD Primary Care Physician:  Jani Gravel, MD  Primary GI: Dr. Gala Romney   Chief Complaint  Patient presents with  . Abdominal Pain    ruq, occ mid upper abd  . Chest Pain    left side x 3 weeks    HPI:   Susan Baker is a 59 y.o. female presenting today with a history of abdominal pain, vomiting, and diarrhea. Last seen in 2017.  Extensive evaluation in 2012 with ultrasound, HIDA, MRI. She did note mild abdominal pain with CCK infusion during HIDA but EF was 82%, which is normal. EGD/colonoscopy in 2017: normal colon, normal TI, non-bleeding hemorrhoids. LA grade A esophagitis, s/p dilation, small hiatal hernia, normal stomach and duodenum.   Father and multiple uncles with cirrhosis. Pain constant in RUQ, worsening over past few months. Feels like radiates to her back. Last 3 weeks with left-sided chest discomfort, LUQ pain radiating up into left chest. Last few days feels moving across chest to right sides. Sometimes with bending forward or backwards, will worsen symptoms. Can last for hours like a dull aching pain and sometimes comes on like a sharp, pain with certain movements. Sometimes laying down at night will feel the discomfort. Feels like she has heart palpitations but wonders if related to anxiety. Not on a PPI currently.   Omeprazole made her sick on her stomach. Trying not to take Ibuprofen. Chronic back pain. Bulging discs.   Past Medical History:  Diagnosis Date  . Abnormal Pap smear   . Abnormal ThinPrep Pap test of vagina 04/25/2017   LSIL will get colp__________  . Anxiety   . Bulging disc    cervical[facility]  . Depression 02/18/2013  . Fibromyalgia   . GERD (gastroesophageal reflux disease)    egd with RE per patient, remote  . Hematuria 05/14/2014  . History of abnormal cervical Pap smear 02/24/2014  . Osteoarthritis (arthritis due to wear and tear of joints)   . Osteoporosis   . Osteoporosis, unspecified 03/03/2014  .  Postcoital bleeding 09/15/2015  . Postmenopausal 02/24/2014  . PUD (peptic ulcer disease)    bleeding PUD per patient, remote, On Daypro  . Urinary frequency 05/14/2014  . Uterine cancer ([place])   . Vaginal Pap smear, abnormal     Past Surgical History:  Procedure Laterality Date  . COLONOSCOPY WITH PROPOFOL N/A 11/22/2015   Dr.Rourk- the entire examined colon is normal, the examined portion of the ileum was normal, non-bleeding internal hemorrhoids. no specimens collected.   Marland Kitchen ECTOPIC PREGNANCY SURGERY     twice  . ESOPHAGEAL DILATION N/A 11/22/2015   Procedure: ESOPHAGEAL DILATION;  Surgeon: Daneil Dolin, MD;  [facility];  Service: Endoscopy;  Laterality: N/A;  . ESOPHAGOGASTRODUODENOSCOPY  12/01/2010   Dr. Gala Romney: mild distal ERE, antral erosions due to NSAIDS, no H.Pylori  . ESOPHAGOGASTRODUODENOSCOPY (EGD) WITH PROPOFOL N/A 11/22/2015   Dr.Rourk- grade A esophagitis, dilated. small hiatal hernia, normal stomach, normal second portion of the duodenum.  . hysterectomy for uterine cancer     partial    Current Outpatient Medications  Medication Sig Dispense Refill  . FLUoxetine (PROZAC) 40 MG capsule Take 40 mg by mouth 2 (two) times daily.     Marland Kitchen ibuprofen (ADVIL,MOTRIN) 200 MG tablet Take 200 mg by mouth as needed.     . Oxycodone HCl 10 MG TABS Take 10 mg by mouth 4 (four) times daily.  0  . pregabalin (LYRICA)  150 MG capsule Take 150 mg by mouth 2 (two) times daily.    . QUEtiapine (SEROQUEL) 25 MG tablet 25 mg as needed.   3  . zolpidem (AMBIEN CR) 12.5 MG CR tablet 12.5 mg at bedtime as needed.     Marland Kitchen estradiol (ESTRACE VAGINAL) 0.1 MG/GM vaginal cream 0.5 g 3 times weekly (Patient not taking: Reported on 07/16/2019) 42.5 g 1  . pregabalin (LYRICA) 75 MG capsule Take 150 mg by mouth 2 (two) times daily.      No current facility-administered medications for this visit.    Allergies as of 07/16/2019 - Review Complete 07/16/2019  Allergen Reaction Noted  . Cymbalta  [duloxetine hcl] Other (See Comments) 02/22/2012  . Daypro [oxaprozin] Other (See Comments) 11/07/2010  . Sulfa antibiotics Other (See Comments) 02/22/2012  . Trazodone and nefazodone Other (See Comments) 02/22/2012  . Nucynta [tapentadol] Other (See Comments) 08/18/2015    Family History  Problem Relation Age of Onset  . Cirrhosis Father        etoh, died with liver cancer  . Diabetes Father   . Cirrhosis Paternal Uncle        multiple, etoh  . Pancreatic cancer Paternal Grandmother   . Diabetes Paternal Grandmother   . Kidney failure Mother   . Diabetes Mother   . Other Brother        crohn's disease  . Hyperlipidemia Brother   . Fibromyalgia Daughter   . Diabetes Maternal Grandmother   . Healthy Son   . Colon cancer Maternal Uncle     Social History   Socioeconomic History  . Marital status: Divorced    Spouse name: Not on file  . Number of children: 2  . Years of education: Not on file  . Highest education level: Not on file  Occupational History  . Occupation: disability  Tobacco Use  . Smoking status: Current Some Day Smoker    Packs/day: 0.50    Years: 31.00    Pack years: 15.50    Types: E-cigarettes  . Smokeless tobacco: Never Used  Substance and Sexual Activity  . Alcohol use: Yes    Alcohol/week: 0.0 standard drinks    Comment: occasional social use   . Drug use: No  . Sexual activity: Not Currently    Birth control/protection: Surgical    Comment: hysterectomy  Other Topics Concern  . Not on file  Social History Narrative  . Not on file   Social Determinants of Health   Financial Resource Strain:   . Difficulty of Paying Living Expenses: Not on file  Food Insecurity:   . Worried About Charity fundraiser in the Last Year: Not on file  . Ran Out of Food in the Last Year: Not on file  Transportation Needs:   . Lack of Transportation (Medical): Not on file  . Lack of Transportation (Non-Medical): Not on file  Physical Activity:   . Days of  Exercise per Week: Not on file  . Minutes of Exercise per Session: Not on file  Stress:   . Feeling of Stress : Not on file  Social Connections:   . Frequency of Communication with Friends and Family: Not on file  . Frequency of Social Gatherings with Friends and Family: Not on file  . Attends Religious Services: Not on file  . Active Member of Clubs or Organizations: Not on file  . Attends Archivist Meetings: Not on file  . Marital Status: Not on file  Review of Systems: Gen: Denies fever, chills, anorexia. Denies fatigue, weakness, weight loss.  CV: Denies chest pain, palpitations, syncope, peripheral edema, and claudication. Resp: wheezing at night  GI: see HPI Derm: Denies rash, itching, dry skin Psych: Denies depression, anxiety, memory loss, confusion. No homicidal or suicidal ideation.  Heme: Denies bruising, bleeding, and enlarged lymph nodes.  Physical Exam: BP 113/69   Pulse 86   Temp (!) 96.9 F (36.1 C) (Temporal)   Ht 5\' 3"  (1.6 m)   Wt 152 lb 3.2 oz (69 kg)   BMI 26.96 kg/m  General:   Alert and oriented. No distress noted. Pleasant and cooperative.  Head:  Normocephalic and atraumatic. Eyes:  Conjuctiva clear without scleral icterus. Abdomen:  +BS, soft, non-tender and non-distended. No rebound or guarding. No HSM or masses noted. Msk:  Symmetrical without gross deformities. Normal posture. Extremities:  Without edema. Neurologic:  Alert and  oriented x4 Psych:  Alert and cooperative. Normal mood and affect.  ASSESSMENT: AKESHIA MINCEY is a 59 y.o. female presenting today with history of chronic RUQ abdominal pain, gallbladder present, now worsening over past few months. Most recently, with LUQ pain and left-sided chest discomfort; currently not on a PPI.   Will proceed with labs, US abdomen complete, start Protonix daily, and patient will be contacting cardiologist to ensure no cardiac etiology. Anticipate GERD-related symptoms.    PLAN:    US abdomen complete  CBC, CMP, lipase  Start Protonix once daily  Limit NSAIDs  Follow-up with cardiology  Further recommendations to follow  Annitta Needs, PhD, ANP-BC [facility]

## 2019-07-17 LAB — CBC/DIFF AMBIGUOUS DEFAULT
Basophils Absolute: 0 10*3/uL (ref 0.0–0.2)
Basos: 1 %
EOS (ABSOLUTE): 0.3 10*3/uL (ref 0.0–0.4)
Eos: 4 %
Hematocrit: 42 % (ref 34.0–46.6)
Hemoglobin: 14 g/dL (ref 11.1–15.9)
Immature Grans (Abs): 0 10*3/uL (ref 0.0–0.1)
Immature Granulocytes: 0 %
Lymphocytes Absolute: 2.6 10*3/uL (ref 0.7–3.1)
Lymphs: 40 %
MCH: 31 pg (ref 26.6–33.0)
MCHC: 33.3 g/dL (ref 31.5–35.7)
MCV: 93 fL (ref 79–97)
Monocytes Absolute: 0.5 10*3/uL (ref 0.1–0.9)
Monocytes: 8 %
Neutrophils Absolute: 3 10*3/uL (ref 1.4–7.0)
Neutrophils: 47 %
Platelets: 229 10*3/uL (ref 150–450)
RBC: 4.52 x10E6/uL (ref 3.77–5.28)
RDW: 12.9 % (ref 11.7–15.4)
WBC: 6.4 10*3/uL (ref 3.4–10.8)

## 2019-07-17 LAB — LIPASE: Lipase: 27 U/L (ref 14–72)

## 2019-07-18 ENCOUNTER — Telehealth: Payer: Self-pay

## 2019-07-18 NOTE — Telephone Encounter (Signed)
Received lab results from [facility] for CBC and Lipase. Placing in box for Roseanne Kaufman, NP to review.

## 2019-07-18 NOTE — Telephone Encounter (Addendum)
CBC normal with Hgb 14.0, Hct 42, platelets 229, lipase 27. CMP not included but had been ordered.   Doris: Can we find out status of CMP? If they don't have, please have [facility] add to blood in lab.

## 2019-07-18 NOTE — Telephone Encounter (Signed)
I called [facility] and spoke with Sherrell. The CMP has been added.

## 2019-07-21 NOTE — Progress Notes (Signed)
Cc'ed to pcp °

## 2019-07-22 ENCOUNTER — Ambulatory Visit (HOSPITAL_COMMUNITY): Payer: Medicare Other

## 2019-07-23 ENCOUNTER — Ambulatory Visit (HOSPITAL_COMMUNITY): Admission: RE | Admit: 2019-07-23 | Payer: Medicare Other | Source: Ambulatory Visit

## 2019-07-23 LAB — COMPREHENSIVE METABOLIC PANEL
ALT: 16 IU/L (ref 0–32)
AST: 20 IU/L (ref 0–40)
Albumin/Globulin Ratio: 2.2 (ref 1.2–2.2)
Albumin: 4.6 g/dL (ref 3.8–4.9)
Alkaline Phosphatase: 76 IU/L (ref 39–117)
BUN/Creatinine Ratio: 12 (ref 9–23)
BUN: 10 mg/dL (ref 6–24)
Bilirubin Total: <0.2 mg/dL (ref 0.0–1.2)
CO2: 22 mmol/L (ref 20–29)
Calcium: 9.8 mg/dL (ref 8.7–10.2)
Chloride: 101 mmol/L (ref 96–106)
Creatinine, Ser: 0.86 mg/dL (ref 0.57–1.00)
GFR calc Af Amer: 87 mL/min/{1.73_m2} (ref >59)
GFR calc non Af Amer: 75 mL/min/{1.73_m2} (ref >59)
Globulin, Total: 2.1 g/dL (ref 1.5–4.5)
Glucose: 89 mg/dL (ref 65–99)
Potassium: 4.6 mmol/L (ref 3.5–5.2)
Sodium: 140 mmol/L (ref 134–144)
Total Protein: 6.7 g/dL (ref 6.0–8.5)

## 2019-07-23 LAB — SPECIMEN STATUS REPORT

## 2019-07-25 ENCOUNTER — Other Ambulatory Visit: Payer: Medicare Other | Admitting: Adult Health

## 2019-08-01 ENCOUNTER — Ambulatory Visit (HOSPITAL_COMMUNITY): Admission: RE | Admit: 2019-08-01 | Payer: Medicare Other | Source: Ambulatory Visit

## 2019-08-29 NOTE — Progress Notes (Deleted)
Office Visit Note  Patient: Susan Baker             Date of Birth: 12-07-1960           MRN: [id number]             PCP: Jani Gravel, MD Referring: Jani Gravel, MD Visit Date: 09/03/2019 Occupation: @GUAROCC @  Subjective:  No chief complaint on file.   History of Present Illness: Susan Baker is a 59 y.o. female ***   Activities of Daily Living:  Patient reports morning stiffness for *** {minute/hour:19697}.   Patient {ACTIONS;DENIES/REPORTS:21021675::"Denies"} nocturnal pain.  Difficulty dressing/grooming: {ACTIONS;DENIES/REPORTS:21021675::"Denies"} Difficulty climbing stairs: {ACTIONS;DENIES/REPORTS:21021675::"Denies"} Difficulty getting out of chair: {ACTIONS;DENIES/REPORTS:21021675::"Denies"} Difficulty using hands for taps, buttons, cutlery, and/or writing: {ACTIONS;DENIES/REPORTS:21021675::"Denies"}  No Rheumatology ROS completed.   PMFS History:  Patient Active Problem List   Diagnosis Date Noted  . Enlarged lymph node 09/02/2018  . Screening for colorectal cancer 09/02/2018  . Encounter for well woman exam with routine gynecological exam 09/02/2018  . Vaginal Pap smear following hysterectomy for malignancy 09/02/2018  . Abnormal ThinPrep Pap test of vagina 04/25/2017  . Fatigue 04/19/2017  . Well woman exam with routine gynecological exam 04/19/2017  . 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 Hematochezia   . Third degree hemorrhoids   . Hiatal hernia   . Dysphagia 11/02/2015  . Lymphadenopathy 11/02/2015  . Rectal bleeding 11/02/2015  . Postcoital bleeding 09/15/2015  . Urinary frequency 05/14/2014  . Hematuria 05/14/2014  . Abnormal Pap smear of vagina 03/04/2014  . Osteoporosis 03/03/2014  . History of abnormal cervical Pap smear 02/24/2014  . Postmenopausal 02/24/2014  . 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 Fibromyalgia 02/18/2013  . Depression 02/18/2013  . Osteoarthritis (arthritis due  to wear and tear of joints) 02/18/2013  . Bulging disc 02/18/2013  . Nausea with vomiting 11/13/2012  . Reflux esophagitis 12/12/2010  . Gastric erosions 12/12/2010  . RUQ abdominal pain 11/07/2010  . Nausea 11/07/2010  . Pancreatic lesion 11/07/2010  . Fatty liver 11/07/2010  . Family history of cirrhosis of liver 11/07/2010  . Kidney cysts 11/07/2010    Past Medical History:  Diagnosis Date  . Abnormal Pap smear   . Abnormal ThinPrep Pap test of vagina 04/25/2017   LSIL will get colp__________  . Anxiety   . Bulging disc    cervical, [facility]  . Depression 02/18/2013  . Fibromyalgia   . GERD (gastroesophageal reflux disease)    egd with RE per patient, remote  . Hematuria 05/14/2014  . History of abnormal cervical Pap smear 02/24/2014  . Osteoarthritis (arthritis due to wear and tear of joints)   . Osteoporosis   . Osteoporosis, unspecified 03/03/2014  . Postcoital bleeding 09/15/2015  . Postmenopausal 02/24/2014  . PUD (peptic ulcer disease)    bleeding PUD per patient, remote, On Daypro  . Urinary frequency 05/14/2014  . Uterine cancer ([place])   . Vaginal Pap smear, abnormal     Family History  Problem Relation Age of Onset  . Cirrhosis Father        etoh, died with liver cancer  . Diabetes Father   . Cirrhosis Paternal Uncle        multiple, etoh  . Pancreatic cancer Paternal Grandmother   . Diabetes Paternal Grandmother   . Kidney failure Mother   . Diabetes Mother   . Other Brother        crohn's  disease  . Hyperlipidemia Brother   . Fibromyalgia Daughter   . Diabetes Maternal Grandmother   . Healthy Son   . Colon cancer Maternal Uncle    Past Surgical History:  Procedure Laterality Date  . COLONOSCOPY WITH PROPOFOL N/A 11/22/2015   Dr.Rourk- the entire examined colon is normal, the examined portion of the ileum was normal, non-bleeding internal hemorrhoids. no specimens collected.   Marland Kitchen ECTOPIC PREGNANCY SURGERY     twice  . ESOPHAGEAL DILATION N/A  11/22/2015   Procedure: ESOPHAGEAL DILATION;  Surgeon: Daneil Dolin, MD;  [facility];  Service: Endoscopy;  Laterality: N/A;  . ESOPHAGOGASTRODUODENOSCOPY  12/01/2010   Dr. Gala Romney: mild distal ERE, antral erosions due to NSAIDS, no H.Pylori  . ESOPHAGOGASTRODUODENOSCOPY (EGD) WITH PROPOFOL N/A 11/22/2015   Dr.Rourk- grade A esophagitis, dilated. small hiatal hernia, normal stomach, normal second portion of the duodenum.  . hysterectomy for uterine cancer     partial   Social History   Social History Narrative  . Not on file    There is no immunization history on file for this patient.   Objective: Vital Signs: There were no vitals taken for this visit.   Physical Exam   Musculoskeletal Exam: ***  CDAI Exam: CDAI Score: -- Patient Global: --; Provider Global: -- Swollen: --; Tender: -- Joint Exam 09/03/2019   No joint exam has been documented for this visit   There is currently no information documented on the homunculus. Go to the Rheumatology activity and complete the homunculus joint exam.  Investigation: No additional findings.  Imaging: No results found.  Recent Labs: Lab Results  Component Value Date   WBC 6.4 07/16/2019   HGB 14.0 07/16/2019   PLT 229 07/16/2019   NA 140 07/16/2019   K 4.6 07/16/2019   CL 101 07/16/2019   CO2 22 07/16/2019   GLUCOSE 89 07/16/2019   BUN 10 07/16/2019   CREATININE 0.86 07/16/2019   BILITOT <0.2 07/16/2019   ALKPHOS 76 07/16/2019   AST 20 07/16/2019   ALT 16 07/16/2019   PROT 6.7 07/16/2019   ALBUMIN 4.6 07/16/2019   CALCIUM 9.8 07/16/2019   GFRAA 87 07/16/2019  March 24, 2019 ANA negative, ENA negative except RNP 1.9, C3-C4 normal, RF negative, anti-CCP negative, uric acid 4.9  Speciality Comments: No specialty comments available.  Procedures:  No procedures performed Allergies: Cymbalta [duloxetine hcl], Daypro [oxaprozin], Sulfa antibiotics, Trazodone and nefazodone, and Nucynta [tapentadol]    Assessment / Plan:     Visit Diagnoses: No diagnosis found.  Orders: No orders of the defined types were placed in this encounter.  No orders of the defined types were placed in this encounter.   Face-to-face time spent with patient was *** minutes. Greater than 50% of time was spent in counseling and coordination of care.  Follow-Up Instructions: No follow-ups on file.   Bo Merino, MD  Note - This record has been created using Editor, commissioning.  Chart creation errors have been sought, but may not always  have been located. Such creation errors do not reflect on  the standard of medical care.

## 2019-09-03 ENCOUNTER — Ambulatory Visit: Payer: Medicare Other | Admitting: Rheumatology

## 2019-09-15 ENCOUNTER — Other Ambulatory Visit: Payer: Medicare Other | Admitting: Adult Health

## 2019-09-24 ENCOUNTER — Other Ambulatory Visit (HOSPITAL_COMMUNITY): Payer: Self-pay | Admitting: Adult Health

## 2019-09-24 DIAGNOSIS — Z1231 Encounter for screening mammogram for malignant neoplasm of breast: Secondary | ICD-10-CM

## 2019-09-26 NOTE — Progress Notes (Signed)
Office Visit Note  Patient: Susan Baker             Date of Birth: 1960/10/23           MRN: [id number]             PCP: Jani Gravel, MD Referring: Jani Gravel, MD Visit Date: 09/29/2019 Occupation: @GUAROCC @  Subjective:  Pain in multiple joints.   History of Present Illness: Susan Baker is a 59 y.o. female with history of osteoarthritis, facet joint arthropathy and fibromyalgia.  She says she continues to have severe pain and discomfort in her joints.  She has been going to pain management for fibromyalgia.  She has history of gastric ulcers and erosions.  She is having another GI work-up.  She states she has to take ibuprofen for migraine headaches.  She has not seen a neurologist in a long time.  She would like a referral to a neurologist.  She denies any history of oral ulcers, nasal ulcers, malar rash, photosensitivity, Raynaud's phenomenon or shortness of breath.  Activities of Daily Living:  Patient reports morning stiffness for 2-3 hours.   Patient Reports nocturnal pain.  Difficulty dressing/grooming: Denies Difficulty climbing stairs: Denies Difficulty getting out of chair: Denies Difficulty using hands for taps, buttons, cutlery, and/or writing: Reports  Review of Systems  Constitutional: Positive for fatigue. Negative for night sweats, weight gain and weight loss.  HENT: Positive for mouth dryness and nose dryness. Negative for mouth sores, trouble swallowing and trouble swallowing.   Eyes: Positive for dryness. Negative for pain, redness and visual disturbance.  Respiratory: Negative for cough, shortness of breath and difficulty breathing.   Cardiovascular: Negative for chest pain, palpitations, hypertension, irregular heartbeat and swelling in legs/feet.  Gastrointestinal: Negative for blood in stool, constipation and diarrhea.  Endocrine: Negative for increased urination.  Genitourinary: Negative for difficulty urinating, painful urination and vaginal dryness.   Musculoskeletal: Positive for arthralgias, joint pain, myalgias, morning stiffness, muscle tenderness and myalgias. Negative for joint swelling and muscle weakness.  Skin: Positive for rash. Negative for color change, hair loss, redness, skin tightness, ulcers and sensitivity to sunlight.  Allergic/Immunologic: Negative for susceptible to infections.  Neurological: Positive for numbness, headaches, memory loss and weakness. Negative for dizziness and night sweats.  Hematological: Negative for bruising/bleeding tendency and swollen glands.  Psychiatric/Behavioral: Positive for confusion. Negative for depressed mood and sleep disturbance. The patient is not nervous/anxious.     PMFS History:  Patient Active Problem List   Diagnosis Date Noted  . Enlarged lymph node 09/02/2018  . Screening for colorectal cancer 09/02/2018  . Encounter for well woman exam with routine gynecological exam 09/02/2018  . Vaginal Pap smear following hysterectomy for malignancy 09/02/2018  . Abnormal ThinPrep Pap test of vagina 04/25/2017  . Fatigue 04/19/2017  . Well woman exam with routine gynecological exam 04/19/2017  . 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 Hematochezia   . Third degree hemorrhoids   . Hiatal hernia   . Dysphagia 11/02/2015  . Lymphadenopathy 11/02/2015  . Rectal bleeding 11/02/2015  . Postcoital bleeding 09/15/2015  . Urinary frequency 05/14/2014  . Hematuria 05/14/2014  . Abnormal Pap smear of vagina 03/04/2014  . Osteoporosis 03/03/2014  . History of abnormal cervical Pap smear 02/24/2014  . Postmenopausal 02/24/2014  . 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 Fibromyalgia 02/18/2013  . Depression 02/18/2013  . Osteoarthritis (arthritis due to wear and tear  of joints) 02/18/2013  . Bulging disc 02/18/2013  . Nausea with vomiting 11/13/2012  . Reflux esophagitis 12/12/2010  . Gastric erosions 12/12/2010  . RUQ abdominal  pain 11/07/2010  . Nausea 11/07/2010  . Pancreatic lesion 11/07/2010  . Fatty liver 11/07/2010  . Family history of cirrhosis of liver 11/07/2010  . Kidney cysts 11/07/2010    Past Medical History:  Diagnosis Date  . Abnormal Pap smear   . Abnormal ThinPrep Pap test of vagina 04/25/2017   LSIL will get colp__________  . Anxiety   . Bulging disc    cervical, [facility]  . Depression 02/18/2013  . Fibromyalgia   . GERD (gastroesophageal reflux disease)    egd with RE per patient, remote  . Hematuria 05/14/2014  . History of abnormal cervical Pap smear 02/24/2014  . Osteoarthritis (arthritis due to wear and tear of joints)   . Osteoporosis   . Osteoporosis, unspecified 03/03/2014  . Postcoital bleeding 09/15/2015  . Postmenopausal 02/24/2014  . PUD (peptic ulcer disease)    bleeding PUD per patient, remote, On Daypro  . Urinary frequency 05/14/2014  . Uterine cancer ([place])   . Vaginal Pap smear, abnormal     Family History  Problem Relation Age of Onset  . Cirrhosis Father        etoh, died with liver cancer  . Diabetes Father   . Cirrhosis Paternal Uncle        multiple, etoh  . Pancreatic cancer Paternal Grandmother   . Diabetes Paternal Grandmother   . Kidney failure Mother   . Diabetes Mother   . Other Brother        crohn's disease  . Hyperlipidemia Brother   . Fibromyalgia Daughter   . Diabetes Maternal Grandmother   . Healthy Son   . Colon cancer Maternal Uncle    Past Surgical History:  Procedure Laterality Date  . COLONOSCOPY WITH PROPOFOL N/A 11/22/2015   Dr.Rourk- the entire examined colon is normal, the examined portion of the ileum was normal, non-bleeding internal hemorrhoids. no specimens collected.   Marland Kitchen ECTOPIC PREGNANCY SURGERY     twice  . ESOPHAGEAL DILATION N/A 11/22/2015   Procedure: ESOPHAGEAL DILATION;  Surgeon: Daneil Dolin, MD;  Location: [facility];  Service: Endoscopy;  Laterality: N/A;  . ESOPHAGOGASTRODUODENOSCOPY  12/01/2010   Dr.  Gala Romney: mild distal ERE, antral erosions due to NSAIDS, no H.Pylori  . ESOPHAGOGASTRODUODENOSCOPY (EGD) WITH PROPOFOL N/A 11/22/2015   Dr.Rourk- grade A esophagitis, dilated. small hiatal hernia, normal stomach, normal second portion of the duodenum.  . hysterectomy for uterine cancer     partial   Social History   Social History Narrative  . Not on file    There is no immunization history on file for this patient.   Objective: Vital Signs: BP 124/85 (BP Location: Left Arm, Patient Position: Sitting, Cuff Size: Normal)   Pulse 93   Resp 14   Ht 5' 2.5" (1.588 m)   Wt 155 lb 9.6 oz (70.6 kg)   BMI 28.01 kg/m    Physical Exam Vitals and nursing note reviewed.  Constitutional:      Appearance: She is well-developed.  HENT:     Head: Normocephalic and atraumatic.  Eyes:     Conjunctiva/sclera: Conjunctivae normal.  Cardiovascular:     Rate and Rhythm: Normal rate and regular rhythm.     Heart sounds: Normal heart sounds.  Pulmonary:     Effort: Pulmonary effort is normal.  Breath sounds: Normal breath sounds.  Abdominal:     General: Bowel sounds are normal.     Palpations: Abdomen is soft.  Musculoskeletal:     Cervical back: Normal range of motion.  Lymphadenopathy:     Cervical: No cervical adenopathy.  Skin:    General: Skin is warm and dry.     Capillary Refill: Capillary refill takes less than 2 seconds.  Neurological:     Mental Status: She is alert and oriented to person, place, and time.  Psychiatric:        Behavior: Behavior normal.      Musculoskeletal Exam: C-spine was in good range of motion.  She has some discomfort range of motion of her lumbar spine.  Shoulder joints, elbow joints, wrist joints with good range of motion.  She has some DIP and PIP thickening in her hands.  She has crepitus in her knee joints without any warmth swelling or effusion.  She has some DIP and PIP prominence in her feet with no synovitis.  CDAI Exam: CDAI Score:  -- Patient Global: --; Provider Global: -- Swollen: --; Tender: -- Joint Exam 09/29/2019   No joint exam has been documented for this visit   There is currently no information documented on the homunculus. Go to the Rheumatology activity and complete the homunculus joint exam.  Investigation: No additional findings.  Imaging: No results found.  Recent Labs: Lab Results  Component Value Date   WBC 6.4 07/16/2019   HGB 14.0 07/16/2019   PLT 229 07/16/2019   NA 140 07/16/2019   K 4.6 07/16/2019   CL 101 07/16/2019   CO2 22 07/16/2019   GLUCOSE 89 07/16/2019   BUN 10 07/16/2019   CREATININE 0.86 07/16/2019   BILITOT <0.2 07/16/2019   ALKPHOS 76 07/16/2019   AST 20 07/16/2019   ALT 16 07/16/2019   PROT 6.7 07/16/2019   ALBUMIN 4.6 07/16/2019   CALCIUM 9.8 07/16/2019   GFRAA 87 07/16/2019  March 24, 2019 RF negative, anti-CCP negative, ANA negative, ENA negative except RNP positive, C3-C4 normal, uric acid 4.9  Speciality Comments: No specialty comments available.  Procedures:  No procedures performed Allergies: Cymbalta [duloxetine hcl], Daypro [oxaprozin], Sulfa antibiotics, Trazodone and nefazodone, and Nucynta [tapentadol]   Assessment / Plan:     Visit Diagnoses: Primary osteoarthritis of both hands-patient has been experiencing some pain and discomfort in her bilateral hands.  Joint protection muscle strengthening was discussed.  Primary osteoarthritis of both knees - Mild osteoarthritis of the right knee joint and bilateral mild chondromalacia patella.  Patient has been experiencing some discomfort in her knee joints.  Joint protection muscle strengthening was discussed.  A handout on knee exercises was given.  Primary osteoarthritis of both feet-she has been having stiffness in her feet.  Arthropathy of lumbar facet joint-she has chronic discomfort in her lower back.  A handout on back exercises was given.  Psoriasis-she uses topical agents.  Positive ANA  (antinuclear antibody) -she has no clinical features of autoimmune disease.  I will repeat ANA negative, RNP positive - Plan: RNP Antibody, ANA  Osteoporosis, unspecified osteoporosis type, unspecified pathological fracture presence-she had last bone density in 2015.  We will repeat DEXA scan.  Fibromyalgia-she continues to have generalized pain and is going to pain management.  History of migraine headaches-I will refer her to neurologist per her request.  Gastroesophageal reflux disease with esophagitis without hemorrhage  Hiatal hernia  Chronic gastric erosion  Pancreatic lesion  Fatty liver  Kidney cysts  History of carcinoma in situ of cervix  Family history of cirrhosis of liver  Smoker  Orders: Orders Placed This Encounter  Procedures  . DG BONE DENSITY (DXA)  . RNP Antibody  . ANA  . Ambulatory referral to Neurology   No orders of the defined types were placed in this encounter.   Face-to-face time spent with patient was 30 minutes. Greater than 50% of time was spent in counseling and coordination of care.  Follow-Up Instructions: Return in about 2 months (around 11/29/2019) for OA, FMS,OP.   Bo Merino, MD  Note - This record has been created using Editor, commissioning.  Chart creation errors have been sought, but may not always  have been located. Such creation errors do not reflect on  the standard of medical care.

## 2019-09-29 ENCOUNTER — Encounter: Payer: Self-pay | Admitting: Rheumatology

## 2019-09-29 ENCOUNTER — Other Ambulatory Visit: Payer: Self-pay

## 2019-09-29 ENCOUNTER — Ambulatory Visit (INDEPENDENT_AMBULATORY_CARE_PROVIDER_SITE_OTHER): Payer: Medicare Other | Admitting: Rheumatology

## 2019-09-29 VITALS — BP 124/85 | HR 93 | Resp 14 | Ht 62.5 in | Wt 155.6 lb

## 2019-09-29 DIAGNOSIS — M19041 Primary osteoarthritis, right hand: Secondary | ICD-10-CM

## 2019-09-29 DIAGNOSIS — Z8379 Family history of other diseases of the digestive system: Secondary | ICD-10-CM

## 2019-09-29 DIAGNOSIS — R768 Other specified abnormal immunological findings in serum: Secondary | ICD-10-CM

## 2019-09-29 DIAGNOSIS — K449 Diaphragmatic hernia without obstruction or gangrene: Secondary | ICD-10-CM

## 2019-09-29 DIAGNOSIS — N281 Cyst of kidney, acquired: Secondary | ICD-10-CM

## 2019-09-29 DIAGNOSIS — Z8669 Personal history of other diseases of the nervous system and sense organs: Secondary | ICD-10-CM

## 2019-09-29 DIAGNOSIS — M19042 Primary osteoarthritis, left hand: Secondary | ICD-10-CM

## 2019-09-29 DIAGNOSIS — M47816 Spondylosis without myelopathy or radiculopathy, lumbar region: Secondary | ICD-10-CM | POA: Diagnosis not present

## 2019-09-29 DIAGNOSIS — F17200 Nicotine dependence, unspecified, uncomplicated: Secondary | ICD-10-CM

## 2019-09-29 DIAGNOSIS — M170 Bilateral primary osteoarthritis of knee: Secondary | ICD-10-CM | POA: Diagnosis not present

## 2019-09-29 DIAGNOSIS — K2100 Gastro-esophageal reflux disease with esophagitis, without bleeding: Secondary | ICD-10-CM

## 2019-09-29 DIAGNOSIS — M797 Fibromyalgia: Secondary | ICD-10-CM

## 2019-09-29 DIAGNOSIS — M19071 Primary osteoarthritis, right ankle and foot: Secondary | ICD-10-CM | POA: Diagnosis not present

## 2019-09-29 DIAGNOSIS — K869 Disease of pancreas, unspecified: Secondary | ICD-10-CM

## 2019-09-29 DIAGNOSIS — K760 Fatty (change of) liver, not elsewhere classified: Secondary | ICD-10-CM

## 2019-09-29 DIAGNOSIS — M19072 Primary osteoarthritis, left ankle and foot: Secondary | ICD-10-CM

## 2019-09-29 DIAGNOSIS — L409 Psoriasis, unspecified: Secondary | ICD-10-CM

## 2019-09-29 DIAGNOSIS — M810 Age-related osteoporosis without current pathological fracture: Secondary | ICD-10-CM

## 2019-09-29 DIAGNOSIS — K257 Chronic gastric ulcer without hemorrhage or perforation: Secondary | ICD-10-CM

## 2019-09-29 DIAGNOSIS — Z86001 Personal history of in-situ neoplasm of cervix uteri: Secondary | ICD-10-CM

## 2019-09-29 NOTE — Patient Instructions (Signed)
Journal for Nurse Practitioners, 15(4), 263-267. Retrieved April 08, 2018 from [email or web address].com/[email or web address]">  Knee Exercises Ask your health care provider which exercises are safe for you. Do exercises exactly as told by your health care provider and adjust them as directed. It is normal to feel mild stretching, pulling, tightness, or discomfort as you do these exercises. Stop right away if you feel sudden pain or your pain gets worse. Do not begin these exercises until told by your health care provider. Stretching and range-of-motion exercises These exercises warm up your muscles and joints and improve the movement and flexibility of your knee. These exercises also help to relieve pain and swelling. Knee extension, prone 1. Lie on your abdomen (prone position) on a bed. 2. Place your left / right knee just beyond the edge of the surface so your knee is not on the bed. You can put a towel under your left / right thigh just above your kneecap for comfort. 3. Relax your leg muscles and allow gravity to straighten your knee (extension). You should feel a stretch behind your left / right knee. 4. Hold this position for __________ seconds. 5. Scoot up so your knee is supported between repetitions. Repeat __________ times. Complete this exercise __________ times a day. Knee flexion, active  1. Lie on your back with both legs straight. If this causes back discomfort, bend your left / right knee so your foot is flat on the floor. 2. Slowly slide your left / right heel back toward your buttocks. Stop when you feel a gentle stretch in the front of your knee or thigh (flexion). 3. Hold this position for __________ seconds. 4. Slowly slide your left / right heel back to the starting position. Repeat __________ times. Complete this exercise __________ times a day. Quadriceps stretch, prone  1. Lie on your abdomen on a firm surface, such as a bed or padded floor. 2. Bend your left / right knee and hold  your ankle. If you cannot reach your ankle or pant leg, loop a belt around your foot and grab the belt instead. 3. Gently pull your heel toward your buttocks. Your knee should not slide out to the side. You should feel a stretch in the front of your thigh and knee (quadriceps). 4. Hold this position for __________ seconds. Repeat __________ times. Complete this exercise __________ times a day. Hamstring, supine 1. Lie on your back (supine position). 2. Loop a belt or towel over the ball of your left / right foot. The ball of your foot is on the walking surface, right under your toes. 3. Straighten your left / right knee and slowly pull on the belt to raise your leg until you feel a gentle stretch behind your knee (hamstring). ? Do not let your knee bend while you do this. ? Keep your other leg flat on the floor. 4. Hold this position for __________ seconds. Repeat __________ times. Complete this exercise __________ times a day. Strengthening exercises These exercises build strength and endurance in your knee. Endurance is the ability to use your muscles for a long time, even after they get tired. Quadriceps, isometric This exercise stretches the muscles in front of your thigh (quadriceps) without moving your knee joint (isometric). 1. Lie on your back with your left / right leg extended and your other knee bent. Put a rolled towel or small pillow under your knee if told by your health care provider. 2. Slowly tense the muscles in the front of your left /   right thigh. You should see your kneecap slide up toward your hip or see increased dimpling just above the knee. This motion will push the back of the knee toward the floor. 3. For __________ seconds, hold the muscle as tight as you can without increasing your pain. 4. Relax the muscles slowly and completely. Repeat __________ times. Complete this exercise __________ times a day. Straight leg raises This exercise stretches the muscles in front  of your thigh (quadriceps) and the muscles that move your hips (hip flexors). 1. Lie on your back with your left / right leg extended and your other knee bent. 2. Tense the muscles in the front of your left / right thigh. You should see your kneecap slide up or see increased dimpling just above the knee. Your thigh may even shake a bit. 3. Keep these muscles tight as you raise your leg 4-6 inches (10-15 cm) off the floor. Do not let your knee bend. 4. Hold this position for __________ seconds. 5. Keep these muscles tense as you lower your leg. 6. Relax your muscles slowly and completely after each repetition. Repeat __________ times. Complete this exercise __________ times a day. Hamstring, isometric 1. Lie on your back on a firm surface. 2. Bend your left / right knee about __________ degrees. 3. Dig your left / right heel into the surface as if you are trying to pull it toward your buttocks. Tighten the muscles in the back of your thighs (hamstring) to "dig" as hard as you can without increasing any pain. 4. Hold this position for __________ seconds. 5. Release the tension gradually and allow your muscles to relax completely for __________ seconds after each repetition. Repeat __________ times. Complete this exercise __________ times a day. Hamstring curls If told by your health care provider, do this exercise while wearing ankle weights. Begin with __________ lb weights. Then increase the weight by 1 lb (0.5 kg) increments. Do not wear ankle weights that are more than __________ lb. 1. Lie on your abdomen with your legs straight. 2. Bend your left / right knee as far as you can without feeling pain. Keep your hips flat against the floor. 3. Hold this position for __________ seconds. 4. Slowly lower your leg to the starting position. Repeat __________ times. Complete this exercise __________ times a day. Squats This exercise strengthens the muscles in front of your thigh and knee  (quadriceps). 1. Stand in front of a table, with your feet and knees pointing straight ahead. You may rest your hands on the table for balance but not for support. 2. Slowly bend your knees and lower your hips like you are going to sit in a chair. ? Keep your weight over your heels, not over your toes. ? Keep your lower legs upright so they are parallel with the table legs. ? Do not let your hips go lower than your knees. ? Do not bend lower than told by your health care provider. ? If your knee pain increases, do not bend as low. 3. Hold the squat position for __________ seconds. 4. Slowly push with your legs to return to standing. Do not use your hands to pull yourself to standing. Repeat __________ times. Complete this exercise __________ times a day. Wall slides This exercise strengthens the muscles in front of your thigh and knee (quadriceps). 1. Lean your back against a smooth wall or door, and walk your feet out 18-24 inches (46-61 cm) from it. 2. Place your feet hip-width apart. 3.   Slowly slide down the wall or door until your knees bend __________ degrees. Keep your knees over your heels, not over your toes. Keep your knees in line with your hips. 4. Hold this position for __________ seconds. Repeat __________ times. Complete this exercise __________ times a day. Straight leg raises This exercise strengthens the muscles that rotate the leg at the hip and move it away from your body (hip abductors). 1. Lie on your side with your left / right leg in the top position. Lie so your head, shoulder, knee, and hip line up. You may bend your bottom knee to help you keep your balance. 2. Roll your hips slightly forward so your hips are stacked directly over each other and your left / right knee is facing forward. 3. Leading with your heel, lift your top leg 4-6 inches (10-15 cm). You should feel the muscles in your outer hip lifting. ? Do not let your foot drift forward. ? Do not let your knee  roll toward the ceiling. 4. Hold this position for __________ seconds. 5. Slowly return your leg to the starting position. 6. Let your muscles relax completely after each repetition. Repeat __________ times. Complete this exercise __________ times a day. Straight leg raises This exercise stretches the muscles that move your hips away from the front of the pelvis (hip extensors). 1. Lie on your abdomen on a firm surface. You can put a pillow under your hips if that is more comfortable. 2. Tense the muscles in your buttocks and lift your left / right leg about 4-6 inches (10-15 cm). Keep your knee straight as you lift your leg. 3. Hold this position for __________ seconds. 4. Slowly lower your leg to the starting position. 5. Let your leg relax completely after each repetition. Repeat __________ times. Complete this exercise __________ times a day. This information is not intended to replace advice given to you by your health care provider. Make sure you discuss any questions you have with your health care provider. Document Revised: 04/09/2018 Document Reviewed: 04/09/2018 [employer] Patient Education  2020 [employer] Hand Exercises Hand exercises can be helpful for almost anyone. These exercises can strengthen the hands, improve flexibility and movement, and increase blood flow to the hands. These results can make work and daily tasks easier. Hand exercises can be especially helpful for people who have joint pain from arthritis or have nerve damage from overuse (carpal tunnel syndrome). These exercises can also help people who have injured a hand. Exercises Most of these hand exercises are gentle stretching and motion exercises. It is usually safe to do them often throughout the day. Warming up your hands before exercise may help to reduce stiffness. You can do this with gentle massage or by placing your hands in warm water for 10-15 minutes. It is normal to feel some stretching, pulling,  tightness, or mild discomfort as you begin new exercises. This will gradually improve. Stop an exercise right away if you feel sudden, severe pain or your pain gets worse. Ask your health care provider which exercises are best for you. Knuckle bend or "claw" fist 1. Stand or sit with your arm, hand, and all five fingers pointed straight up. Make sure to keep your wrist straight during the exercise. 2. Gently bend your fingers down toward your palm until the tips of your fingers are touching the top of your palm. Keep your big knuckle straight and just bend the small knuckles in your fingers. 3. Hold this position for   __________ seconds. 4. Straighten (extend) your fingers back to the starting position. Repeat this exercise 5-10 times with each hand. Full finger fist 1. Stand or sit with your arm, hand, and all five fingers pointed straight up. Make sure to keep your wrist straight during the exercise. 2. Gently bend your fingers into your palm until the tips of your fingers are touching the middle of your palm. 3. Hold this position for __________ seconds. 4. Extend your fingers back to the starting position, stretching every joint fully. Repeat this exercise 5-10 times with each hand. Straight fist 1. Stand or sit with your arm, hand, and all five fingers pointed straight up. Make sure to keep your wrist straight during the exercise. 2. Gently bend your fingers at the big knuckle, where your fingers meet your hand, and the middle knuckle. Keep the knuckle at the tips of your fingers straight and try to touch the bottom of your palm. 3. Hold this position for __________ seconds. 4. Extend your fingers back to the starting position, stretching every joint fully. Repeat this exercise 5-10 times with each hand. Tabletop 1. Stand or sit with your arm, hand, and all five fingers pointed straight up. Make sure to keep your wrist straight during the exercise. 2. Gently bend your fingers at the big  knuckle, where your fingers meet your hand, as far down as you can while keeping the small knuckles in your fingers straight. Think of forming a tabletop with your fingers. 3. Hold this position for __________ seconds. 4. Extend your fingers back to the starting position, stretching every joint fully. Repeat this exercise 5-10 times with each hand. Finger spread 1. Place your hand flat on a table with your palm facing down. Make sure your wrist stays straight as you do this exercise. 2. Spread your fingers and thumb apart from each other as far as you can until you feel a gentle stretch. Hold this position for __________ seconds. 3. Bring your fingers and thumb tight together again. Hold this position for __________ seconds. Repeat this exercise 5-10 times with each hand. Making circles 1. Stand or sit with your arm, hand, and all five fingers pointed straight up. Make sure to keep your wrist straight during the exercise. 2. Make a circle by touching the tip of your thumb to the tip of your index finger. 3. Hold for __________ seconds. Then open your hand wide. 4. Repeat this motion with your thumb and each finger on your hand. Repeat this exercise 5-10 times with each hand. Thumb motion 1. Sit with your forearm resting on a table and your wrist straight. Your thumb should be facing up toward the ceiling. Keep your fingers relaxed as you move your thumb. 2. Lift your thumb up as high as you can toward the ceiling. Hold for __________ seconds. 3. Bend your thumb across your palm as far as you can, reaching the tip of your thumb for the small finger (pinkie) side of your palm. Hold for __________ seconds. Repeat this exercise 5-10 times with each hand. Grip strengthening  1. Hold a stress ball or other soft ball in the middle of your hand. 2. Slowly increase the pressure, squeezing the ball as much as you can without causing pain. Think of bringing the tips of your fingers into the middle of  your palm. All of your finger joints should bend when doing this exercise. 3. Hold your squeeze for __________ seconds, then relax. Repeat this exercise 5-10 times with each   hand. Contact a health care provider if:  Your hand pain or discomfort gets much worse when you do an exercise.  Your hand pain or discomfort does not improve within 2 hours after you exercise. If you have any of these problems, stop doing these exercises right away. Do not do them again unless your health care provider says that you can. Get help right away if:  You develop sudden, severe hand pain or swelling. If this happens, stop doing these exercises right away. Do not do them again unless your health care provider says that you can. This information is not intended to replace advice given to you by your health care provider. Make sure you discuss any questions you have with your health care provider. Document Revised: 10/10/2018 Document Reviewed: 06/20/2018 [employer] Patient Education  2020 [employer] Back Exercises The following exercises strengthen the muscles that help to support the trunk and back. They also help to keep the lower back flexible. Doing these exercises can help to prevent back pain or lessen existing pain.  If you have back pain or discomfort, try doing these exercises 2-3 times each day or as told by your health care provider.  As your pain improves, do them once each day, but increase the number of times that you repeat the steps for each exercise (do more repetitions).  To prevent the recurrence of back pain, continue to do these exercises once each day or as told by your health care provider. Do exercises exactly as told by your health care provider and adjust them as directed. It is normal to feel mild stretching, pulling, tightness, or discomfort as you do these exercises, but you should stop right away if you feel sudden pain or your pain gets worse. Exercises Single knee to  chest Repeat these steps 3-5 times for each leg: 1. Lie on your back on a firm bed or the floor with your legs extended. 2. Bring one knee to your chest. Your other leg should stay extended and in contact with the floor. 3. Hold your knee in place by grabbing your knee or thigh with both hands and hold. 4. Pull on your knee until you feel a gentle stretch in your lower back or buttocks. 5. Hold the stretch for 10-30 seconds. 6. Slowly release and straighten your leg. Pelvic tilt Repeat these steps 5-10 times: 1. Lie on your back on a firm bed or the floor with your legs extended. 2. Bend your knees so they are pointing toward the ceiling and your feet are flat on the floor. 3. Tighten your lower abdominal muscles to press your lower back against the floor. This motion will tilt your pelvis so your tailbone points up toward the ceiling instead of pointing to your feet or the floor. 4. With gentle tension and even breathing, hold this position for 5-10 seconds. Cat-cow Repeat these steps until your lower back becomes more flexible: 1. Get into a hands-and-knees position on a firm surface. Keep your hands under your shoulders, and keep your knees under your hips. You may place padding under your knees for comfort. 2. Let your head hang down toward your chest. Contract your abdominal muscles and point your tailbone toward the floor so your lower back becomes rounded like the back of a cat. 3. Hold this position for 5 seconds. 4. Slowly lift your head, let your abdominal muscles relax and point your tailbone up toward the ceiling so your back forms a sagging arch like the   back of a cow. 5. Hold this position for 5 seconds.  Press-ups Repeat these steps 5-10 times: 1. Lie on your abdomen (face-down) on the floor. 2. Place your palms near your head, about shoulder-width apart. 3. Keeping your back as relaxed as possible and keeping your hips on the floor, slowly straighten your arms to raise the  top half of your body and lift your shoulders. Do not use your back muscles to raise your upper torso. You may adjust the placement of your hands to make yourself more comfortable. 4. Hold this position for 5 seconds while you keep your back relaxed. 5. Slowly return to lying flat on the floor.  Bridges Repeat these steps 10 times: 1. Lie on your back on a firm surface. 2. Bend your knees so they are pointing toward the ceiling and your feet are flat on the floor. Your arms should be flat at your sides, next to your body. 3. Tighten your buttocks muscles and lift your buttocks off the floor until your waist is at almost the same height as your knees. You should feel the muscles working in your buttocks and the back of your thighs. If you do not feel these muscles, slide your feet 1-2 inches farther away from your buttocks. 4. Hold this position for 3-5 seconds. 5. Slowly lower your hips to the starting position, and allow your buttocks muscles to relax completely. If this exercise is too easy, try doing it with your arms crossed over your chest. Abdominal crunches Repeat these steps 5-10 times: 1. Lie on your back on a firm bed or the floor with your legs extended. 2. Bend your knees so they are pointing toward the ceiling and your feet are flat on the floor. 3. Cross your arms over your chest. 4. Tip your chin slightly toward your chest without bending your neck. 5. Tighten your abdominal muscles and slowly raise your trunk (torso) high enough to lift your shoulder blades a tiny bit off the floor. Avoid raising your torso higher than that because it can put too much stress on your low back and does not help to strengthen your abdominal muscles. 6. Slowly return to your starting position. Back lifts Repeat these steps 5-10 times: 1. Lie on your abdomen (face-down) with your arms at your sides, and rest your forehead on the floor. 2. Tighten the muscles in your legs and your  buttocks. 3. Slowly lift your chest off the floor while you keep your hips pressed to the floor. Keep the back of your head in line with the curve in your back. Your eyes should be looking at the floor. 4. Hold this position for 3-5 seconds. 5. Slowly return to your starting position. Contact a health care provider if:  Your back pain or discomfort gets much worse when you do an exercise.  Your worsening back pain or discomfort does not lessen within 2 hours after you exercise. If you have any of these problems, stop doing these exercises right away. Do not do them again unless your health care provider says that you can. Get help right away if:  You develop sudden, severe back pain. If this happens, stop doing the exercises right away. Do not do them again unless your health care provider says that you can. This information is not intended to replace advice given to you by your health care provider. Make sure you discuss any questions you have with your health care provider. Document Revised: 10/24/2018 Document Reviewed: 03/21/2018   [employer]vier Patient Education  2020 [employer]  

## 2019-09-30 LAB — RNP ANTIBODY: Ribonucleic Protein(ENA) Antibody, IgG: 2.1 AI — AB

## 2019-09-30 LAB — ANA: Anti Nuclear Antibody (ANA): NEGATIVE

## 2019-10-01 ENCOUNTER — Ambulatory Visit (HOSPITAL_COMMUNITY)
Admission: RE | Admit: 2019-10-01 | Discharge: 2019-10-01 | Disposition: A | Discharge status: Home / Self Care | Payer: Medicare Other | Source: Ambulatory Visit | Attending: Gastroenterology | Admitting: Gastroenterology

## 2019-10-01 ENCOUNTER — Encounter (HOSPITAL_COMMUNITY): Payer: Self-pay

## 2019-10-01 ENCOUNTER — Other Ambulatory Visit: Payer: Self-pay

## 2019-10-01 DIAGNOSIS — R1011 Right upper quadrant pain: Secondary | ICD-10-CM

## 2019-10-01 NOTE — Progress Notes (Signed)
I called patient and discussed lab results. She has no clinical features of autoimmune disease at this time.

## 2019-10-07 ENCOUNTER — Encounter: Payer: Self-pay | Admitting: Internal Medicine

## 2019-10-07 NOTE — Progress Notes (Signed)
PATIENT SCHEDULED AND LETTER SENT  °

## 2019-10-08 ENCOUNTER — Other Ambulatory Visit: Payer: Self-pay | Admitting: *Deleted

## 2019-10-08 ENCOUNTER — Telehealth: Payer: Self-pay | Admitting: Internal Medicine

## 2019-10-08 ENCOUNTER — Telehealth: Payer: Self-pay | Admitting: *Deleted

## 2019-10-08 DIAGNOSIS — R1011 Right upper quadrant pain: Secondary | ICD-10-CM

## 2019-10-08 NOTE — Telephone Encounter (Signed)
Opened in error.  Another telephone note in progress for today.

## 2019-10-08 NOTE — Telephone Encounter (Signed)
Pt was seen recently and her Hida scan has been scheduled. She is saying that she is in so much pain and wanted to talk with AB or the nurse. I told her AB was with patients and AM had left early today, but I would let someone know that she had called. Please advise. [phone]

## 2019-10-08 NOTE — Telephone Encounter (Signed)
Called pt back.  She said that she has been having pain since her last ov but it seemed to worsen since yesterday.  She feels bloated, having nausea, and has a constant pain under her breast to her side and it radiates through her back.  She is having occasional loose stools.  She has a HIDA scan scheduled for 10/13/19 but would like recommendations until then.

## 2019-10-09 MED ORDER — ONDANSETRON HCL 4 MG PO TABS: 4.0000 mg | ORAL_TABLET | Freq: Three times a day (TID) | ORAL | 1 refills | Status: DC | PRN

## 2019-10-09 NOTE — Addendum Note (Signed)
Addended by: Cheron Every on: 10/09/2019 09:07 AM   Modules accepted: Orders

## 2019-10-09 NOTE — Telephone Encounter (Signed)
Called patient and she is aware of recommendations below. She voiced understanding.   HIDA moved up to 4/9 at 10:00am, arrival 9:45am. Patient aware of appt details. She will have lab work done tomorrow. Aware zofran called in. Will follow low-fat diet, no greasy foods, no carbonation for now.

## 2019-10-09 NOTE — Addendum Note (Signed)
Addended by: Annitta Needs on: 10/09/2019 08:28 AM   Modules accepted: Orders

## 2019-10-09 NOTE — Telephone Encounter (Signed)
If pain is worse like she is stating, we need to check CMP and lipase.   Let's increase Protonix to BID. I'm sending in Zofran for nausea.   Follow low-fat diet, no greasy foods, no carbonation for now.  If we can get HIDA moved up, that would be great.

## 2019-10-10 ENCOUNTER — Ambulatory Visit (HOSPITAL_COMMUNITY)
Admission: RE | Admit: 2019-10-10 | Discharge: 2019-10-10 | Disposition: A | Discharge status: Home / Self Care | Payer: Medicare Other | Source: Ambulatory Visit | Attending: Gastroenterology | Admitting: Gastroenterology

## 2019-10-10 ENCOUNTER — Other Ambulatory Visit: Payer: Self-pay

## 2019-10-10 ENCOUNTER — Other Ambulatory Visit (HOSPITAL_COMMUNITY)
Admission: RE | Admit: 2019-10-10 | Discharge: 2019-10-10 | Disposition: A | Discharge status: Home / Self Care | Payer: Medicare Other | Source: Ambulatory Visit | Attending: Gastroenterology | Admitting: Gastroenterology

## 2019-10-10 DIAGNOSIS — R1011 Right upper quadrant pain: Secondary | ICD-10-CM | POA: Insufficient documentation

## 2019-10-10 LAB — COMPREHENSIVE METABOLIC PANEL
ALT: 23 U/L (ref 0–44)
AST: 19 U/L (ref 15–41)
Albumin: 4 g/dL (ref 3.5–5.0)
Alkaline Phosphatase: 61 U/L (ref 38–126)
Anion gap: 8 (ref 5–15)
BUN: 13 mg/dL (ref 6–20)
CO2: 24 mmol/L (ref 22–32)
Calcium: 9.3 mg/dL (ref 8.9–10.3)
Chloride: 104 mmol/L (ref 98–111)
Creatinine, Ser: 0.81 mg/dL (ref 0.44–1.00)
GFR calc Af Amer: >60 mL/min (ref >60)
GFR calc non Af Amer: >60 mL/min (ref >60)
Glucose, Bld: 153 mg/dL — ABNORMAL HIGH (ref 70–99)
Potassium: 3.6 mmol/L (ref 3.5–5.1)
Sodium: 136 mmol/L (ref 135–145)
Total Bilirubin: 0.4 mg/dL (ref 0.3–1.2)
Total Protein: 6.7 g/dL (ref 6.5–8.1)

## 2019-10-10 LAB — LIPASE, BLOOD: Lipase: 32 U/L (ref 11–51)

## 2019-10-10 MED ORDER — TECHNETIUM TC 99M MEBROFENIN IV KIT
5.0000 | PACK | Freq: Once | INTRAVENOUS | Status: AC | PRN
  Administered 2019-10-10: 5.1 via INTRAVENOUS

## 2019-10-13 ENCOUNTER — Ambulatory Visit (HOSPITAL_COMMUNITY): Payer: Medicare Other

## 2019-10-22 ENCOUNTER — Other Ambulatory Visit: Payer: Self-pay | Admitting: *Deleted

## 2019-10-22 ENCOUNTER — Ambulatory Visit (INDEPENDENT_AMBULATORY_CARE_PROVIDER_SITE_OTHER): Payer: Medicare Other | Admitting: Gastroenterology

## 2019-10-22 ENCOUNTER — Ambulatory Visit (HOSPITAL_COMMUNITY)
Admission: RE | Admit: 2019-10-22 | Discharge: 2019-10-22 | Disposition: A | Discharge status: Home / Self Care | Payer: Medicare Other | Source: Ambulatory Visit | Attending: Gastroenterology | Admitting: Gastroenterology

## 2019-10-22 ENCOUNTER — Encounter (HOSPITAL_COMMUNITY): Payer: Self-pay

## 2019-10-22 ENCOUNTER — Encounter: Payer: Self-pay | Admitting: *Deleted

## 2019-10-22 ENCOUNTER — Other Ambulatory Visit: Payer: Self-pay

## 2019-10-22 ENCOUNTER — Encounter: Payer: Self-pay | Admitting: Gastroenterology

## 2019-10-22 ENCOUNTER — Telehealth: Payer: Self-pay | Admitting: *Deleted

## 2019-10-22 VITALS — BP 118/76 | HR 99 | Temp 97.0°F | Ht 62.5 in | Wt 156.0 lb

## 2019-10-22 DIAGNOSIS — R1011 Right upper quadrant pain: Secondary | ICD-10-CM

## 2019-10-22 DIAGNOSIS — R0781 Pleurodynia: Secondary | ICD-10-CM

## 2019-10-22 NOTE — Patient Instructions (Signed)
We are arranging an upper endoscopy with Dr. Gala Romney in the near future.  We are also referring you to General Surgery to talk about taking out your gallbladder.  I have ordered a chest xray for today that you can go and have done this afternoon.  Further recommendations to follow!  I enjoyed seeing you again today! As you know, I value our relationship and want to provide genuine, compassionate, and quality care. I welcome your feedback. If you receive a survey regarding your visit,  I greatly appreciate you taking time to fill this out. See you next time!  Annitta Needs, PhD, ANP-BC [facility]

## 2019-10-22 NOTE — Progress Notes (Signed)
Referring Provider: Jani Gravel, MD Primary Care Physician:  Jani Gravel, MD Primary GI: Dr. Gala Romney   Chief Complaint  Patient presents with  . Abdominal Pain    RUQ (a lot of pain)  . Nausea    no vomiting  . diarrhea/constipation    HPI:   Susan Baker is a 59 y.o. female presenting today with a history of chronic abdominal pain, vomiting, and diarrhea. Extensive evaluation in 2012 with ultrasound, HIDA, MRI. She did note mild abdominal pain with CCK infusion during HIDA but EF was 82% in the past. EGD/colonoscopy in 2017: normal colon, normal TI, non-bleeding hemorrhoids. LA grade A esophagitis, s/p dilation, small hiatal hernia, normal stomach and duodenum. Due to recurrent persistent RUQ pain at last visit, US abdomen complete updated on 10/01/19: fatty liver, normal HIDA with EF of 65%, with reported pain and nausea following Ensure. Here to discuss EGD to rule out occult gastritis, PUD, prior to consideration for cholecystectomy. CMP and lipase normal.   Constant RUQ pain. Worsens as day goes by. By evening, stomach is bloated. Eats a lot of chicken. Richer type foods may worsen pain. Overall constant. Sometimes has to hold breath. Associated nausea. Mother just passed away Oct 13, 2022 after sepsis in setting of cholecystitis per patient. GERD is controlled. Alternating constipation/diarrhea. RUQ pain not associated with BMs. Protonix BID. No weight loss. Has been gaining weight over the past year. Ibuprofen is only thing that helps her headaches. Trying to take sparingly but used to take frequently.   Past Medical History:  Diagnosis Date  . Abnormal Pap smear   . Abnormal ThinPrep Pap test of vagina 04/25/2017   LSIL will get colp__________  . Anxiety   . Bulging disc    cervical, [facility]  . Depression 02/18/2013  . Fibromyalgia   . GERD (gastroesophageal reflux disease)    egd with RE per patient, remote  . Hematuria 05/14/2014  . History of abnormal cervical Pap  smear 02/24/2014  . Osteoarthritis (arthritis due to wear and tear of joints)   . Osteoporosis   . Osteoporosis, unspecified 03/03/2014  . Postcoital bleeding 09/15/2015  . Postmenopausal 02/24/2014  . PUD (peptic ulcer disease)    bleeding PUD per patient, remote, On Daypro  . Urinary frequency 05/14/2014  . Uterine cancer ([place])   . Vaginal Pap smear, abnormal     Past Surgical History:  Procedure Laterality Date  . COLONOSCOPY WITH PROPOFOL N/A 11/22/2015   Dr.Rourk- the entire examined colon is normal, the examined portion of the ileum was normal, non-bleeding internal hemorrhoids. no specimens collected.   Marland Kitchen ECTOPIC PREGNANCY SURGERY     twice  . ESOPHAGEAL DILATION N/A 11/22/2015   Procedure: ESOPHAGEAL DILATION;  Surgeon: Daneil Dolin, MD;  [facility];  Service: Endoscopy;  Laterality: N/A;  . ESOPHAGOGASTRODUODENOSCOPY  12/01/2010   Dr. Gala Romney: mild distal ERE, antral erosions due to NSAIDS, no H.Pylori  . ESOPHAGOGASTRODUODENOSCOPY (EGD) WITH PROPOFOL N/A 11/22/2015   Dr.Rourk- grade A esophagitis, dilated. small hiatal hernia, normal stomach, normal second portion of the duodenum.  . hysterectomy for uterine cancer     partial    Current Outpatient Medications  Medication Sig Dispense Refill  . FLUoxetine (PROZAC) 40 MG capsule Take 40 mg by mouth 2 (two) times daily.     Marland Kitchen ibuprofen (ADVIL,MOTRIN) 200 MG tablet Take 200 mg by mouth as needed.     . ondansetron (ZOFRAN) 4 MG tablet Take 1  tablet (4 mg total) by mouth every 8 (eight) hours as needed for nausea or vomiting. 30 tablet 1  . OVER THE COUNTER MEDICATION daily.    . Oxycodone HCl 10 MG TABS Take 10 mg by mouth 4 (four) times daily.  0  . pantoprazole (PROTONIX) 40 MG tablet Take 1 tablet (40 mg total) by mouth daily. Take 30 minutes before breakfast (Patient taking differently: Take 40 mg by mouth 2 (two) times daily before a meal. ) 30 tablet 3  . pregabalin (LYRICA) 150 MG capsule Take 150 mg by mouth 2  (two) times daily.    . QUEtiapine (SEROQUEL) 25 MG tablet 25 mg as needed.   3  . zolpidem (AMBIEN CR) 12.5 MG CR tablet 12.5 mg at bedtime as needed.      No current facility-administered medications for this visit.    Allergies as of 10/22/2019 - Review Complete 10/22/2019  Allergen Reaction Noted  . Cymbalta [duloxetine hcl] Other (See Comments) 02/22/2012  . Daypro [oxaprozin] Other (See Comments) 11/07/2010  . Sulfa antibiotics Other (See Comments) 02/22/2012  . Trazodone and nefazodone Other (See Comments) 02/22/2012  . Nucynta [tapentadol] Other (See Comments) 08/18/2015    Family History  Problem Relation Age of Onset  . Cirrhosis Father        etoh, died with liver cancer  . Diabetes Father   . Cirrhosis Paternal Uncle        multiple, etoh  . Pancreatic cancer Paternal Grandmother   . Diabetes Paternal Grandmother   . Kidney failure Mother   . Diabetes Mother   . Other Brother        crohn's disease  . Hyperlipidemia Brother   . Fibromyalgia Daughter   . Diabetes Maternal Grandmother   . Healthy Son   . Colon cancer Maternal Uncle     Social History   Socioeconomic History  . Marital status: Divorced    Spouse name: Not on file  . Number of children: 2  . Years of education: Not on file  . Highest education level: Not on file  Occupational History  . Occupation: disability  Tobacco Use  . Smoking status: Current Some Day Smoker    Packs/day: 0.50    Years: 31.00    Pack years: 15.50    Types: E-cigarettes  . Smokeless tobacco: Never Used  . Tobacco comment: using vape at times  Substance and Sexual Activity  . Alcohol use: Yes    Alcohol/week: 0.0 standard drinks    Comment: occasional social use   . Drug use: No  . Sexual activity: Not Currently    Birth control/protection: Surgical    Comment: hysterectomy  Other Topics Concern  . Not on file  Social History Narrative  . Not on file   Social Determinants of Health   Financial Resource  Strain:   . Difficulty of Paying Living Expenses:   Food Insecurity:   . Worried About Charity fundraiser in the Last Year:   . Arboriculturist in the Last Year:   Transportation Needs:   . Film/video editor (Medical):   Marland Kitchen Lack of Transportation (Non-Medical):   Physical Activity:   . Days of Exercise per Week:   . Minutes of Exercise per Session:   Stress:   . Feeling of Stress :   Social Connections:   . Frequency of Communication with Friends and Family:   . Frequency of Social Gatherings with Friends and Family:   .  Attends Religious Services:   . Active Member of Clubs or Organizations:   . Attends Archivist Meetings:   Marland Kitchen Marital Status:     Review of Systems: Gen: Denies fever, chills, anorexia. Denies fatigue, weakness, weight loss.  CV: Denies chest pain, palpitations, syncope, peripheral edema, and claudication. Resp: Denies dyspnea at rest, cough, wheezing, coughing up blood, and pleurisy. GI: see HPI  Derm: Denies rash, itching, dry skin Psych: Denies depression, anxiety, memory loss, confusion. No homicidal or suicidal ideation.  Heme: Denies bruising, bleeding, and enlarged lymph nodes.  Physical Exam: BP 118/76   Pulse 99   Temp (!) 97 F (36.1 C) (Oral)   Ht 5' 2.5" (1.588 m)   Wt 156 lb (70.8 kg)   BMI 28.08 kg/m  General:   Alert and oriented. No distress noted. Pleasant and cooperative.  Head:  Normocephalic and atraumatic. Eyes:  Conjuctiva clear without scleral icterus. Mouth:  Mask in place Abdomen:  +BS, soft, TTP RUQ and right rib margin and non-distended. No rebound or guarding. No HSM or masses noted. Msk:  Symmetrical without gross deformities. Normal posture. Extremities:  Without edema. Neurologic:  Alert and  oriented x4 Psych:  Alert and cooperative. Normal mood and affect.  ASSESSMENT: Susan Baker is a 59 y.o. female presenting today with history of chronic abdominal pain, N/V, intermittent diarrhea, here to discuss  updated EGD due to persistent RUQ pain.  RUQ Pain: Extensive evaluation in 2012 with ultrasound, HIDA, MRI. She did note mild abdominal pain with CCK infusion during HIDA but EF was 82% in the past. Most updated [place] this year without gallstones, +fatty liver, and HIDA with EF 65%. She did note worsening of symptoms with Ensure. Pain is not typical of biliary as it is constant. With exam, she has right rib pain with palpation. Query element of musculoskeletal etiology. CMP and lipase normal. No updated CT as of yet; she has had no weight loss. Will proceed with updated EGD as last was in 2017 with esophagitis. I suspect this will be low yield. Will also refer to Surgery to discuss cholecystectomy if EGD is negative. May ultimately need CT.    PLAN:   Continue Protonix BID  Proceed with upper endoscopy in the near future with Dr. Gala Romney using Propofol. The risks, benefits, and alternatives have been discussed in detail with patient. They have stated understanding and desire to proceed.   CXR was completed due to notable rib pain: this was normal  Referral to Surgery.   CT if EGD and surgical evaluation unrevealing and/or alarm signs/symptoms   Annitta Needs, PhD, [facility] Gastroenterology

## 2019-10-22 NOTE — Telephone Encounter (Signed)
Left detailed message on VM regarding pre-op/covid testing appt details.  Also message sent to [facility].

## 2019-10-22 NOTE — H&P (View-Only) (Signed)
Referring Provider: Jani Gravel, MD Primary Care Physician:  Jani Gravel, MD Primary GI: Dr. Gala Romney   Chief Complaint  Patient presents with  . Abdominal Pain    RUQ (a lot of pain)  . Nausea    no vomiting  . diarrhea/constipation    HPI:   Susan Baker is a 59 y.o. female presenting today with a history of chronic abdominal pain, vomiting, and diarrhea. Extensive evaluation in 2012 with ultrasound, HIDA, MRI. She did note mild abdominal pain with CCK infusion during HIDA but EF was 82% in the past. EGD/colonoscopy in 2017: normal colon, normal TI, non-bleeding hemorrhoids. LA grade A esophagitis, s/p dilation, small hiatal hernia, normal stomach and duodenum. Due to recurrent persistent RUQ pain at last visit, US abdomen complete updated on 10/01/19: fatty liver, normal HIDA with EF of 65%, with reported pain and nausea following Ensure. Here to discuss EGD to rule out occult gastritis, PUD, prior to consideration for cholecystectomy. CMP and lipase normal.   Constant RUQ pain. Worsens as day goes by. By evening, stomach is bloated. Eats a lot of chicken. Richer type foods may worsen pain. Overall constant. Sometimes has to hold breath. Associated nausea. Mother just passed away 01-Oct-2022 after sepsis in setting of cholecystitis per patient. GERD is controlled. Alternating constipation/diarrhea. RUQ pain not associated with BMs. Protonix BID. No weight loss. Has been gaining weight over the past year. Ibuprofen is only thing that helps her headaches. Trying to take sparingly but used to take frequently.   Past Medical History:  Diagnosis Date  . Abnormal Pap smear   . Abnormal ThinPrep Pap test of vagina 04/25/2017   LSIL will get colp__________  . Anxiety   . Bulging disc    cervical, [facility]  . Depression 02/18/2013  . Fibromyalgia   . GERD (gastroesophageal reflux disease)    egd with RE per patient, remote  . Hematuria 05/14/2014  . History of abnormal cervical Pap  smear 02/24/2014  . Osteoarthritis (arthritis due to wear and tear of joints)   . Osteoporosis   . Osteoporosis, unspecified 03/03/2014  . Postcoital bleeding 09/15/2015  . Postmenopausal 02/24/2014  . PUD (peptic ulcer disease)    bleeding PUD per patient, remote, On Daypro  . Urinary frequency 05/14/2014  . Uterine cancer ([place])   . Vaginal Pap smear, abnormal     Past Surgical History:  Procedure Laterality Date  . COLONOSCOPY WITH PROPOFOL N/A 11/22/2015   Dr.Rourk- the entire examined colon is normal, the examined portion of the ileum was normal, non-bleeding internal hemorrhoids. no specimens collected.   Marland Kitchen ECTOPIC PREGNANCY SURGERY     twice  . ESOPHAGEAL DILATION N/A 11/22/2015   Procedure: ESOPHAGEAL DILATION;  Surgeon: Daneil Dolin, MD;  [facility];  Service: Endoscopy;  Laterality: N/A;  . ESOPHAGOGASTRODUODENOSCOPY  12/01/2010   Dr. Gala Romney: mild distal ERE, antral erosions due to NSAIDS, no H.Pylori  . ESOPHAGOGASTRODUODENOSCOPY (EGD) WITH PROPOFOL N/A 11/22/2015   Dr.Rourk- grade A esophagitis, dilated. small hiatal hernia, normal stomach, normal second portion of the duodenum.  . hysterectomy for uterine cancer     partial    Current Outpatient Medications  Medication Sig Dispense Refill  . FLUoxetine (PROZAC) 40 MG capsule Take 40 mg by mouth 2 (two) times daily.     Marland Kitchen ibuprofen (ADVIL,MOTRIN) 200 MG tablet Take 200 mg by mouth as needed.     . ondansetron (ZOFRAN) 4 MG tablet Take 1  tablet (4 mg total) by mouth every 8 (eight) hours as needed for nausea or vomiting. 30 tablet 1  . OVER THE COUNTER MEDICATION daily.    . Oxycodone HCl 10 MG TABS Take 10 mg by mouth 4 (four) times daily.  0  . pantoprazole (PROTONIX) 40 MG tablet Take 1 tablet (40 mg total) by mouth daily. Take 30 minutes before breakfast (Patient taking differently: Take 40 mg by mouth 2 (two) times daily before a meal. ) 30 tablet 3  . pregabalin (LYRICA) 150 MG capsule Take 150 mg by mouth 2  (two) times daily.    . QUEtiapine (SEROQUEL) 25 MG tablet 25 mg as needed.   3  . zolpidem (AMBIEN CR) 12.5 MG CR tablet 12.5 mg at bedtime as needed.      No current facility-administered medications for this visit.    Allergies as of 10/22/2019 - Review Complete 10/22/2019  Allergen Reaction Noted  . Cymbalta [duloxetine hcl] Other (See Comments) 02/22/2012  . Daypro [oxaprozin] Other (See Comments) 11/07/2010  . Sulfa antibiotics Other (See Comments) 02/22/2012  . Trazodone and nefazodone Other (See Comments) 02/22/2012  . Nucynta [tapentadol] Other (See Comments) 08/18/2015    Family History  Problem Relation Age of Onset  . Cirrhosis Father        etoh, died with liver cancer  . Diabetes Father   . Cirrhosis Paternal Uncle        multiple, etoh  . Pancreatic cancer Paternal Grandmother   . Diabetes Paternal Grandmother   . Kidney failure Mother   . Diabetes Mother   . Other Brother        crohn's disease  . Hyperlipidemia Brother   . Fibromyalgia Daughter   . Diabetes Maternal Grandmother   . Healthy Son   . Colon cancer Maternal Uncle     Social History   Socioeconomic History  . Marital status: Divorced    Spouse name: Not on file  . Number of children: 2  . Years of education: Not on file  . Highest education level: Not on file  Occupational History  . Occupation: disability  Tobacco Use  . Smoking status: Current Some Day Smoker    Packs/day: 0.50    Years: 31.00    Pack years: 15.50    Types: E-cigarettes  . Smokeless tobacco: Never Used  . Tobacco comment: using vape at times  Substance and Sexual Activity  . Alcohol use: Yes    Alcohol/week: 0.0 standard drinks    Comment: occasional social use   . Drug use: No  . Sexual activity: Not Currently    Birth control/protection: Surgical    Comment: hysterectomy  Other Topics Concern  . Not on file  Social History Narrative  . Not on file   Social Determinants of Health   Financial Resource  Strain:   . Difficulty of Paying Living Expenses:   Food Insecurity:   . Worried About Charity fundraiser in the Last Year:   . Arboriculturist in the Last Year:   Transportation Needs:   . Film/video editor (Medical):   Marland Kitchen Lack of Transportation (Non-Medical):   Physical Activity:   . Days of Exercise per Week:   . Minutes of Exercise per Session:   Stress:   . Feeling of Stress :   Social Connections:   . Frequency of Communication with Friends and Family:   . Frequency of Social Gatherings with Friends and Family:   .  Attends Religious Services:   . Active Member of Clubs or Organizations:   . Attends Archivist Meetings:   Marland Kitchen Marital Status:     Review of Systems: Gen: Denies fever, chills, anorexia. Denies fatigue, weakness, weight loss.  CV: Denies chest pain, palpitations, syncope, peripheral edema, and claudication. Resp: Denies dyspnea at rest, cough, wheezing, coughing up blood, and pleurisy. GI: see HPI  Derm: Denies rash, itching, dry skin Psych: Denies depression, anxiety, memory loss, confusion. No homicidal or suicidal ideation.  Heme: Denies bruising, bleeding, and enlarged lymph nodes.  Physical Exam: BP 118/76   Pulse 99   Temp (!) 97 F (36.1 C) (Oral)   Ht 5' 2.5" (1.588 m)   Wt 156 lb (70.8 kg)   BMI 28.08 kg/m  General:   Alert and oriented. No distress noted. Pleasant and cooperative.  Head:  Normocephalic and atraumatic. Eyes:  Conjuctiva clear without scleral icterus. Mouth:  Mask in place Abdomen:  +BS, soft, TTP RUQ and right rib margin and non-distended. No rebound or guarding. No HSM or masses noted. Msk:  Symmetrical without gross deformities. Normal posture. Extremities:  Without edema. Neurologic:  Alert and  oriented x4 Psych:  Alert and cooperative. Normal mood and affect.  ASSESSMENT: Susan Baker is a 59 y.o. female presenting today with history of chronic abdominal pain, N/V, intermittent diarrhea, here to discuss  updated EGD due to persistent RUQ pain.  RUQ Pain: Extensive evaluation in 2012 with ultrasound, HIDA, MRI. She did note mild abdominal pain with CCK infusion during HIDA but EF was 82% in the past. Most updated [place] this year without gallstones, +fatty liver, and HIDA with EF 65%. She did note worsening of symptoms with Ensure. Pain is not typical of biliary as it is constant. With exam, she has right rib pain with palpation. Query element of musculoskeletal etiology. CMP and lipase normal. No updated CT as of yet; she has had no weight loss. Will proceed with updated EGD as last was in 2017 with esophagitis. I suspect this will be low yield. Will also refer to Surgery to discuss cholecystectomy if EGD is negative. May ultimately need CT.    PLAN:   Continue Protonix BID  Proceed with upper endoscopy in the near future with Dr. Gala Romney using Propofol. The risks, benefits, and alternatives have been discussed in detail with patient. They have stated understanding and desire to proceed.   CXR was completed due to notable rib pain: this was normal  Referral to Surgery.   CT if EGD and surgical evaluation unrevealing and/or alarm signs/symptoms   Annitta Needs, PhD, [facility] Gastroenterology

## 2019-10-24 ENCOUNTER — Other Ambulatory Visit (HOSPITAL_COMMUNITY)
Admission: RE | Admit: 2019-10-24 | Discharge: 2019-10-24 | Disposition: A | Discharge status: Home / Self Care | Payer: Medicare Other | Source: Ambulatory Visit | Attending: Internal Medicine | Admitting: Internal Medicine

## 2019-10-24 ENCOUNTER — Encounter (HOSPITAL_COMMUNITY)
Admission: RE | Admit: 2019-10-24 | Discharge: 2019-10-24 | Disposition: A | Discharge status: Home / Self Care | Payer: Medicare Other | Source: Ambulatory Visit | Attending: Internal Medicine | Admitting: Internal Medicine

## 2019-10-24 ENCOUNTER — Other Ambulatory Visit: Payer: Self-pay

## 2019-10-24 DIAGNOSIS — Z20822 Contact with and (suspected) exposure to covid-19: Secondary | ICD-10-CM | POA: Diagnosis not present

## 2019-10-24 DIAGNOSIS — Z01812 Encounter for preprocedural laboratory examination: Secondary | ICD-10-CM | POA: Diagnosis present

## 2019-10-25 LAB — SARS CORONAVIRUS 2 (TAT 6-24 HRS): SARS Coronavirus 2: NEGATIVE

## 2019-10-27 ENCOUNTER — Ambulatory Visit (HOSPITAL_COMMUNITY): Payer: Medicare Other | Admitting: Anesthesiology

## 2019-10-27 ENCOUNTER — Encounter (HOSPITAL_COMMUNITY)
Admission: RE | Disposition: A | Discharge status: Home / Self Care | Payer: Self-pay | Source: Home / Self Care | Attending: Internal Medicine

## 2019-10-27 ENCOUNTER — Ambulatory Visit (HOSPITAL_COMMUNITY)
Admission: RE | Admit: 2019-10-27 | Discharge: 2019-10-27 | Disposition: A | Discharge status: Home / Self Care | Payer: Medicare Other | Attending: Internal Medicine | Admitting: Internal Medicine

## 2019-10-27 ENCOUNTER — Other Ambulatory Visit: Payer: Self-pay

## 2019-10-27 ENCOUNTER — Encounter (HOSPITAL_COMMUNITY): Payer: Self-pay | Admitting: Internal Medicine

## 2019-10-27 DIAGNOSIS — Z8711 Personal history of peptic ulcer disease: Secondary | ICD-10-CM | POA: Insufficient documentation

## 2019-10-27 DIAGNOSIS — R635 Abnormal weight gain: Secondary | ICD-10-CM | POA: Diagnosis not present

## 2019-10-27 DIAGNOSIS — M1990 Unspecified osteoarthritis, unspecified site: Secondary | ICD-10-CM | POA: Diagnosis not present

## 2019-10-27 DIAGNOSIS — Z90710 Acquired absence of both cervix and uterus: Secondary | ICD-10-CM | POA: Diagnosis not present

## 2019-10-27 DIAGNOSIS — Z882 Allergy status to sulfonamides status: Secondary | ICD-10-CM | POA: Diagnosis not present

## 2019-10-27 DIAGNOSIS — K5909 Other constipation: Secondary | ICD-10-CM | POA: Diagnosis not present

## 2019-10-27 DIAGNOSIS — Z8269 Family history of other diseases of the musculoskeletal system and connective tissue: Secondary | ICD-10-CM | POA: Diagnosis not present

## 2019-10-27 DIAGNOSIS — M797 Fibromyalgia: Secondary | ICD-10-CM | POA: Diagnosis not present

## 2019-10-27 DIAGNOSIS — F17290 Nicotine dependence, other tobacco product, uncomplicated: Secondary | ICD-10-CM | POA: Diagnosis not present

## 2019-10-27 DIAGNOSIS — R197 Diarrhea, unspecified: Secondary | ICD-10-CM | POA: Insufficient documentation

## 2019-10-27 DIAGNOSIS — Z833 Family history of diabetes mellitus: Secondary | ICD-10-CM | POA: Insufficient documentation

## 2019-10-27 DIAGNOSIS — Z800 Family history of malignant neoplasm of digestive organs: Secondary | ICD-10-CM | POA: Diagnosis not present

## 2019-10-27 DIAGNOSIS — Z791 Long term (current) use of non-steroidal anti-inflammatories (NSAID): Secondary | ICD-10-CM | POA: Insufficient documentation

## 2019-10-27 DIAGNOSIS — R0781 Pleurodynia: Secondary | ICD-10-CM | POA: Insufficient documentation

## 2019-10-27 DIAGNOSIS — F329 Major depressive disorder, single episode, unspecified: Secondary | ICD-10-CM | POA: Diagnosis not present

## 2019-10-27 DIAGNOSIS — K449 Diaphragmatic hernia without obstruction or gangrene: Secondary | ICD-10-CM | POA: Insufficient documentation

## 2019-10-27 DIAGNOSIS — K2100 Gastro-esophageal reflux disease with esophagitis, without bleeding: Secondary | ICD-10-CM | POA: Diagnosis not present

## 2019-10-27 DIAGNOSIS — Z79899 Other long term (current) drug therapy: Secondary | ICD-10-CM | POA: Diagnosis not present

## 2019-10-27 DIAGNOSIS — Z888 Allergy status to other drugs, medicaments and biological substances status: Secondary | ICD-10-CM | POA: Insufficient documentation

## 2019-10-27 DIAGNOSIS — F419 Anxiety disorder, unspecified: Secondary | ICD-10-CM | POA: Insufficient documentation

## 2019-10-27 DIAGNOSIS — R110 Nausea: Secondary | ICD-10-CM | POA: Insufficient documentation

## 2019-10-27 DIAGNOSIS — Z8542 Personal history of malignant neoplasm of other parts of uterus: Secondary | ICD-10-CM | POA: Insufficient documentation

## 2019-10-27 DIAGNOSIS — R1011 Right upper quadrant pain: Secondary | ICD-10-CM | POA: Diagnosis present

## 2019-10-27 DIAGNOSIS — Z8379 Family history of other diseases of the digestive system: Secondary | ICD-10-CM | POA: Insufficient documentation

## 2019-10-27 HISTORY — PX: ESOPHAGOGASTRODUODENOSCOPY (EGD) WITH PROPOFOL: SHX5813

## 2019-10-27 SURGERY — ESOPHAGOGASTRODUODENOSCOPY (EGD) WITH PROPOFOL
Anesthesia: General

## 2019-10-27 MED ORDER — PROPOFOL 500 MG/50ML IV EMUL
INTRAVENOUS | Status: DC | PRN
  Administered 2019-10-27: 150 ug/kg/min via INTRAVENOUS

## 2019-10-27 MED ORDER — CHLORHEXIDINE GLUCONATE CLOTH 2 % EX PADS: 6.0000 | MEDICATED_PAD | Freq: Once | CUTANEOUS | Status: DC

## 2019-10-27 MED ORDER — LACTATED RINGERS IV SOLN: INTRAVENOUS | Status: DC | PRN

## 2019-10-27 MED ORDER — GLYCOPYRROLATE 0.2 MG/ML IJ SOLN
INTRAMUSCULAR | Status: DC | PRN
  Administered 2019-10-27: .1 mg via INTRAVENOUS

## 2019-10-27 MED ORDER — LIDOCAINE HCL (CARDIAC) PF 100 MG/5ML IV SOSY
PREFILLED_SYRINGE | INTRAVENOUS | Status: DC | PRN
  Administered 2019-10-27: 50 mg via INTRATRACHEAL

## 2019-10-27 MED ORDER — KETAMINE HCL 50 MG/5ML IJ SOSY
PREFILLED_SYRINGE | INTRAMUSCULAR | Status: AC
  Filled 2019-10-27: qty 5

## 2019-10-27 MED ORDER — LIDOCAINE VISCOUS HCL 2 % MT SOLN
15.0000 mL | Freq: Once | OROMUCOSAL | Status: AC
  Administered 2019-10-27: 12:00:00 15 mL via OROMUCOSAL

## 2019-10-27 MED ORDER — LACTATED RINGERS IV SOLN
Freq: Once | INTRAVENOUS | Status: AC
  Administered 2019-10-27: 12:00:00 1000 mL via INTRAVENOUS

## 2019-10-27 MED ORDER — KETAMINE HCL 10 MG/ML IJ SOLN
INTRAMUSCULAR | Status: DC | PRN
Start: 2019-10-27 — End: 2019-10-27
  Administered 2019-10-27: 30 mg via INTRAVENOUS

## 2019-10-27 MED ORDER — LIDOCAINE VISCOUS HCL 2 % MT SOLN
OROMUCOSAL | Status: AC
  Filled 2019-10-27: qty 15

## 2019-10-27 NOTE — Op Note (Signed)
[facility] Patient Name: Susan Baker Procedure Date: 10/27/2019 11:56 AM MRN: [id number] Date of Birth: 08-Nov-1960 Attending MD: Norvel Richards , MD CSN: [id number] Age: 59 Admit Type: Outpatient Procedure:                Upper GI endoscopy Indications:              Abdominal pain in the right upper quadrant Providers:                Norvel Richards, MD, Otis Peak B. Sharon Seller, RN,                            Randa Spike, Technician Referring MD:              Medicines:                Propofol per Anesthesia Complications:            No immediate complications. Estimated Blood Loss:     Estimated blood loss: none. Procedure:                Pre-Anesthesia Assessment:                           - Prior to the procedure, a History and Physical                            was performed, and patient medications and                            allergies were reviewed. The patient's tolerance of                            previous anesthesia was also reviewed. The risks                            and benefits of the procedure and the sedation                            options and risks were discussed with the patient.                            All questions were answered, and informed consent                            was obtained. Prior Anticoagulants: The patient has                            taken no previous anticoagulant or antiplatelet                            agents. ASA Grade Assessment: II - A patient with                            mild systemic disease. After reviewing the risks  and benefits, the patient was deemed in                            satisfactory condition to undergo the procedure.                           After obtaining informed consent, the endoscope was                            passed under direct vision. Throughout the                            procedure, the patient's blood pressure, pulse, and                             oxygen saturations were monitored continuously. The                            [id number]) scope was introduced through the                            mouth, and advanced to the third part of duodenum.                            The upper GI endoscopy was accomplished without                            difficulty. The patient tolerated the procedure                            well. Scope In: 12:42:38 PM Scope Out: 12:46:37 PM Total Procedure Duration: 0 hours 3 minutes 59 seconds  Findings:      The examined esophagus was normal.      The entire examined stomach was normal.      The duodenal bulb, second portion of the duodenum and third portion of       the duodenum were normal. Impression:               - Normal esophagus.                           - Normal stomach.                           - Normal duodenal bulb, second portion of the                            duodenum and third portion of the duodenum.                           - No specimens collected. Patient's right upper                            quadrant abdominal pain atypical for gallbladder  disease. Fatty meal challenge did reproduce her                            symptoms, however. She has noted to have some                            diminution in gallbladder EF over time but it                            remains well within normal limits. It remains to be                            seen how much, if any, a musculoskeletal component                            / fibromyalgia may be contributing here. It is                            notable she is never had a rash in the distribution                            of her abdominal discomfort. Algorithm was to                            obtain a CT scan next. However, her appointment has                            already been set up tomorrow to see Dr. Constance Haw. We                            will go ahead and encourage her to keep that                             appointment. We will hear from Dr. Constance Haw and go                            from there. Moderate Sedation:      Moderate (conscious) sedation was personally administered by an       anesthesia professional. The following parameters were monitored: oxygen       saturation, heart rate, blood pressure, respiratory rate, EKG, adequacy       of pulmonary ventilation, and response to care. Recommendation:            Procedure Code(s):        --- Professional ---                           [phone], Esophagogastroduodenoscopy, flexible,                            transoral; diagnostic, including collection of                            specimen(s) by  brushing or washing, when performed                            (separate procedure) Diagnosis Code(s):        --- Professional ---                           R10.11, Right upper quadrant pain CPT copyright 2019 [employer]. All rights reserved. The codes documented in this report are preliminary and upon coder review may  be revised to meet current compliance requirements. [name] Osman Calzadilla, MD Norvel Richards, MD 10/27/2019 12:57:05 PM This report has been signed electronically. Number of Addenda: 0

## 2019-10-27 NOTE — Anesthesia Preprocedure Evaluation (Signed)
Anesthesia Evaluation  Patient identified by MRN, date of birth, ID band Patient awake    Reviewed: Allergy & Precautions, NPO status , Patient's Chart, lab work & pertinent test results  History of Anesthesia Complications Negative for: history of anesthetic complications  Airway Mallampati: II  TM Distance: >3 FB Neck ROM: Full    Dental  (+) Dental Advisory Given   Pulmonary Current SmokerPatient did not abstain from smoking.,    Pulmonary exam normal breath sounds clear to auscultation       Cardiovascular Exercise Tolerance: Good negative cardio ROS Normal cardiovascular exam Rhythm:Regular Rate:Normal     Neuro/Psych PSYCHIATRIC DISORDERS Anxiety Depression  Neuromuscular disease    GI/Hepatic hiatal hernia, PUD, GERD  Medicated,  Endo/Other  negative endocrine ROS  Renal/GU Renal InsufficiencyRenal disease  negative genitourinary   Musculoskeletal  (+) Arthritis , Fibromyalgia -, narcotic dependentBack pain   Abdominal   Peds negative pediatric ROS (+)  Hematology negative hematology ROS (+)   Anesthesia Other Findings   Reproductive/Obstetrics                           Anesthesia Physical Anesthesia Plan  ASA: III  Anesthesia Plan: General   Post-op Pain Management:    Induction: Intravenous  PONV Risk Score and Plan: 2 and Treatment may vary due to age or medical condition  Airway Management Planned: Nasal Cannula and Natural Airway  Additional Equipment:   Intra-op Plan:   Post-operative Plan:   Informed Consent: I have reviewed the patients History and Physical, chart, labs and discussed the procedure including the risks, benefits and alternatives for the proposed anesthesia with the patient or authorized representative who has indicated his/her understanding and acceptance.     Dental advisory given  Plan Discussed with: CRNA and Surgeon  Anesthesia Plan  Comments:         Anesthesia Quick Evaluation

## 2019-10-27 NOTE — Transfer of Care (Signed)
Immediate Anesthesia Transfer of Care Note  Patient: Susan Baker  Procedure(s) Performed: ESOPHAGOGASTRODUODENOSCOPY (EGD) WITH PROPOFOL (N/A )  Patient Location: PACU  Anesthesia Type:General  Level of Consciousness: awake  Airway & Oxygen Therapy: Patient Spontanous Breathing  Post-op Assessment: Report given to RN and Post -op Vital signs reviewed and stable  Post vital signs: Reviewed and stable  Last Vitals:  Vitals Value Taken Time  BP    Temp    Pulse    Resp 16 10/27/19 1254  SpO2    Vitals shown include unvalidated device data.  Last Pain:  Vitals:   10/27/19 1237  TempSrc:   PainSc: 6       Patients Stated Pain Goal: 6 ([id number])  Complications: No apparent anesthesia complications

## 2019-10-27 NOTE — Anesthesia Postprocedure Evaluation (Signed)
Anesthesia Post Note  Patient: Susan Baker  Procedure(s) Performed: ESOPHAGOGASTRODUODENOSCOPY (EGD) WITH PROPOFOL (N/A )  Patient location during evaluation: PACU Anesthesia Type: General Level of consciousness: awake Pain management: pain level controlled Vital Signs Assessment: post-procedure vital signs reviewed and stable Respiratory status: spontaneous breathing Cardiovascular status: stable Postop Assessment: no apparent nausea or vomiting Anesthetic complications: no     Last Vitals:  Vitals:   10/27/19 1208  BP: (!) 138/109  Pulse: 67  Resp: 20  Temp: 36.9 C  SpO2: 97%    Last Pain:  Vitals:   10/27/19 1237  TempSrc:   PainSc: [place]

## 2019-10-27 NOTE — Discharge Instructions (Signed)
EGD Discharge instructions Please read the instructions outlined below and refer to this sheet in the next few weeks. These discharge instructions provide you with general information on caring for yourself after you leave the hospital. Your doctor may also give you specific instructions. While your treatment has been planned according to the most current medical practices available, unavoidable complications occasionally occur. If you have any problems or questions after discharge, please call your doctor. ACTIVITY  You may resume your regular activity but move at a slower pace for the next 24 hours.   Take frequent rest periods for the next 24 hours.   Walking will help expel (get rid of) the air and reduce the bloated feeling in your abdomen.   No driving for 24 hours (because of the anesthesia (medicine) used during the test).   You may shower.   Do not sign any important legal documents or operate any machinery for 24 hours (because of the anesthesia used during the test).  NUTRITION  Drink plenty of fluids.   You may resume your normal diet.   Begin with a light meal and progress to your normal diet.   Avoid alcoholic beverages for 24 hours or as instructed by your caregiver.  MEDICATIONS  You may resume your normal medications unless your caregiver tells you otherwise.  WHAT YOU CAN EXPECT TODAY  You may experience abdominal discomfort such as a feeling of fullness or "gas" pains.  FOLLOW-UP  Your doctor will discuss the results of your test with you.  SEEK IMMEDIATE MEDICAL ATTENTION IF ANY OF THE FOLLOWING OCCUR:  Excessive nausea (feeling sick to your stomach) and/or vomiting.   Severe abdominal pain and distention (swelling).   Trouble swallowing.   Temperature over 101 F (37.8 C).   Rectal bleeding or vomiting of blood.   EGD was normal today.  Keep appointment with Dr. Constance Haw tomorrow.  At patient request, spoke to Rosana Fret at [phone]

## 2019-10-27 NOTE — Interval H&P Note (Signed)
History and Physical Interval Note:  10/27/2019 12:26 PM  Susan Baker  has presented today for surgery, with the diagnosis of RUQ pain.  The various methods of treatment have been discussed with the patient and family. After consideration of risks, benefits and other options for treatment, the patient has consented to  Procedure(s) with comments: ESOPHAGOGASTRODUODENOSCOPY (EGD) WITH PROPOFOL (N/A) - 1:00PM as a surgical intervention.  The patient's history has been reviewed, patient examined, no change in status, stable for surgery.  I have reviewed the patient's chart and labs.  Questions were answered to the patient's satisfaction.     Susan Baker  No dysphagia.  Diagnostic EGD per plan.The risks, benefits, limitations, alternatives and imponderables have been reviewed with the patient. Potential for esophageal dilation, biopsy, etc. have also been reviewed.  Questions have been answered. All parties agreeable.

## 2019-10-28 ENCOUNTER — Encounter: Payer: Self-pay | Admitting: General Surgery

## 2019-10-28 ENCOUNTER — Other Ambulatory Visit: Payer: Self-pay

## 2019-10-28 ENCOUNTER — Ambulatory Visit (INDEPENDENT_AMBULATORY_CARE_PROVIDER_SITE_OTHER): Payer: Medicare Other | Admitting: General Surgery

## 2019-10-28 VITALS — BP 118/83 | HR 73 | Temp 97.7°F | Resp 12 | Ht 62.5 in | Wt 157.0 lb

## 2019-10-28 DIAGNOSIS — K828 Other specified diseases of gallbladder: Secondary | ICD-10-CM | POA: Insufficient documentation

## 2019-10-28 NOTE — Patient Instructions (Signed)
Biliary Dyskinesia  Biliary dyskinesia is a condition in which the gallbladder or bile ducts cannot release or move bile normally. Bile is a fluid that is made in the liver. It helps the body to digest food. Bile flows to the gallbladder to be stored. When bile is needed for digestion, it leaves the gallbladder and flows through the bile ducts into the digestive tract. Biliary dyskinesia causes bile to build up, and that can cause abdomen (abdominal)pain. This condition may also be called:  Acalculous cholecystopathy.  Functional gallbladder disorder.  Sphincter of Oddi dysfunction. All these names describe gallbladder disorders that are not caused by gallbladder stones. Sphincter of Oddi dysfunction happens in the area where the duct for digestive juices from the pancreas (pancreatic duct) joins the bile duct before emptying into the digestive tract. If the opening into the digestive tract is too small, bile and pancreatic juices may back up and cause abdominal pain. What are the causes? The cause of biliary dyskinesia is poor function of the gallbladder. The exact reason this happens is often unknown. One reason may be changes in the gallbladder that are caused by obesity. What increases the risk? You are more likely to develop this condition if your mother or father had it, or if you are:  Overweight.  Female.  57-68 years old. What are the signs or symptoms? The main symptom of this condition is pain in the upper right side of the abdomen. Typically, the pain:  Starts about 30 minutes after a meal, especially a meal that is spicy or greasy.  Lasts for 30 minutes or longer.  Builds up gradually until it is a steady pain that is severe enough to interrupt daily activities. Other symptoms may include:  Nausea.  Vomiting.  Cramping.  Bloating.  Heartburn.  Diarrhea. How is this diagnosed? This condition may be diagnosed based on your symptoms, your medical history, and a  physical exam. You may have tests to rule out other conditions and to confirm the diagnosis. These may include:  Blood tests.  Ultrasound tests of the gallbladder.  HIDA scan. This is an X-ray test that can show if your gallbladder empties less than a normal amount of bile (gallbladder ejection fraction).  MRI or CT scan of the abdomen.  ERCP (endoscopic retrograde cholangiopancreatogram). During this procedure, a thin tube with a camera on the end is inserted into the throat and down into areas that need examination, such as the pancreas, bile ducts, liver, and gallbladder. Dye is injected into your blood, then X-rays are done. The dye helps your health care provider to see the areas that need examination. ERCP may be done to help diagnose sphincter of Oddi dysfunction. How is this treated? Treatment depends on the cause. The first step of treatment is usually to make lifestyle changes. Your health care provider may recommend:  Taking over-the-counter or prescription pain medicine.  Resting.  Losing weight.  Avoiding foods that are spicy or greasy. In some cases, the condition gets better with lifestyle changes only. However, in many cases, surgery to remove the gallbladder (cholecystectomy) is necessary. Follow these instructions at home:  Take over-the-counter and prescription medicines only as told by your health care provider.  Do not drive or use heavy machinery while taking prescription pain medicine.  If you are taking prescription pain medicine, take actions to prevent or treat constipation. Your health care provider may recommend that you: ? Drink enough fluid to keep your urine pale yellow. ? Eat foods that  are high in fiber, such as fresh fruits and vegetables, whole grains, and beans. ? Limit foods that are high in fat and processed sugars, such as fried or sweet foods. ? Take an over-the-counter or prescription medicine for constipation.  Rest and return to your normal  activities as told by your health care provider. Ask your health care provider what activities are safe for you.  Follow instructions from your health care provider about eating or drinking restrictions. You may need to limit fatty, greasy, and spicy foods if they cause symptoms.  Limit alcohol intake to no more than 1 drink a day for nonpregnant women and 2 drinks a day for men. One drink equals 12 oz of beer, 5 oz of wine, or 1 oz of hard liquor. Alcohol can irritate your stomach and your liver.  Do not use any products that contain nicotine or tobacco, such as cigarettes and e-cigarettes. If you need help quitting, ask your health care provider. Smoking can damage your digestive system.  Keep all follow-up visits as told by your health care provider. This is important. Contact a health care provider if:  Your abdominal pain comes back.  You have any of the following: ? Nausea. ? Vomiting. ? Diarrhea. ? Cramping. ? Bloating. ? Diarrhea. Summary  Biliary dyskinesia is a condition in which your gallbladder or bile ducts cannot release or move bile normally.  The main symptom of this condition is pain in the upper right side of the abdomen. The pain typically starts about 30 minutes after a meal, especially a meal that is spicy or greasy.  Treatment may include lifestyle changes, such as working to lose weight and avoiding certain foods. In many cases, surgery to remove the gallbladder (cholecystectomy) is necessary. This information is not intended to replace advice given to you by your health care provider. Make sure you discuss any questions you have with your health care provider. Document Revised: 06/01/2017 Document Reviewed: 03/22/2017 Elsevier Patient Education  [place].   Laparoscopic Cholecystectomy Laparoscopic cholecystectomy is surgery to remove the gallbladder. The gallbladder is a pear-shaped organ that lies beneath the liver on the right side of the body.  The gallbladder stores bile, which is a fluid that helps the body to digest fats. Cholecystectomy is often done for inflammation of the gallbladder (cholecystitis). This condition is usually caused by a buildup of gallstones (cholelithiasis) in the gallbladder. Gallstones can block the flow of bile, which can result in inflammation and pain. In severe cases, emergency surgery may be required. This procedure is done though small incisions in your abdomen (laparoscopic surgery). A thin scope with a camera (laparoscope) is inserted through one incision. Thin surgical instruments are inserted through the other incisions. In some cases, a laparoscopic procedure may be turned into a type of surgery that is done through a larger incision (open surgery). Tell a health care provider about:  Any allergies you have.  All medicines you are taking, including vitamins, herbs, eye drops, creams, and over-the-counter medicines.  Any problems you or family members have had with anesthetic medicines.  Any blood disorders you have.  Any surgeries you have had.  Any medical conditions you have.  Whether you are pregnant or may be pregnant. What are the risks? Generally, this is a safe procedure. However, problems may occur, including:  Infection.  Bleeding.  Allergic reactions to medicines.  Damage to other structures or organs.  A stone remaining in the common bile duct. The common bile duct  carries bile from the gallbladder into the small intestine.  A bile leak from the cyst duct that is clipped when your gallbladder is removed. Medicines  Ask your health care provider about: ? Changing or stopping your regular medicines. This is especially important if you are taking diabetes medicines or blood thinners. ? Taking medicines such as aspirin and ibuprofen. These medicines can thin your blood. Do not take these medicines before your procedure if your health care provider instructs you not to.  You  may be given antibiotic medicine to help prevent infection. General instructions  Let your health care provider know if you develop a cold or an infection before surgery.  Plan to have someone take you home from the hospital or clinic.  Ask your health care provider how your surgical site will be marked or identified. What happens during the procedure?   To reduce your risk of infection: ? Your health care team will wash or sanitize their hands. ? Your skin will be washed with soap. ? Hair may be removed from the surgical area.  An IV tube may be inserted into one of your veins.  You will be given one or more of the following: ? A medicine to help you relax (sedative). ? A medicine to make you fall asleep (general anesthetic).  A breathing tube will be placed in your mouth.  Your surgeon will make several small cuts (incisions) in your abdomen.  The laparoscope will be inserted through one of the small incisions. The camera on the laparoscope will send images to a TV screen (monitor) in the operating room. This lets your surgeon see inside your abdomen.  Air-like gas will be pumped into your abdomen. This will expand your abdomen to give the surgeon more room to perform the surgery.  Other tools that are needed for the procedure will be inserted through the other incisions. The gallbladder will be removed through one of the incisions.  Your common bile duct may be examined. If stones are found in the common bile duct, they may be removed.  After your gallbladder has been removed, the incisions will be closed with stitches (sutures), staples, or skin glue.  Your incisions may be covered with a bandage (dressing). The procedure may vary among health care providers and hospitals. What happens after the procedure?  Your blood pressure, heart rate, breathing rate, and blood oxygen level will be monitored until the medicines you were given have worn off.  You will be given  medicines as needed to control your pain.  Do not drive for 24 hours if you were given a sedative. This information is not intended to replace advice given to you by your health care provider. Make sure you discuss any questions you have with your health care provider. Document Revised: 06/01/2017 Document Reviewed: 12/06/2015 Elsevier Patient Education  [place].

## 2019-10-29 NOTE — Progress Notes (Signed)
[facility] History and Physical  Reason for Referral: RUQ pain, ? Gallbladder  Referring Physician:  Roseanne Kaufman, NP   Chief Complaint    New Patient (Initial Visit)      Susan Baker is a 59 y.o. female.  HPI: Susan Baker is a 59 yo who has a history of fibromyalgia, chronic back and neck pain with bulging disc on chronic pain medication. She has been worked up for right sided pain by GI and has undergone a EGD this week.  She has been worked up in the past for right sided pain in 2012 with a normal [place], HIDA scan.  She has undergone repeat US and HIDA this time. She has no evidence of stones but did have pain with the US probe and had a reproduction of her pain with the Ensure drink.  EGD was unremarkable this week and she had prior signs of esophagitis.  At this time she is being referred for possible cholecystectomy.  She says that she has recently had issues with her pain physician ending their contract due to a urine test that came back with "codeine." She is on Roxicodone at baseline. She says she is in the process of getting another pain physician and has been in contact with her PCP Dr. Maudie Mercury. She currently is using her last prescription of roxicodone and has some of these available.   The pain she describes is in the right upper quadrant and she can point the the area. She says it is constant and stabbing. The pain can radiate around to her back. She says this is not like any other fibromyalgia pain or muscle pain she has ever experienced and is more deep in nature.   She reports some issues with IBS and diarrhea/ constipation, and is followed by GI.   Past Medical History:  Diagnosis Date  . Abnormal Pap smear   . Abnormal ThinPrep Pap test of vagina 04/25/2017   LSIL will get colp__________  . Anxiety   . Bulging disc    cervical, [facility]  . Depression 02/18/2013  . Fibromyalgia   . GERD (gastroesophageal reflux disease)    egd with RE per patient, remote   . Hematuria 05/14/2014  . History of abnormal cervical Pap smear 02/24/2014  . Osteoarthritis (arthritis due to wear and tear of joints)   . Osteoporosis   . Osteoporosis, unspecified 03/03/2014  . Postcoital bleeding 09/15/2015  . Postmenopausal 02/24/2014  . PUD (peptic ulcer disease)    bleeding PUD per patient, remote, On Daypro  . Urinary frequency 05/14/2014  . Uterine cancer ([place])   . Vaginal Pap smear, abnormal     Past Surgical History:  Procedure Laterality Date  . COLONOSCOPY WITH PROPOFOL N/A 11/22/2015   Dr.Rourk- the entire examined colon is normal, the examined portion of the ileum was normal, non-bleeding internal hemorrhoids. no specimens collected.   Marland Kitchen ECTOPIC PREGNANCY SURGERY     twice  . ESOPHAGEAL DILATION N/A 11/22/2015   Procedure: ESOPHAGEAL DILATION;  Surgeon: Daneil Dolin, MD;  Location: [facility];  Service: Endoscopy;  Laterality: N/A;  . ESOPHAGOGASTRODUODENOSCOPY  12/01/2010   Dr. Gala Romney: mild distal ERE, antral erosions due to NSAIDS, no H.Pylori  . ESOPHAGOGASTRODUODENOSCOPY (EGD) WITH PROPOFOL N/A 11/22/2015   Dr.Rourk- grade A esophagitis, dilated. small hiatal hernia, normal stomach, normal second portion of the duodenum.  . hysterectomy for uterine cancer     partial    Family History  Problem Relation Age of  Onset  . Cirrhosis Father        etoh, died with liver cancer  . Diabetes Father   . Cirrhosis Paternal Uncle        multiple, etoh  . Pancreatic cancer Paternal Grandmother   . Diabetes Paternal Grandmother   . Kidney failure Mother   . Diabetes Mother   . Other Brother        crohn's disease  . Hyperlipidemia Brother   . Fibromyalgia Daughter   . Diabetes Maternal Grandmother   . Healthy Son   . Colon cancer Maternal Uncle     Social History   Tobacco Use  . Smoking status: Current Some Day Smoker    Packs/day: 0.50    Years: 31.00    Pack years: 15.50    Types: E-cigarettes  . Smokeless tobacco: Never Used  . Tobacco  comment: using vape at times  Substance Use Topics  . Alcohol use: Yes    Alcohol/week: 0.0 standard drinks    Comment: occasional social use   . Drug use: No    Medications: I have reviewed the patient's current medications. Allergies as of 10/28/2019      Reactions   Cymbalta [duloxetine Hcl] Other (See Comments)   Depression, anxiety, wt gain   Daypro [oxaprozin] Other (See Comments)   Ulcers   Sulfa Antibiotics Other (See Comments)   Bactrim-Pt said she just gets crazy with this   Trazodone And Nefazodone Other (See Comments)   Pt said she just gets crazy with this   Nucynta [tapentadol] Other (See Comments)   Memory loss and slurred speech      Medication List       Accurate as of October 28, 2019 11:59 PM. If you have any questions, ask your nurse or doctor.        FLUoxetine 40 MG capsule Commonly known as: PROZAC Take 40 mg by mouth 2 (two) times daily.   ondansetron 4 MG tablet Commonly known as: ZOFRAN Take 1 tablet (4 mg total) by mouth every 8 (eight) hours as needed for nausea or vomiting.   OVER THE COUNTER MEDICATION Place 1 drop under the tongue daily as needed (pain).   Oxycodone HCl 10 MG Tabs Take 10 mg by mouth 4 (four) times daily as needed (pain).   pantoprazole 40 MG tablet Commonly known as: PROTONIX Take 1 tablet (40 mg total) by mouth daily. Take 30 minutes before breakfast What changed:   when to take this  additional instructions   pregabalin 150 MG capsule Commonly known as: LYRICA Take 150 mg by mouth 2 (two) times daily.        ROS:  A comprehensive review of systems was negative except for: Gastrointestinal: positive for abdominal pain, constipation, diarrhea, reflux symptoms and bloating and gas Musculoskeletal: positive for back pain, neck pain and joint pain  Blood pressure 118/83, pulse 73, temperature 97.7 F (36.5 C), temperature source Oral, resp. rate 12, height 5' 2.5" (1.588 m), weight 157 lb (71.2 kg), SpO2 98  %. Physical Exam Vitals reviewed.  Constitutional:      Appearance: She is normal weight.  HENT:     Head: Normocephalic and atraumatic.     Nose: Nose normal.     Mouth/Throat:     Mouth: Mucous membranes are moist.  Eyes:     Extraocular Movements: Extraocular movements intact.     Pupils: Pupils are equal, round, and reactive to light.  Cardiovascular:     Rate and  Rhythm: Normal rate and regular rhythm.  Pulmonary:     Effort: Pulmonary effort is normal.     Breath sounds: Normal breath sounds.  Abdominal:     General: There is no distension.     Palpations: Abdomen is soft.     Tenderness: There is abdominal tenderness in the right upper quadrant.  Musculoskeletal:        General: No swelling. Normal range of motion.     Cervical back: Normal range of motion.  Skin:    General: Skin is warm.  Neurological:     General: No focal deficit present.     Mental Status: She is alert and oriented to person, place, and time.  Psychiatric:        Mood and Affect: Mood normal.        Behavior: Behavior normal.        Thought Content: Thought content normal.        Judgment: Judgment normal.     Results: HIDA 10/2019  CLINICAL DATA:  Nausea and RIGHT upper quadrant abdominal pain for several weeks  EXAM: NUCLEAR MEDICINE HEPATOBILIARY IMAGING WITH GALLBLADDER EF  TECHNIQUE: Sequential images of the abdomen were obtained out to 60 minutes following intravenous administration of radiopharmaceutical. After oral ingestion of Ensure, gallbladder ejection fraction was determined. At 60 min, normal ejection fraction is greater than 33%.  RADIOPHARMACEUTICALS:  5.1 mCi Tc-87m  Choletec IV  COMPARISON:  11/09/2010  FINDINGS: Normal tracer extraction from bloodstream indicating normal hepatocellular function.  Normal excretion of tracer into biliary tree.  Gallbladder visualized at 10 min.  Small bowel visualized at 40 min.  No hepatic retention of  tracer.  Subjectively normal emptying of tracer from gallbladder following fatty meal stimulation.  Calculated gallbladder ejection fraction is 65%, normal.  Patient reported abdominal pain and nausea following Ensure ingestion.  Normal gallbladder ejection fraction following Ensure ingestion is greater than 33% at 1 hour.  IMPRESSION: Patent biliary tree with normal gallbladder ejection fraction of 65% following fatty meal stimulation.  Patient reported abdominal pain and nausea following Ensure ingestion.   Electronically Signed   By: Lavonia Dana M.D.   On: 10/10/2019 16:35  [place] - 09/2019 CLINICAL DATA:  Right upper quadrant and left upper quadrant pain.  EXAM: ABDOMEN ULTRASOUND COMPLETE  COMPARISON:  MRI abdomen 11/08/2010  FINDINGS: Gallbladder: No gallstones or wall thickening visualized. No sonographic Murphy sign noted by sonographer.  Common bile duct: Diameter: 3.5 mm  Liver: Increased parenchymal echogenicity. No focal abnormality. Portal vein is patent on color Doppler imaging with normal direction of blood flow towards the liver.  IVC: No abnormality visualized.  Pancreas: Visualized portion unremarkable.  Spleen: Size and appearance within normal limits.  Right Kidney: Length: 10.3 cm. Echogenicity within normal limits. No mass or hydronephrosis visualized.  Left Kidney: Length: 10.3 cm. Complex cystic lesion with internal echogenic focus within upper pole left kidney measures 2 x 1.4 x 2.0 cm. 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 Echogenicity within normal limits. No mass or hydronephrosis visualized.  Abdominal aorta: No aneurysm visualized.  Other findings: None.  IMPRESSION: 1. No acute abnormality. 2. Echogenic liver compatible with hepatic steatosis. 3. Again seen is a complex cystic structure  with internal echogenic foci localizing to upper pole of left kidney. 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   Electronically Signed  By: Kerby Moors M.D.   On: 10/01/2019 15:11  Assessment & Plan:  JEHAN GERRISH is a 59 y.o. female with some degree of chronic cholecystitis versus biliary dyskinesia with pain on ingestion of the Ensure. Her gallbladder EF is trending down from 2012. Given these findings, and the constant pain it is reasonable to proceed with cholecystectomy. I warned that not all of her symptoms would improve and some could be from her IBS.   -PLAN: I counseled the patient about the indication, risks and benefits of laparoscopic cholecystectomy.  She understands there is a very small chance for bleeding, infection, injury to normal structures (including common bile duct), conversion to open surgery, persistent symptoms, evolution of postcholecystectomy diarrhea, need for secondary interventions, anesthesia reaction, cardiopulmonary issues and other risks not specifically detailed here. I described the expected recovery, the plan for follow-up and the restrictions during the recovery phase.  All questions were answered.   -Discussed COVID testing preop -Discussed that she has pain medication currently and I will only be able to prescribe a limited quantity if she has insufficient pills at the time of surgery. She is understanding.   All questions were answered to the satisfaction of the patient.    Susan Baker 10/29/2019, 10:45 AM

## 2019-10-29 NOTE — H&P (Signed)
[facility] History and Physical  Reason for Referral: RUQ pain, ? Gallbladder  Referring Physician:  Roseanne Kaufman, NP   Chief Complaint    New Patient (Initial Visit)      Susan Baker is a 59 y.o. female.  HPI: Ms. Mclamb is a 59 yo who has a history of fibromyalgia, chronic back and neck pain with bulging disc on chronic pain medication. She has been worked up for right sided pain by GI and has undergone a EGD this week.  She has been worked up in the past for right sided pain in 2012 with a normal [place], HIDA scan.  She has undergone repeat US and HIDA this time. She has no evidence of stones but did have pain with the US probe and had a reproduction of her pain with the Ensure drink.  EGD was unremarkable this week and she had prior signs of esophagitis.  At this time she is being referred for possible cholecystectomy.  She says that she has recently had issues with her pain physician ending their contract due to a urine test that came back with "codeine." She is on Roxicodone at baseline. She says she is in the process of getting another pain physician and has been in contact with her PCP Dr. Maudie Mercury. She currently is using her last prescription of roxicodone and has some of these available.   The pain she describes is in the right upper quadrant and she can point the the area. She says it is constant and stabbing. The pain can radiate around to her back. She says this is not like any other fibromyalgia pain or muscle pain she has ever experienced and is more deep in nature.   She reports some issues with IBS and diarrhea/ constipation, and is followed by GI.   Past Medical History:  Diagnosis Date  . Abnormal Pap smear   . Abnormal ThinPrep Pap test of vagina 04/25/2017   LSIL will get colp__________  . Anxiety   . Bulging disc    cervical, [facility]  . Depression 02/18/2013  . Fibromyalgia   . GERD (gastroesophageal reflux disease)    egd with RE per patient, remote   . Hematuria 05/14/2014  . History of abnormal cervical Pap smear 02/24/2014  . Osteoarthritis (arthritis due to wear and tear of joints)   . Osteoporosis   . Osteoporosis, unspecified 03/03/2014  . Postcoital bleeding 09/15/2015  . Postmenopausal 02/24/2014  . PUD (peptic ulcer disease)    bleeding PUD per patient, remote, On Daypro  . Urinary frequency 05/14/2014  . Uterine cancer ([place])   . Vaginal Pap smear, abnormal     Past Surgical History:  Procedure Laterality Date  . COLONOSCOPY WITH PROPOFOL N/A 11/22/2015   Dr.Rourk- the entire examined colon is normal, the examined portion of the ileum was normal, non-bleeding internal hemorrhoids. no specimens collected.   Marland Kitchen ECTOPIC PREGNANCY SURGERY     twice  . ESOPHAGEAL DILATION N/A 11/22/2015   Procedure: ESOPHAGEAL DILATION;  Surgeon: Daneil Dolin, MD;  [facility];  Service: Endoscopy;  Laterality: N/A;  . ESOPHAGOGASTRODUODENOSCOPY  12/01/2010   Dr. Gala Romney: mild distal ERE, antral erosions due to NSAIDS, no H.Pylori  . ESOPHAGOGASTRODUODENOSCOPY (EGD) WITH PROPOFOL N/A 11/22/2015   Dr.Rourk- grade A esophagitis, dilated. small hiatal hernia, normal stomach, normal second portion of the duodenum.  . hysterectomy for uterine cancer     partial    Family History  Problem Relation Age of  Onset  . Cirrhosis Father        etoh, died with liver cancer  . Diabetes Father   . Cirrhosis Paternal Uncle        multiple, etoh  . Pancreatic cancer Paternal Grandmother   . Diabetes Paternal Grandmother   . Kidney failure Mother   . Diabetes Mother   . Other Brother        crohn's disease  . Hyperlipidemia Brother   . Fibromyalgia Daughter   . Diabetes Maternal Grandmother   . Healthy Son   . Colon cancer Maternal Uncle     Social History   Tobacco Use  . Smoking status: Current Some Day Smoker    Packs/day: 0.50    Years: 31.00    Pack years: 15.50    Types: E-cigarettes  . Smokeless tobacco: Never Used  . Tobacco  comment: using vape at times  Substance Use Topics  . Alcohol use: Yes    Alcohol/week: 0.0 standard drinks    Comment: occasional social use   . Drug use: No    Medications: I have reviewed the patient's current medications. Allergies as of 10/28/2019      Reactions   Cymbalta [duloxetine Hcl] Other (See Comments)   Depression, anxiety, wt gain   Daypro [oxaprozin] Other (See Comments)   Ulcers   Sulfa Antibiotics Other (See Comments)   Bactrim-Pt said she just gets crazy with this   Trazodone And Nefazodone Other (See Comments)   Pt said she just gets crazy with this   Nucynta [tapentadol] Other (See Comments)   Memory loss and slurred speech      Medication List       Accurate as of October 28, 2019 11:59 PM. If you have any questions, ask your nurse or doctor.        FLUoxetine 40 MG capsule Commonly known as: PROZAC Take 40 mg by mouth 2 (two) times daily.   ondansetron 4 MG tablet Commonly known as: ZOFRAN Take 1 tablet (4 mg total) by mouth every 8 (eight) hours as needed for nausea or vomiting.   OVER THE COUNTER MEDICATION Place 1 drop under the tongue daily as needed (pain).   Oxycodone HCl 10 MG Tabs Take 10 mg by mouth 4 (four) times daily as needed (pain).   pantoprazole 40 MG tablet Commonly known as: PROTONIX Take 1 tablet (40 mg total) by mouth daily. Take 30 minutes before breakfast What changed:   when to take this  additional instructions   pregabalin 150 MG capsule Commonly known as: LYRICA Take 150 mg by mouth 2 (two) times daily.        ROS:  A comprehensive review of systems was negative except for: Gastrointestinal: positive for abdominal pain, constipation, diarrhea, reflux symptoms and bloating and gas Musculoskeletal: positive for back pain, neck pain and joint pain  Blood pressure 118/83, pulse 73, temperature 97.7 F (36.5 C), temperature source Oral, resp. rate 12, height 5' 2.5" (1.588 m), weight 157 lb (71.2 kg), SpO2 98  %. Physical Exam Vitals reviewed.  Constitutional:      Appearance: She is normal weight.  HENT:     Head: Normocephalic and atraumatic.     Nose: Nose normal.     Mouth/Throat:     Mouth: Mucous membranes are moist.  Eyes:     Extraocular Movements: Extraocular movements intact.     Pupils: Pupils are equal, round, and reactive to light.  Cardiovascular:     Rate and  Rhythm: Normal rate and regular rhythm.  Pulmonary:     Effort: Pulmonary effort is normal.     Breath sounds: Normal breath sounds.  Abdominal:     General: There is no distension.     Palpations: Abdomen is soft.     Tenderness: There is abdominal tenderness in the right upper quadrant.  Musculoskeletal:        General: No swelling. Normal range of motion.     Cervical back: Normal range of motion.  Skin:    General: Skin is warm.  Neurological:     General: No focal deficit present.     Mental Status: She is alert and oriented to person, place, and time.  Psychiatric:        Mood and Affect: Mood normal.        Behavior: Behavior normal.        Thought Content: Thought content normal.        Judgment: Judgment normal.     Results: HIDA 10/2019  CLINICAL DATA:  Nausea and RIGHT upper quadrant abdominal pain for several weeks  EXAM: NUCLEAR MEDICINE HEPATOBILIARY IMAGING WITH GALLBLADDER EF  TECHNIQUE: Sequential images of the abdomen were obtained out to 60 minutes following intravenous administration of radiopharmaceutical. After oral ingestion of Ensure, gallbladder ejection fraction was determined. At 60 min, normal ejection fraction is greater than 33%.  RADIOPHARMACEUTICALS:  5.1 mCi Tc-70m  Choletec IV  COMPARISON:  11/09/2010  FINDINGS: Normal tracer extraction from bloodstream indicating normal hepatocellular function.  Normal excretion of tracer into biliary tree.  Gallbladder visualized at 10 min.  Small bowel visualized at 40 min.  No hepatic retention of  tracer.  Subjectively normal emptying of tracer from gallbladder following fatty meal stimulation.  Calculated gallbladder ejection fraction is 65%, normal.  Patient reported abdominal pain and nausea following Ensure ingestion.  Normal gallbladder ejection fraction following Ensure ingestion is greater than 33% at 1 hour.  IMPRESSION: Patent biliary tree with normal gallbladder ejection fraction of 65% following fatty meal stimulation.  Patient reported abdominal pain and nausea following Ensure ingestion.   Electronically Signed   By: Lavonia Dana M.D.   On: 10/10/2019 16:35  [place] - 09/2019 CLINICAL DATA:  Right upper quadrant and left upper quadrant pain.  EXAM: ABDOMEN ULTRASOUND COMPLETE  COMPARISON:  MRI abdomen 11/08/2010  FINDINGS: Gallbladder: No gallstones or wall thickening visualized. No sonographic Murphy sign noted by sonographer.  Common bile duct: Diameter: 3.5 mm  Liver: Increased parenchymal echogenicity. No focal abnormality. Portal vein is patent on color Doppler imaging with normal direction of blood flow towards the liver.  IVC: No abnormality visualized.  Pancreas: Visualized portion unremarkable.  Spleen: Size and appearance within normal limits.  Right Kidney: Length: 10.3 cm. Echogenicity within normal limits. No mass or hydronephrosis visualized.  Left Kidney: Length: 10.3 cm. Complex cystic lesion with internal echogenic focus within upper pole left kidney measures 2 x 1.4 x 2.0 cm. 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 Echogenicity within normal limits. No mass or hydronephrosis visualized.  Abdominal aorta: No aneurysm visualized.  Other findings: None.  IMPRESSION: 1. No acute abnormality. 2. Echogenic liver compatible with hepatic steatosis. 3. Again seen is a complex cystic structure  with internal echogenic foci localizing to upper pole of left kidney. 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   Electronically Signed  By: Kerby Moors M.D.   On: 10/01/2019 15:11  Assessment & Plan:  CHERIA WHOBREY is a 59 y.o. female with some degree of chronic cholecystitis versus biliary dyskinesia with pain on ingestion of the Ensure. Her gallbladder EF is trending down from 2012. Given these findings, and the constant pain it is reasonable to proceed with cholecystectomy. I warned that not all of her symptoms would improve and some could be from her IBS.   -PLAN: I counseled the patient about the indication, risks and benefits of laparoscopic cholecystectomy.  She understands there is a very small chance for bleeding, infection, injury to normal structures (including common bile duct), conversion to open surgery, persistent symptoms, evolution of postcholecystectomy diarrhea, need for secondary interventions, anesthesia reaction, cardiopulmonary issues and other risks not specifically detailed here. I described the expected recovery, the plan for follow-up and the restrictions during the recovery phase.  All questions were answered.   -Discussed COVID testing preop -Discussed that she has pain medication currently and I will only be able to prescribe a limited quantity if she has insufficient pills at the time of surgery. She is understanding.   All questions were answered to the satisfaction of the patient.    Virl Cagey 10/29/2019, 10:45 AM

## 2019-10-31 ENCOUNTER — Ambulatory Visit: Payer: Medicare Other | Admitting: Gastroenterology

## 2019-11-04 ENCOUNTER — Encounter (HOSPITAL_COMMUNITY): Payer: Self-pay

## 2019-11-04 ENCOUNTER — Encounter (HOSPITAL_COMMUNITY)
Admission: RE | Admit: 2019-11-04 | Discharge: 2019-11-04 | Disposition: A | Discharge status: Home / Self Care | Payer: Medicare Other | Source: Ambulatory Visit | Attending: General Surgery | Admitting: General Surgery

## 2019-11-04 ENCOUNTER — Other Ambulatory Visit: Payer: Self-pay

## 2019-11-05 ENCOUNTER — Other Ambulatory Visit (HOSPITAL_COMMUNITY)
Admission: RE | Admit: 2019-11-05 | Discharge: 2019-11-05 | Disposition: A | Discharge status: Home / Self Care | Payer: Medicare Other | Source: Ambulatory Visit | Attending: General Surgery | Admitting: General Surgery

## 2019-11-05 ENCOUNTER — Other Ambulatory Visit: Payer: Self-pay

## 2019-11-05 DIAGNOSIS — Z20822 Contact with and (suspected) exposure to covid-19: Secondary | ICD-10-CM | POA: Diagnosis not present

## 2019-11-05 DIAGNOSIS — Z01812 Encounter for preprocedural laboratory examination: Secondary | ICD-10-CM | POA: Insufficient documentation

## 2019-11-06 LAB — SARS CORONAVIRUS 2 (TAT 6-24 HRS): SARS Coronavirus 2: NEGATIVE

## 2019-11-07 ENCOUNTER — Ambulatory Visit (HOSPITAL_COMMUNITY): Payer: Medicare Other | Admitting: Anesthesiology

## 2019-11-07 ENCOUNTER — Other Ambulatory Visit: Payer: Medicare Other | Admitting: Adult Health

## 2019-11-07 ENCOUNTER — Other Ambulatory Visit: Payer: Self-pay

## 2019-11-07 ENCOUNTER — Encounter (HOSPITAL_COMMUNITY): Payer: Self-pay | Admitting: General Surgery

## 2019-11-07 ENCOUNTER — Ambulatory Visit (HOSPITAL_COMMUNITY): Payer: Medicare Other

## 2019-11-07 ENCOUNTER — Encounter (HOSPITAL_COMMUNITY)
Admission: RE | Disposition: A | Discharge status: Home / Self Care | Payer: Self-pay | Source: Home / Self Care | Attending: General Surgery

## 2019-11-07 ENCOUNTER — Ambulatory Visit (HOSPITAL_COMMUNITY)
Admission: RE | Admit: 2019-11-07 | Discharge: 2019-11-07 | Disposition: A | Discharge status: Home / Self Care | Payer: Medicare Other | Attending: General Surgery | Admitting: General Surgery

## 2019-11-07 DIAGNOSIS — Z885 Allergy status to narcotic agent status: Secondary | ICD-10-CM | POA: Diagnosis not present

## 2019-11-07 DIAGNOSIS — K811 Chronic cholecystitis: Secondary | ICD-10-CM | POA: Diagnosis present

## 2019-11-07 DIAGNOSIS — Z881 Allergy status to other antibiotic agents status: Secondary | ICD-10-CM | POA: Insufficient documentation

## 2019-11-07 DIAGNOSIS — K219 Gastro-esophageal reflux disease without esophagitis: Secondary | ICD-10-CM | POA: Diagnosis not present

## 2019-11-07 DIAGNOSIS — M5020 Other cervical disc displacement, unspecified cervical region: Secondary | ICD-10-CM | POA: Insufficient documentation

## 2019-11-07 DIAGNOSIS — F17290 Nicotine dependence, other tobacco product, uncomplicated: Secondary | ICD-10-CM | POA: Diagnosis not present

## 2019-11-07 DIAGNOSIS — M1990 Unspecified osteoarthritis, unspecified site: Secondary | ICD-10-CM | POA: Diagnosis not present

## 2019-11-07 DIAGNOSIS — Z882 Allergy status to sulfonamides status: Secondary | ICD-10-CM | POA: Diagnosis not present

## 2019-11-07 DIAGNOSIS — Z79899 Other long term (current) drug therapy: Secondary | ICD-10-CM | POA: Diagnosis not present

## 2019-11-07 DIAGNOSIS — Z8711 Personal history of peptic ulcer disease: Secondary | ICD-10-CM | POA: Insufficient documentation

## 2019-11-07 DIAGNOSIS — K828 Other specified diseases of gallbladder: Secondary | ICD-10-CM | POA: Diagnosis not present

## 2019-11-07 DIAGNOSIS — Z8542 Personal history of malignant neoplasm of other parts of uterus: Secondary | ICD-10-CM | POA: Diagnosis not present

## 2019-11-07 DIAGNOSIS — M797 Fibromyalgia: Secondary | ICD-10-CM | POA: Diagnosis not present

## 2019-11-07 DIAGNOSIS — G8929 Other chronic pain: Secondary | ICD-10-CM | POA: Insufficient documentation

## 2019-11-07 DIAGNOSIS — Z888 Allergy status to other drugs, medicaments and biological substances status: Secondary | ICD-10-CM | POA: Insufficient documentation

## 2019-11-07 HISTORY — PX: CHOLECYSTECTOMY: SHX55

## 2019-11-07 SURGERY — LAPAROSCOPIC CHOLECYSTECTOMY
Anesthesia: General

## 2019-11-07 MED ORDER — SODIUM CHLORIDE 0.9 % IV SOLN
2.0000 g | INTRAVENOUS | Status: AC
  Administered 2019-11-07: 10:00:00 2 g via INTRAVENOUS
  Filled 2019-11-07: qty 2

## 2019-11-07 MED ORDER — SODIUM CHLORIDE 0.9 % IR SOLN
Status: DC | PRN
  Administered 2019-11-07: 1000 mL

## 2019-11-07 MED ORDER — DOCUSATE SODIUM 100 MG PO CAPS
100.0000 mg | ORAL_CAPSULE | Freq: Two times a day (BID) | ORAL | 2 refills | Status: DC
Start: 2019-11-07 — End: 2020-01-08

## 2019-11-07 MED ORDER — DEXAMETHASONE SODIUM PHOSPHATE 10 MG/ML IJ SOLN
INTRAMUSCULAR | Status: AC
  Filled 2019-11-07: qty 1

## 2019-11-07 MED ORDER — FENTANYL CITRATE (PF) 250 MCG/5ML IJ SOLN
INTRAMUSCULAR | Status: AC
  Filled 2019-11-07: qty 5

## 2019-11-07 MED ORDER — SUGAMMADEX SODIUM 500 MG/5ML IV SOLN
INTRAVENOUS | Status: DC | PRN
  Administered 2019-11-07: 180 mg via INTRAVENOUS

## 2019-11-07 MED ORDER — FENTANYL CITRATE (PF) 100 MCG/2ML IJ SOLN
INTRAMUSCULAR | Status: DC | PRN
  Administered 2019-11-07 (×5): 50 ug via INTRAVENOUS

## 2019-11-07 MED ORDER — SUCCINYLCHOLINE CHLORIDE 20 MG/ML IJ SOLN
INTRAMUSCULAR | Status: DC | PRN
  Administered 2019-11-07: 40 mg via INTRAVENOUS

## 2019-11-07 MED ORDER — HYDROMORPHONE HCL 1 MG/ML IJ SOLN
0.2500 mg | INTRAMUSCULAR | Status: DC | PRN
  Administered 2019-11-07 (×4): 0.5 mg via INTRAVENOUS
  Filled 2019-11-07 (×4): qty 0.5

## 2019-11-07 MED ORDER — LACTATED RINGERS IV SOLN
INTRAVENOUS | Status: DC
  Administered 2019-11-07: 1000 mL via INTRAVENOUS

## 2019-11-07 MED ORDER — BUPIVACAINE HCL (PF) 0.5 % IJ SOLN
INTRAMUSCULAR | Status: AC
  Filled 2019-11-07: qty 30

## 2019-11-07 MED ORDER — SCOPOLAMINE 1 MG/3DAYS TD PT72
1.0000 | MEDICATED_PATCH | TRANSDERMAL | Status: DC
  Administered 2019-11-07: 1.5 mg via TRANSDERMAL

## 2019-11-07 MED ORDER — BUPIVACAINE HCL (PF) 0.5 % IJ SOLN
INTRAMUSCULAR | Status: DC | PRN
  Administered 2019-11-07: 10 mL

## 2019-11-07 MED ORDER — DEXAMETHASONE SODIUM PHOSPHATE 10 MG/ML IJ SOLN
INTRAMUSCULAR | Status: DC | PRN
  Administered 2019-11-07: 10 mg via INTRAVENOUS

## 2019-11-07 MED ORDER — PROPOFOL 10 MG/ML IV BOLUS
INTRAVENOUS | Status: DC | PRN
  Administered 2019-11-07: 20 mg via INTRAVENOUS
  Administered 2019-11-07: 180 mg via INTRAVENOUS

## 2019-11-07 MED ORDER — CHLORHEXIDINE GLUCONATE CLOTH 2 % EX PADS: 6.0000 | MEDICATED_PAD | Freq: Once | CUTANEOUS | Status: DC

## 2019-11-07 MED ORDER — ONDANSETRON HCL 4 MG/2ML IJ SOLN: 4.0000 mg | Freq: Once | INTRAMUSCULAR | Status: DC | PRN

## 2019-11-07 MED ORDER — ROCURONIUM BROMIDE 100 MG/10ML IV SOLN
INTRAVENOUS | Status: DC | PRN
  Administered 2019-11-07: 30 mg via INTRAVENOUS

## 2019-11-07 MED ORDER — SCOPOLAMINE 1 MG/3DAYS TD PT72
MEDICATED_PATCH | TRANSDERMAL | Status: AC
  Filled 2019-11-07: qty 1

## 2019-11-07 MED ORDER — OXYCODONE HCL 5 MG PO TABS: 5.0000 mg | ORAL_TABLET | ORAL | 0 refills | Status: DC | PRN

## 2019-11-07 MED ORDER — HEMOSTATIC AGENTS (NO CHARGE) OPTIME
TOPICAL | Status: DC | PRN
  Administered 2019-11-07: 1 via TOPICAL

## 2019-11-07 MED ORDER — LIDOCAINE HCL (CARDIAC) PF 50 MG/5ML IV SOSY
PREFILLED_SYRINGE | INTRAVENOUS | Status: DC | PRN
  Administered 2019-11-07: 30 mg via INTRAVENOUS

## 2019-11-07 SURGICAL SUPPLY — 45 items
APPLIER CLIP ROT 10 11.4 M/L (STAPLE) ×3
BAG RETRIEVAL 10 (BASKET) ×1
BAG RETRIEVAL 10MM (BASKET) ×1
BLADE SURG 15 STRL LF DISP TIS (BLADE) ×1 IMPLANT
BLADE SURG 15 STRL SS (BLADE) ×2
CHLORAPREP W/TINT 26 (MISCELLANEOUS) ×3 IMPLANT
CLIP APPLIE ROT 10 11.4 M/L (STAPLE) ×1 IMPLANT
CLOTH BEACON ORANGE TIMEOUT ST (SAFETY) ×3 IMPLANT
COVER LIGHT HANDLE STERIS (MISCELLANEOUS) ×6 IMPLANT
COVER WAND RF STERILE (DRAPES) ×3 IMPLANT
DECANTER SPIKE VIAL GLASS SM (MISCELLANEOUS) ×3 IMPLANT
DERMABOND ADVANCED (GAUZE/BANDAGES/DRESSINGS) ×2
DERMABOND ADVANCED .7 DNX12 (GAUZE/BANDAGES/DRESSINGS) ×1 IMPLANT
ELECT REM PT RETURN 9FT ADLT (ELECTROSURGICAL) ×3
ELECTRODE REM PT RTRN 9FT ADLT (ELECTROSURGICAL) ×1 IMPLANT
GLOVE BIO SURGEON STRL SZ 6.5 (GLOVE) ×3 IMPLANT
GLOVE BIO SURGEONS STRL SZ 6.5 (GLOVE) ×2
GLOVE BIOGEL PI IND STRL 6.5 (GLOVE) ×1 IMPLANT
GLOVE BIOGEL PI IND STRL 7.0 (GLOVE) ×3 IMPLANT
GLOVE BIOGEL PI INDICATOR 6.5 (GLOVE) ×4
GLOVE BIOGEL PI INDICATOR 7.0 (GLOVE) ×6
GLOVE SURG SS PI 7.5 STRL IVOR (GLOVE) ×2 IMPLANT
GOWN STRL REUS W/TWL LRG LVL3 (GOWN DISPOSABLE) ×11 IMPLANT
HEMOSTAT SNOW SURGICEL 2X4 (HEMOSTASIS) ×3 IMPLANT
INST SET LAPROSCOPIC AP (KITS) ×3 IMPLANT
KIT TURNOVER KIT A (KITS) ×3 IMPLANT
MANIFOLD NEPTUNE II (INSTRUMENTS) ×3 IMPLANT
NDL INSUFFLATION 14GA 120MM (NEEDLE) ×1 IMPLANT
NEEDLE INSUFFLATION 14GA 120MM (NEEDLE) ×3 IMPLANT
NS IRRIG 1000ML POUR BTL (IV SOLUTION) ×3 IMPLANT
PACK LAP CHOLE LZT030E (CUSTOM PROCEDURE TRAY) ×3 IMPLANT
PAD ARMBOARD 7.5X6 YLW CONV (MISCELLANEOUS) ×3 IMPLANT
SET BASIN LINEN APH (SET/KITS/TRAYS/PACK) ×3 IMPLANT
SET TUBE SMOKE EVAC HIGH FLOW (TUBING) ×3 IMPLANT
SLEEVE ENDOPATH XCEL 5M (ENDOMECHANICALS) ×3 IMPLANT
SUT MNCRL AB 4-0 PS2 18 (SUTURE) ×3 IMPLANT
SUT VICRYL 0 UR6 27IN ABS (SUTURE) ×3 IMPLANT
SYS BAG RETRIEVAL 10MM (BASKET) ×1
SYSTEM BAG RETRIEVAL 10MM (BASKET) ×1 IMPLANT
TROCAR ENDO BLADELESS 11MM (ENDOMECHANICALS) ×3 IMPLANT
TROCAR XCEL NON-BLD 5MMX100MML (ENDOMECHANICALS) ×3 IMPLANT
TROCAR XCEL UNIV SLVE 11M 100M (ENDOMECHANICALS) ×3 IMPLANT
TUBE CONNECTING 12'X1/4 (SUCTIONS) ×1
TUBE CONNECTING 12X1/4 (SUCTIONS) ×2 IMPLANT
WARMER LAPAROSCOPE (MISCELLANEOUS) ×3 IMPLANT

## 2019-11-07 NOTE — Transfer of Care (Signed)
Immediate Anesthesia Transfer of Care Note  Patient: Susan Baker  Procedure(s) Performed: LAPAROSCOPIC CHOLECYSTECTOMY (N/A )  Patient Location: PACU  Anesthesia Type:General  Level of Consciousness: awake  Airway & Oxygen Therapy: Patient Spontanous Breathing  Post-op Assessment: Report given to RN  Post vital signs: Reviewed  Last Vitals:  Vitals Value Taken Time  BP    Temp    Pulse 87 11/07/19 1112  Resp 18 11/07/19 1112  SpO2 94 % 11/07/19 1112  Vitals shown include unvalidated device data.  Last Pain:  Vitals:   11/07/19 0927  TempSrc: Oral  PainSc: 6       Patients Stated Pain Goal: 5 ([id number])  Complications: No apparent anesthesia complications

## 2019-11-07 NOTE — Anesthesia Procedure Notes (Signed)
Procedure Name: Intubation Date/Time: 11/07/2019 10:15 AM Performed by: Vista Deck, CRNA Pre-anesthesia Checklist: Patient identified, Emergency Drugs available, Suction available and Patient being monitored Patient Re-evaluated:Patient Re-evaluated prior to induction Oxygen Delivery Method: Circle system utilized Preoxygenation: Pre-oxygenation with 100% oxygen Induction Type: IV induction Ventilation: Mask ventilation without difficulty Laryngoscope Size: Mac and 3 Grade View: Grade I Laser Tube: Cuffed inflated with minimal occlusive pressure - saline Tube size: 7.0 mm Number of attempts: 1 Placement Confirmation: ETT inserted through vocal cords under direct vision,  positive ETCO2 and breath sounds checked- equal and bilateral Secured at: 23 cm Tube secured with: Tape Dental Injury: Teeth and Oropharynx as per pre-operative assessment

## 2019-11-07 NOTE — Op Note (Signed)
Operative Note   Preoperative Diagnosis: Biliary dyskinesia    Postoperative Diagnosis: Same   Procedure(s) Performed: Laparoscopic cholecystectomy   Surgeon: Ria Comment C. Constance Haw, MD   Assistants:  Aviva Signs, MD    Anesthesia: General endotracheal   Anesthesiologist: Louann Sjogren, MD    Specimens: Gallbladder    Estimated Blood Loss: Minimal    Blood Replacement: None    Complications: None    Operative Findings: Normal appearing gallbladder with bile    Procedure: The patient was taken to the operating room and placed supine. General endotracheal anesthesia was induced. Intravenous antibiotics were administered per protocol. An orogastric tube positioned to decompress the stomach. The abdomen was prepared and draped in the usual sterile fashion.    A supraumbilical incision was made and a Veress technique was utilized to achieve pneumoperitoneum to 15 mmHg with carbon dioxide. A 11 mm optiview port was placed through the supraumbilical region, and a 10 mm 0-degree operative laparoscope was introduced. The area underlying the trocar and Veress needle were inspected and without evidence of injury.  Remaining trocars were placed under direct vision. Two 5 mm ports were placed in the right abdomen, between the anterior axillary and midclavicular line.  A final 11 mm port was placed through the mid-epigastrium, near the falciform ligament.   Minor omental attachments were taken down. The gallbladder fundus was elevated cephalad and the infundibulum was retracted to the patient's right. The gallbladder/cystic duct junction was skeletonized. The cystic artery was very small and noted in the triangle of Calot and was also skeletonized.  We then continued liberal medial and lateral dissection until the critical view of safety was achieved.    The cystic duct was doubly clipped and divided. The cystic artery was very small and was cauterized.  The gallbladder was then dissected from the  liver bed with electrocautery. A small posterior vessel in the mesentery was clipped. The specimen was placed in an Endopouch and was retrieved through the epigastric site. There was some minor spillage of bile that was suctioned.    Final inspection revealed acceptable hemostasis. Surgical SNOW was placed in the gallbladder bed.  Trocars were removed and pneumoperitoneum was released.  0 Vicryl fascial sutures were used to close umbilical port site and the epigastric site was overlying the rib. Skin incisions were closed with 4-0 Monocryl subcuticular sutures and Dermabond. The patient was awakened from anesthesia and extubated without complication.    Curlene Labrum, MD [facility] [address] [place], [place] [address] [phone] (office)

## 2019-11-07 NOTE — Anesthesia Preprocedure Evaluation (Signed)
Anesthesia Evaluation  Patient identified by MRN, date of birth, ID band Patient awake    Reviewed: Allergy & Precautions, H&P , NPO status , Patient's Chart, lab work & pertinent test results, reviewed documented beta blocker date and time   Airway Mallampati: II  TM Distance: >3 FB Neck ROM: full    Dental no notable dental hx.    Pulmonary neg pulmonary ROS, Current Smoker,    Pulmonary exam normal breath sounds clear to auscultation       Cardiovascular Exercise Tolerance: Good negative cardio ROS   Rhythm:regular Rate:Normal     Neuro/Psych PSYCHIATRIC DISORDERS Anxiety Depression  Neuromuscular disease    GI/Hepatic Neg liver ROS, hiatal hernia, PUD, GERD  Controlled,  Endo/Other  negative endocrine ROS  Renal/GU negative Renal ROS  negative genitourinary   Musculoskeletal   Abdominal   Peds  Hematology negative hematology ROS (+)   Anesthesia Other Findings   Reproductive/Obstetrics negative OB ROS                             Anesthesia Physical Anesthesia Plan  ASA: III  Anesthesia Plan: General   Post-op Pain Management:    Induction:   PONV Risk Score and Plan: 3 and Ondansetron  Airway Management Planned:   Additional Equipment:   Intra-op Plan:   Post-operative Plan:   Informed Consent: I have reviewed the patients History and Physical, chart, labs and discussed the procedure including the risks, benefits and alternatives for the proposed anesthesia with the patient or authorized representative who has indicated his/her understanding and acceptance.     Dental Advisory Given  Plan Discussed with: CRNA  Anesthesia Plan Comments:         Anesthesia Quick Evaluation

## 2019-11-07 NOTE — Anesthesia Postprocedure Evaluation (Signed)
Anesthesia Post Note  Patient: Susan Baker  Procedure(s) Performed: LAPAROSCOPIC CHOLECYSTECTOMY (N/A )  Patient location during evaluation: PACU Anesthesia Type: General Level of consciousness: awake and alert Pain management: pain level controlled Vital Signs Assessment: post-procedure vital signs reviewed and stable Respiratory status: spontaneous breathing, nonlabored ventilation, respiratory function stable and patient connected to nasal cannula oxygen Cardiovascular status: blood pressure returned to baseline and stable Postop Assessment: no apparent nausea or vomiting Anesthetic complications: no     Last Vitals:  Vitals:   11/07/19 1115 11/07/19 1130  BP: 137/71 126/66  Pulse: 82 66  Resp: 17 20  Temp: 36.7 C   SpO2: 95% 93%    Last Pain:  Vitals:   11/07/19 1130  TempSrc:   PainSc: Asleep                 Khamya Topp

## 2019-11-07 NOTE — Progress Notes (Signed)
[facility]  Spoke to son, notified surgery completed. Rx sent to [facility] in [place].  Curlene Labrum, MD [facility] [address] [place], [place] [address] [phone] (office)

## 2019-11-07 NOTE — Discharge Instructions (Signed)
Discharge Laparoscopic Surgery Instructions:  Common Complaints: Right shoulder pain is common after laparoscopic surgery. This is secondary to the gas used in the surgery being trapped under the diaphragm.  Walk to help your body absorb the gas. This will improve in a few days. Pain at the port sites are common, especially the larger port sites. This will improve with time.  Some nausea is common and poor appetite. The main goal is to stay hydrated the first few days after surgery.   Diet/ Activity: Diet as tolerated. You may not have an appetite, but it is important to stay hydrated. Drink 64 ounces of water a day. Your appetite will return with time.  Shower per your regular routine daily.  Do not take hot showers. Take warm showers that are less than 10 minutes. Rest and listen to your body, but do not remain in bed all day.  Walk everyday for at least 15-20 minutes. Deep cough and move around every 1-2 hours in the first few days after surgery.  Do not lift > 10 lbs, perform excessive bending, pushing, pulling, squatting for 1-2 weeks after surgery.  Do not pick at the dermabond glue on your incision sites.  This glue film will remain in place for 1-2 weeks and will start to peel off.  Do not place lotions or balms on your incision unless instructed to specifically by Dr. Constance Haw.   Pain Expectations and Narcotics: -After surgery you will have pain associated with your incisions and this is normal. The pain is muscular and nerve pain, and will get better with time. -You are encouraged and expected to take non narcotic medications like tylenol and ibuprofen (when able) to treat pain as multiple modalities can aid with pain treatment. -Narcotics are only used when pain is severe or there is breakthrough pain. -You are not expected to have a pain score of 0 after surgery, as we cannot prevent pain. A pain score of 3-4 that allows you to be functional, move, walk, and tolerate some activity is  the goal. The pain will continue to improve over the days after surgery and is dependent on your surgery. -Due to [place] law, we are only able to give a certain amount of pain medication to treat post operative pain, and we only give additional narcotics on a patient by patient basis.  -For most laparoscopic surgery, studies have shown that the majority of patients only need 10-15 narcotic pills, and for open surgeries most patients only need 15-20.   -Having appropriate expectations of pain and knowledge of pain management with non narcotics is important as we do not want anyone to become addicted to narcotic pain medication.  -Using ice packs in the first 48 hours and heating pads after 48 hours, wearing an abdominal binder (when recommended), and using over the counter medications are all ways to help with pain management.   -Simple acts like meditation and mindfulness practices after surgery can also help with pain control and research has proven the benefit of these practices.  Medication: Take tylenol and ibuprofen as needed for pain control, alternating every 4-6 hours.  Example:  Tylenol 1000mg  @ 6am, 12noon, 6pm, 90midnight (Do not exceed 4000mg  of tylenol a day). Ibuprofen 800mg  @ 9am, 3pm, 9pm, 3am (Do not exceed 3600mg  of ibuprofen a day).  Take Roxicodone for breakthrough pain every 4 hours.  DO NOT TAKE YOUR CHRONIC MEDICINE AT THE SAME TIME AS THIS MEDICATION. Take Colace for constipation related to narcotic pain medication.  If you do not have a bowel movement in 2 days, take Miralax over the counter.  Drink plenty of water to also prevent constipation.   Contact Information: If you have questions or concerns, please call our office, [phone], Monday- Thursday 8AM-5PM and Friday 8AM-12Noon.  If it is after hours or on the weekend, please call [facility] Main Number, [phone], and ask to speak to the surgeon on call for Dr. Constance Haw at [facility].    Laparoscopic Cholecystectomy,  Care After This sheet gives you information about how to care for yourself after your procedure. Your health care provider may also give you more specific instructions. If you have problems or questions, contact your health care provider. What can I expect after the procedure? After the procedure, it is common to have:  Pain at your incision sites. You will be given medicines to control this pain.  Mild nausea or vomiting.  Bloating and possible shoulder pain from the air-like gas that was used during the procedure. Follow these instructions at home: Incision care   Follow instructions from your health care provider about how to take care of your incisions. Make sure you: ? Wash your hands with soap and water before you change your bandage (dressing). If soap and water are not available, use hand sanitizer. ? Change your dressing as told by your health care provider. ? Leave stitches (sutures), skin glue, or adhesive strips in place. These skin closures may need to be in place for 2 weeks or longer. If adhesive strip edges start to loosen and curl up, you may trim the loose edges. Do not remove adhesive strips completely unless your health care provider tells you to do that.  Do not take baths, swim, or use a hot tub until your health care provider approves.  You may shower.  Check your incision area every day for signs of infection. Check for: ? More redness, swelling, or pain. ? More fluid or blood. ? Warmth. ? Pus or a bad smell. Activity  Do not drive or use heavy machinery while taking prescription pain medicine.  Do not lift anything that is heavier than 10 lb (4.5 kg) until your health care provider approves.  Do not play contact sports until your health care provider approves.  Do not drive for 24 hours if you were given a medicine to help you relax (sedative).  Rest as needed. Do not return to work or school until your health care provider approves. General  instructions  Take over-the-counter and prescription medicines only as told by your health care provider.  To prevent or treat constipation while you are taking prescription pain medicine, your health care provider may recommend that you: ? Drink enough fluid to keep your urine clear or pale yellow. ? Take over-the-counter or prescription medicines. ? Eat foods that are high in fiber, such as fresh fruits and vegetables, whole grains, and beans. ? Limit foods that are high in fat and processed sugars, such as fried and sweet foods. Contact a health care provider if:  You develop a rash.  You have more redness, swelling, or pain around your incisions.  You have more fluid or blood coming from your incisions.  Your incisions feel warm to the touch.  You have pus or a bad smell coming from your incisions.  You have a fever.  One or more of your incisions breaks open. Get help right away if:  You have trouble breathing.  You have chest pain.  You have  increasing pain in your shoulders.  You faint or feel dizzy when you stand.  You have severe pain in your abdomen.  You have nausea or vomiting that lasts for more than one day.  You have leg pain. This information is not intended to replace advice given to you by your health care provider. Make sure you discuss any questions you have with your health care provider. Document Revised: 06/01/2017 Document Reviewed: 12/06/2015 Elsevier Patient Education  2020 [place] Anesthesia, Adult, Care After This sheet gives you information about how to care for yourself after your procedure. Your health care provider may also give you more specific instructions. If you have problems or questions, contact your health care provider. What can I expect after the procedure? After the procedure, the following side effects are common:  Pain or discomfort at the IV site.  Nausea.  Vomiting.  Sore throat.  Trouble  concentrating.  Feeling cold or chills.  Weak or tired.  Sleepiness and fatigue.  Soreness and body aches. These side effects can affect parts of the body that were not involved in surgery. Follow these instructions at home:  For at least 24 hours after the procedure:  Have a responsible adult stay with you. It is important to have someone help care for you until you are awake and alert.  Rest as needed.  Do not: ? Participate in activities in which you could fall or become injured. ? Drive. ? Use heavy machinery. ? Drink alcohol. ? Take sleeping pills or medicines that cause drowsiness. ? Make important decisions or sign legal documents. ? Take care of children on your own. Eating and drinking  Follow any instructions from your health care provider about eating or drinking restrictions.  When you feel hungry, start by eating small amounts of foods that are soft and easy to digest (bland), such as toast. Gradually return to your regular diet.  Drink enough fluid to keep your urine pale yellow.  If you vomit, rehydrate by drinking water, juice, or clear broth. General instructions  If you have sleep apnea, surgery and certain medicines can increase your risk for breathing problems. Follow instructions from your health care provider about wearing your sleep device: ? Anytime you are sleeping, including during daytime naps. ? While taking prescription pain medicines, sleeping medicines, or medicines that make you drowsy.  Return to your normal activities as told by your health care provider. Ask your health care provider what activities are safe for you.  Take over-the-counter and prescription medicines only as told by your health care provider.  If you smoke, do not smoke without supervision.  Keep all follow-up visits as told by your health care provider. This is important. Contact a health care provider if:  You have nausea or vomiting that does not get better with  medicine.  You cannot eat or drink without vomiting.  You have pain that does not get better with medicine.  You are unable to pass urine.  You develop a skin rash.  You have a fever.  You have redness around your IV site that gets worse. Get help right away if:  You have difficulty breathing.  You have chest pain.  You have blood in your urine or stool, or you vomit blood. Summary  After the procedure, it is common to have a sore throat or nausea. It is also common to feel tired.  Have a responsible adult stay with you for the first 24 hours after general  anesthesia. It is important to have someone help care for you until you are awake and alert.  When you feel hungry, start by eating small amounts of foods that are soft and easy to digest (bland), such as toast. Gradually return to your regular diet.  Drink enough fluid to keep your urine pale yellow.  Return to your normal activities as told by your health care provider. Ask your health care provider what activities are safe for you. This information is not intended to replace advice given to you by your health care provider. Make sure you discuss any questions you have with your health care provider. Document Revised: 06/22/2017 Document Reviewed: 02/02/2017 Elsevier Patient Education  [place].

## 2019-11-07 NOTE — Interval H&P Note (Signed)
History and Physical Interval Note:  11/07/2019 9:25 AM  Susan Baker  has presented today for surgery, with the diagnosis of Biliary dyskensia; Chronic Cholecystitis.  The various methods of treatment have been discussed with the patient and family. After consideration of risks, benefits and other options for treatment, the patient has consented to  Procedure(s): LAPAROSCOPIC CHOLECYSTECTOMY (N/A) as a surgical intervention.  The patient's history has been reviewed, patient examined, no change in status, stable for surgery.  I have reviewed the patient's chart and labs.  Questions were answered to the patient's satisfaction.     Virl Cagey

## 2019-11-10 LAB — SURGICAL PATHOLOGY

## 2019-11-12 ENCOUNTER — Ambulatory Visit: Payer: Medicare Other | Admitting: Neurology

## 2019-11-18 ENCOUNTER — Other Ambulatory Visit: Payer: Self-pay | Admitting: Gastroenterology

## 2019-11-19 ENCOUNTER — Ambulatory Visit: Payer: Medicare Other | Admitting: Rheumatology

## 2019-11-20 ENCOUNTER — Telehealth: Payer: Self-pay | Admitting: Internal Medicine

## 2019-11-20 DIAGNOSIS — K2100 Gastro-esophageal reflux disease with esophagitis, without bleeding: Secondary | ICD-10-CM

## 2019-11-20 DIAGNOSIS — K257 Chronic gastric ulcer without hemorrhage or perforation: Secondary | ICD-10-CM

## 2019-11-20 NOTE — Telephone Encounter (Signed)
Medication Protonix 40 mg #30, one capsule once daily refill request received from [place], [place]

## 2019-11-20 NOTE — Telephone Encounter (Signed)
PATIENT CALLED AND SAID THAT HER GENERIC PROTONIX NEEDED A PRIOR AUTH     [facility] SUPPOSED TO BE FAXING IT TO [place]

## 2019-11-20 NOTE — Telephone Encounter (Signed)
PA info hasn't arrived. Will continue to look out to complete PA.

## 2019-11-21 MED ORDER — PANTOPRAZOLE SODIUM 40 MG PO TBEC: DELAYED_RELEASE_TABLET | ORAL | 5 refills | Status: DC

## 2019-11-21 NOTE — Addendum Note (Signed)
Addended by: Gordy Levan, Eri Mcevers A on: 11/21/2019 11:36 AM   Modules accepted: Orders

## 2019-11-21 NOTE — Telephone Encounter (Signed)
Rx sent 

## 2019-11-24 NOTE — Telephone Encounter (Signed)
Noted  

## 2019-11-25 ENCOUNTER — Telehealth (INDEPENDENT_AMBULATORY_CARE_PROVIDER_SITE_OTHER): Payer: Self-pay | Admitting: General Surgery

## 2019-11-25 DIAGNOSIS — K828 Other specified diseases of gallbladder: Secondary | ICD-10-CM

## 2019-11-25 NOTE — Progress Notes (Signed)
[facility]  I am calling the patient for post operative evaluation due to the current COVID 19 pandemic.  The patient had a laparoscopic cholecystectomy on 11/07/2019. The patient reports that they are doing well but has been bloated some and this is improving over the last few days. The are tolerating a diet, having good pain control, and having some constipation. She has not had any diarrhea. She is taking stool softener. I told her to try benefiber or metamucil. The patient has no concerns. Her incisions are healing well.  She has chronic pain and is trying to get in with another pain doctor, I did prescribe her a few for surgery.  Will see the patient PRN.   Activity and diet as tolerated.   Pathology: FINAL MICROSCOPIC DIAGNOSIS:   A. GALLBLADDER, CHOLECYSTECTOMY:  - Chronic cholecystitis.  - There is no evidence of malignancy.   Curlene Labrum, MD [facility] [address] [place], [place] [address] [phone] (office)

## 2019-12-22 ENCOUNTER — Encounter: Payer: Self-pay | Admitting: Adult Health

## 2019-12-22 ENCOUNTER — Other Ambulatory Visit: Payer: Self-pay | Admitting: Nurse Practitioner

## 2019-12-22 ENCOUNTER — Other Ambulatory Visit (HOSPITAL_COMMUNITY)
Admission: RE | Admit: 2019-12-22 | Discharge: 2019-12-22 | Disposition: A | Discharge status: Home / Self Care | Payer: Medicare Other | Source: Ambulatory Visit | Attending: Obstetrics and Gynecology | Admitting: Obstetrics and Gynecology

## 2019-12-22 ENCOUNTER — Ambulatory Visit (INDEPENDENT_AMBULATORY_CARE_PROVIDER_SITE_OTHER): Payer: Medicare Other | Admitting: Adult Health

## 2019-12-22 VITALS — BP 124/81 | HR 74 | Ht 62.5 in | Wt 149.5 lb

## 2019-12-22 DIAGNOSIS — Z8542 Personal history of malignant neoplasm of other parts of uterus: Secondary | ICD-10-CM | POA: Diagnosis not present

## 2019-12-22 DIAGNOSIS — Z08 Encounter for follow-up examination after completed treatment for malignant neoplasm: Secondary | ICD-10-CM | POA: Diagnosis present

## 2019-12-22 DIAGNOSIS — Z1211 Encounter for screening for malignant neoplasm of colon: Secondary | ICD-10-CM

## 2019-12-22 DIAGNOSIS — Z90710 Acquired absence of both cervix and uterus: Secondary | ICD-10-CM | POA: Insufficient documentation

## 2019-12-22 DIAGNOSIS — Z1212 Encounter for screening for malignant neoplasm of rectum: Secondary | ICD-10-CM | POA: Diagnosis not present

## 2019-12-22 DIAGNOSIS — Z01419 Encounter for gynecological examination (general) (routine) without abnormal findings: Secondary | ICD-10-CM | POA: Diagnosis not present

## 2019-12-22 DIAGNOSIS — K2100 Gastro-esophageal reflux disease with esophagitis, without bleeding: Secondary | ICD-10-CM

## 2019-12-22 DIAGNOSIS — N870 Mild cervical dysplasia: Secondary | ICD-10-CM | POA: Diagnosis not present

## 2019-12-22 DIAGNOSIS — R87622 Low grade squamous intraepithelial lesion on cytologic smear of vagina (LGSIL): Secondary | ICD-10-CM | POA: Diagnosis not present

## 2019-12-22 DIAGNOSIS — K257 Chronic gastric ulcer without hemorrhage or perforation: Secondary | ICD-10-CM

## 2019-12-22 DIAGNOSIS — Z1151 Encounter for screening for human papillomavirus (HPV): Secondary | ICD-10-CM | POA: Diagnosis not present

## 2019-12-22 DIAGNOSIS — R87629 Unspecified abnormal cytological findings in specimens from vagina: Secondary | ICD-10-CM

## 2019-12-22 LAB — HEMOCCULT GUIAC POC 1CARD (OFFICE): Fecal Occult Blood, POC: NEGATIVE

## 2019-12-22 NOTE — Progress Notes (Signed)
Patient ID: Susan Baker, female   DOB: [birth year], 59 y.o.   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 She is on disability now. She had gallbladder removed 11/07/19.  PCP is Dr Maudie Mercury.  Current Medications, Allergies, Past Medical History, Past Surgical History, Family History and Social History were reviewed in [employer] record.     Review of Systems: Patient denies any headaches, hearing loss, fatigue, blurred vision, shortness of breath, chest pain, abdominal pain, problems with bowel movements, urination, or intercourse(not active). No joint pain or mood swings. 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 Mom died 09/29/2019.  Physical Exam:BP 124/81 (BP Location: Left Arm, Patient Position: Sitting, Cuff Size: Normal)   Pulse 74   Ht 5' 2.5" (1.588 m)   Wt 149 lb 8 oz (67.8 kg)   BMI 26.91 kg/m  General:  Well developed, well nourished, no acute distress Skin:  Warm and dry,tan Neck:  Midline trachea, normal thyroid, good ROM, no lymphadenopathy Lungs; Clear to auscultation bilaterally Breast:  No dominant palpable mass, retraction, or nipple discharge Cardiovascular: Regular rate and rhythm Abdomen:  Soft, non tender, no hepatosplenomegaly Pelvic:  External genitalia is normal in appearance, no lesions.  The vagina is pale with loss of moisture and rugae, no lesions seen. Urethra has no lesions or masses. The cervix and uterus are absent.  No adnexal masses or tenderness noted.Bladder is non tender, no masses felt. Rectal: Good sphincter tone, no polyps, or hemorrhoids felt.  Hemoccult  negative. Extremities/musculoskeletal:  No swelling or varicosities noted, no clubbing or cyanosis Psych:  No mood changes, alert and cooperative,seems happy AA 0 Fall risk is low PHQ 9 score is 4, no SI, is on Prozac.  Examination chaperoned by Dwyane Dee LPN  Impression and Plan:   1. Vaginal Pap smear following hysterectomy for malignancy Pap sent Physical in 1 year Get mammogram  Labs with PCP -decrease smoking,no vaping   2. Screening for colorectal cancer Colonoscopy per GI  3. Abnormal Pap smear of vagina Pap sent

## 2019-12-25 ENCOUNTER — Telehealth: Payer: Self-pay | Admitting: Adult Health

## 2019-12-25 LAB — CYTOLOGY - PAP
Comment: NEGATIVE
High risk HPV: NEGATIVE

## 2019-12-25 NOTE — Telephone Encounter (Signed)
Pt aware that pap shows lo grad lesion again, get colp with Dr Glo Herring to just check tissues, call for colpo appt

## 2020-01-08 ENCOUNTER — Other Ambulatory Visit: Payer: Self-pay

## 2020-01-08 ENCOUNTER — Encounter: Payer: Self-pay | Admitting: Gastroenterology

## 2020-01-08 ENCOUNTER — Telehealth (INDEPENDENT_AMBULATORY_CARE_PROVIDER_SITE_OTHER): Payer: Medicare Other | Admitting: Gastroenterology

## 2020-01-08 DIAGNOSIS — K219 Gastro-esophageal reflux disease without esophagitis: Secondary | ICD-10-CM | POA: Insufficient documentation

## 2020-01-08 DIAGNOSIS — K760 Fatty (change of) liver, not elsewhere classified: Secondary | ICD-10-CM | POA: Diagnosis not present

## 2020-01-08 NOTE — Progress Notes (Signed)
Primary Care Physician:  Jani Gravel, MD  Primary GI: Dr. Gala Romney   Patient Location: Home   Provider Location: [facility] office   Reason for Visit: Follow-up    Persons present on the virtual encounter, with roles: Patient and NP   Total time (minutes) spent on medical discussion: 10   Due to COVID-19, visit was conducted using virtual method.  Visit was requested by patient.  Virtual Visit via MyChart Video Note Due to COVID-19, visit is conducted virtually and was requested by patient.   I connected with Susan Baker on 01/08/20 at  9:30 AM EDT by telephone and verified that I am speaking with the correct person using two identifiers.   I discussed the limitations, risks, security and privacy concerns of performing an evaluation and management service by telephone and the availability of in person appointments. I also discussed with the patient that there may be a patient responsible charge related to this service. The patient expressed understanding and agreed to proceed.  Chief Complaint  Patient presents with  . Abdominal Pain    improved, occas pain every now and then  . Gastroesophageal Reflux    f/u. Doing okay     History of Present Illness: 59 year old female in follow-up for RUQ pain, undergoing EGD that was normal and subsequently cholecystectomy due to biliary dyskinesia. She has had long-standing RUQ pain dating back many years to at least 2012 but HIDAs were always "normal" but with subjective pain. Returning today in follow-up.  She is doing great, with abdominal pain resolved s/p cholecystectomy. Protonix daily. No significant diarrhea unless she eats something that triggers her. Has lost 12 lbs since surgery. Walking 4.5 miles per day. Changed diet.  Colonoscopy last in 2017. Heme negative recently.   Past Medical History:  Diagnosis Date  . Abnormal Pap smear   . Abnormal ThinPrep Pap test of vagina 04/25/2017   LSIL will get colp__________  .  Anxiety   . Bulging disc    cervical, [facility]  . Depression 02/18/2013  . Fibromyalgia   . GERD (gastroesophageal reflux disease)    egd with RE per patient, remote  . Hematuria 05/14/2014  . History of abnormal cervical Pap smear 02/24/2014  . Osteoarthritis (arthritis due to wear and tear of joints)   . Osteoporosis   . Osteoporosis, unspecified 03/03/2014  . Postcoital bleeding 09/15/2015  . Postmenopausal 02/24/2014  . PUD (peptic ulcer disease)    bleeding PUD per patient, remote, On Daypro  . Urinary frequency 05/14/2014  . Uterine cancer ([place])   . Vaginal Pap smear, abnormal      Past Surgical History:  Procedure Laterality Date  . CHOLECYSTECTOMY N/A 11/07/2019   Procedure: LAPAROSCOPIC CHOLECYSTECTOMY;  Surgeon: Virl Cagey, MD;  Location: [facility];  Service: General;  Laterality: N/A;  . COLONOSCOPY WITH PROPOFOL N/A 11/22/2015   Dr.Rourk- the entire examined colon is normal, the examined portion of the ileum was normal, non-bleeding internal hemorrhoids. no specimens collected.   Marland Kitchen ECTOPIC PREGNANCY SURGERY     twice  . ESOPHAGEAL DILATION N/A 11/22/2015   Procedure: ESOPHAGEAL DILATION;  Surgeon: Daneil Dolin, MD;  Location: [facility] SU[facility];  Service: Endoscopy;  Laterality: N/A;  . ESOPHAGOGASTRODUODENOSCOPY  12/01/2010   Dr. Gala Romney: mild distal ERE, antral erosions due to NSAIDS, no H.Pylori  . ESOPHAGOGASTRODUODENOSCOPY (EGD) WITH PROPOFOL N/A 11/22/2015   Dr.Rourk- grade A esophagitis, dilated. small hiatal hernia, normal stomach, normal second portion of the  duodenum.  . ESOPHAGOGASTRODUODENOSCOPY (EGD) WITH PROPOFOL N/A 10/27/2019   normal esophagus, normal stomach, normal duodenum.   . hysterectomy for uterine cancer     partial     Current Meds  Medication Sig  . FLUoxetine (PROZAC) 40 MG capsule Take 40 mg by mouth 2 (two) times daily.   Marland Kitchen OVER THE COUNTER MEDICATION Place 1 drop under the tongue daily as needed (pain).   . pantoprazole (PROTONIX) 40 MG  tablet TAKE 1 TABLET BY MOUTH ONCE DAILY 30  MINUTES  BEFORE  BREAKFAST  . pregabalin (LYRICA) 150 MG capsule Take 150 mg by mouth 2 (two) times daily.     Family History  Problem Relation Age of Onset  . Cirrhosis Father        etoh, died with liver cancer  . Diabetes Father   . Cirrhosis Paternal Uncle        multiple, etoh  . Pancreatic cancer Paternal Grandmother   . Diabetes Paternal Grandmother   . Kidney failure Mother   . Diabetes Mother   . Other Brother        crohn's disease  . Hyperlipidemia Brother   . Fibromyalgia Daughter   . Diabetes Maternal Grandmother   . Healthy Son   . Colon cancer Maternal Uncle   . Colon polyps Neg Hx     Social History   Socioeconomic History  . Marital status: Divorced    Spouse name: Not on file  . Number of children: 2  . Years of education: Not on file  . Highest education level: Not on file  Occupational History  . Occupation: disability  Tobacco Use  . Smoking status: Current Some Day Smoker    Packs/day: 0.50    Years: 31.00    Pack years: 15.50    Types: E-cigarettes  . Smokeless tobacco: Never Used  . Tobacco comment: using vape at times  Vaping Use  . Vaping Use: Every day  Substance and Sexual Activity  . Alcohol use: Yes    Alcohol/week: 0.0 standard drinks    Comment: occasional social use   . Drug use: No  . Sexual activity: Not Currently    Birth control/protection: Surgical    Comment: hysterectomy  Other Topics Concern  . Not on file  Social History Narrative  . Not on file   Social Determinants of Health   Financial Resource Strain:   . Difficulty of Paying Living Expenses:   Food Insecurity: No Food Insecurity  . Worried About Charity fundraiser in the Last Year: Never true  . Ran Out of Food in the Last Year: Never true  Transportation Needs: No Transportation Needs  . Lack of Transportation (Medical): No  . Lack of Transportation (Non-Medical): No  Physical Activity: Sufficiently Active   . Days of Exercise per Week: 5 days  . Minutes of Exercise per Session: 30 min  Stress: Stress Concern Present  . Feeling of Stress : Rather much  Social Connections: Moderately Isolated  . Frequency of Communication with Friends and Family: More than three times a week  . Frequency of Social Gatherings with Friends and Family: Twice a week  . Attends Religious Services: More than 4 times per year  . Active Member of Clubs or Organizations: No  . Attends Archivist Meetings: Never  . Marital Status: Divorced       Review of Systems: Gen: Denies fever, chills, anorexia. Denies fatigue, weakness, weight loss.  CV: Denies chest pain, palpitations,  syncope, peripheral edema, and claudication. Resp: Denies dyspnea at rest, cough, wheezing, coughing up blood, and pleurisy. GI: see HPI Derm: Denies rash, itching, dry skin Psych: Denies depression, anxiety, memory loss, confusion. No homicidal or suicidal ideation.  Heme: Denies bruising, bleeding, and enlarged lymph nodes.  Observations/Objective: No distress. Unable to perform physical exam due to video encounter. In good spirits.   Assessment and Plan: 59 year old female with chronic RUQ pain now resolved s/p cholecystectomy. EGD normal. She is doing well with Protonix once daily and has no concerning lower GI signs/symptoms. Recently found to be heme negative. Colonoscopy in 2027 unless clinical changes.  As she is doing well, we will see her back in 1 year.   Follow Up Instructions:    I discussed the assessment and treatment plan with the patient. The patient was provided an opportunity to ask questions and all were answered. The patient agreed with the plan and demonstrated an understanding of the instructions.   The patient was advised to call back or seek an in-person evaluation if the symptoms worsen or if the condition fails to improve as anticipated.  I provided 10 minutes of non-face-to-face time during this  encounter.  Annitta Needs, PhD, ANP-BC [facility]

## 2020-01-08 NOTE — Patient Instructions (Signed)
We will see you in 1 year!  Continue Protonix once daily, 30 minutes before breakfast.  Please call if any concerns in the interim!  I enjoyed seeing you again today! As you know, I value our relationship and want to provide genuine, compassionate, and quality care. I welcome your feedback. If you receive a survey regarding your visit,  I greatly appreciate you taking time to fill this out. See you next time!  Annitta Needs, PhD, ANP-BC [facility]

## 2020-01-14 ENCOUNTER — Telehealth: Payer: Self-pay | Admitting: *Deleted

## 2020-01-14 NOTE — Telephone Encounter (Signed)
Susan Baker, you are scheduled for a virtual visit with your provider today.  Just as we do with appointments in the office, we must obtain your consent to participate.  Your consent will be active for this visit and any virtual visit you may have with one of our providers in the next 365 days.  If you have a MyChart account, I can also send a copy of this consent to you electronically.  All virtual visits are billed to your insurance company just like a traditional visit in the office.  As this is a virtual visit, video technology does not allow for your provider to perform a traditional examination.  This may limit your provider's ability to fully assess your condition.  If your provider identifies any concerns that need to be evaluated in person or the need to arrange testing such as labs, EKG, etc, we will make arrangements to do so.  Although advances in technology are sophisticated, we cannot ensure that it will always work on either your end or our end.  If the connection with a video visit is poor, we may have to switch to a telephone visit.  With either a video or telephone visit, we are not always able to ensure that we have a secure connection.   I need to obtain your verbal consent now.   Are you willing to proceed with your visit today?  °

## 2020-01-14 NOTE — Telephone Encounter (Signed)
Pt consented to a virtual visit on 01/08/20.

## 2020-02-12 NOTE — Progress Notes (Deleted)
Office Visit Note  Patient: Susan Baker             Date of Birth: January 25, 1961           MRN: [id number]             PCP: Jani Gravel, MD Referring: Jani Gravel, MD Visit Date: 02/26/2020 Occupation: @GUAROCC @  Subjective:   History of Present Illness: Susan Baker is a 60 y.o. female with history of osteoarthritis.    Activities of Daily Living:  Patient reports morning stiffness for   {minute/hour:19697}.   Patient {ACTIONS;DENIES/REPORTS:21021675::"Denies"} nocturnal pain.  Difficulty dressing/grooming: {ACTIONS;DENIES/REPORTS:21021675::"Denies"} Difficulty climbing stairs: {ACTIONS;DENIES/REPORTS:21021675::"Denies"} Difficulty getting out of chair: {ACTIONS;DENIES/REPORTS:21021675::"Denies"} Difficulty using hands for taps, buttons, cutlery, and/or writing: {ACTIONS;DENIES/REPORTS:21021675::"Denies"}  Review of Systems  Constitutional: Negative for fatigue.  HENT: Negative for mouth sores, mouth dryness and nose dryness.   Eyes: Negative for pain, visual disturbance and dryness.  Respiratory: Negative for cough, hemoptysis, shortness of breath and difficulty breathing.   Cardiovascular: Negative for chest pain, palpitations, hypertension and swelling in legs/feet.  Gastrointestinal: Negative for blood in stool, constipation and diarrhea.  Endocrine: Negative for increased urination.  Genitourinary: Negative for painful urination.  Musculoskeletal: Negative for arthralgias, joint pain, joint swelling, myalgias, muscle weakness, morning stiffness, muscle tenderness and myalgias.  Skin: Negative for color change, pallor, rash, hair loss, nodules/bumps, skin tightness, ulcers and sensitivity to sunlight.  Allergic/Immunologic: Negative for susceptible to infections.  Neurological: Negative for dizziness, numbness, headaches and weakness.  Hematological: Negative for swollen glands.  Psychiatric/Behavioral: Negative for depressed mood and sleep disturbance. The patient is not  nervous/anxious.     PMFS History:  Patient Active Problem List   Diagnosis Date Noted  . GERD (gastroesophageal reflux disease) 01/08/2020  . Biliary dyskinesia 10/28/2019  . Rib pain on right side 10/22/2019  . Enlarged lymph node 09/02/2018  . Screening for colorectal cancer 09/02/2018  . Encounter for well woman exam with routine gynecological exam 09/02/2018  . Vaginal Pap smear following hysterectomy for malignancy 09/02/2018  . Abnormal ThinPrep Pap test of vagina 04/25/2017  . Fatigue 04/19/2017  . Well woman exam with routine gynecological exam 04/19/2017  . 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 Hematochezia   . Third degree hemorrhoids   . Hiatal hernia   . Dysphagia 11/02/2015  . Lymphadenopathy 11/02/2015  . Rectal bleeding 11/02/2015  . Postcoital bleeding 09/15/2015  . Urinary frequency 05/14/2014  . Hematuria 05/14/2014  . Abnormal Pap smear of vagina 03/04/2014  . Osteoporosis 03/03/2014  . History of abnormal cervical Pap smear 02/24/2014  . Postmenopausal 02/24/2014  . 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 Fibromyalgia 02/18/2013  . Depression 02/18/2013  . Osteoarthritis (arthritis due to wear and tear of joints) 02/18/2013  . Bulging disc 02/18/2013  . Nausea with vomiting 11/13/2012  . Reflux esophagitis 12/12/2010  . Gastric erosions 12/12/2010  . RUQ abdominal pain 11/07/2010  . Nausea 11/07/2010  . Pancreatic lesion 11/07/2010  . Fatty liver 11/07/2010  . Family history of cirrhosis of liver 11/07/2010  . Kidney cysts 11/07/2010    Past Medical History:  Diagnosis Date  . Abnormal Pap smear   . Abnormal ThinPrep Pap test of vagina 04/25/2017   LSIL will get colp__________  . Anxiety   . Bulging disc    cervical, [facility]  . Depression 02/18/2013  . Fibromyalgia   . GERD (gastroesophageal reflux disease)  egd with RE per patient, remote  . Hematuria 05/14/2014  . History  of abnormal cervical Pap smear 02/24/2014  . Osteoarthritis (arthritis due to wear and tear of joints)   . Osteoporosis   . Osteoporosis, unspecified 03/03/2014  . Postcoital bleeding 09/15/2015  . Postmenopausal 02/24/2014  . PUD (peptic ulcer disease)    bleeding PUD per patient, remote, On Daypro  . Urinary frequency 05/14/2014  . Uterine cancer ([place])   . Vaginal Pap smear, abnormal     Family History  Problem Relation Age of Onset  . Cirrhosis Father        etoh, died with liver cancer  . Diabetes Father   . Cirrhosis Paternal Uncle        multiple, etoh  . Pancreatic cancer Paternal Grandmother   . Diabetes Paternal Grandmother   . Kidney failure Mother   . Diabetes Mother   . Other Brother        crohn's disease  . Hyperlipidemia Brother   . Fibromyalgia Daughter   . Diabetes Maternal Grandmother   . Healthy Son   . Colon cancer Maternal Uncle   . Colon polyps Neg Hx    Past Surgical History:  Procedure Laterality Date  . CHOLECYSTECTOMY N/A 11/07/2019   Procedure: LAPAROSCOPIC CHOLECYSTECTOMY;  Surgeon: Virl Cagey, MD;  Location: [facility];  Service: General;  Laterality: N/A;  . COLONOSCOPY WITH PROPOFOL N/A 11/22/2015   Dr.Rourk- the entire examined colon is normal, the examined portion of the ileum was normal, non-bleeding internal hemorrhoids. no specimens collected.   Marland Kitchen ECTOPIC PREGNANCY SURGERY     twice  . ESOPHAGEAL DILATION N/A 11/22/2015   Procedure: ESOPHAGEAL DILATION;  Surgeon: Daneil Dolin, MD;  Location: [facility];  Service: Endoscopy;  Laterality: N/A;  . ESOPHAGOGASTRODUODENOSCOPY  12/01/2010   Dr. Gala Romney: mild distal ERE, antral erosions due to NSAIDS, no H.Pylori  . ESOPHAGOGASTRODUODENOSCOPY (EGD) WITH PROPOFOL N/A 11/22/2015   Dr.Rourk- grade A esophagitis, dilated. small hiatal hernia, normal stomach, normal second portion of the duodenum.  . ESOPHAGOGASTRODUODENOSCOPY (EGD) WITH PROPOFOL N/A 10/27/2019   normal esophagus, normal stomach,  normal duodenum.   . hysterectomy for uterine cancer     partial   Social History   Social History Narrative  . Not on file    There is no immunization history on file for this patient.   Objective: Vital Signs: There were no vitals taken for this visit.   Physical Exam Vitals and nursing note reviewed.  Constitutional:      Appearance: She is well-developed.  HENT:     Head: Normocephalic and atraumatic.  Eyes:     Conjunctiva/sclera: Conjunctivae normal.  Pulmonary:     Effort: Pulmonary effort is normal.  Abdominal:     Palpations: Abdomen is soft.  Musculoskeletal:     Cervical back: Normal range of motion.  Lymphadenopathy:     Cervical: No cervical adenopathy.  Skin:    General: Skin is warm and dry.     Capillary Refill: Capillary refill takes less than 2 seconds.  Neurological:     Mental Status: She is alert and oriented to person, place, and time.  Psychiatric:        Behavior: Behavior normal.      Musculoskeletal Exam: ***  CDAI Exam: CDAI Score: -- Patient Global: --; Provider Global: -- Swollen: --; Tender: -- Joint Exam 02/26/2020   No joint exam has been documented for this visit   There is currently no  information documented on the homunculus. Go to the Rheumatology activity and complete the homunculus joint exam.  Investigation: No additional findings.  Imaging: No results found.  Recent Labs: Lab Results  Component Value Date   WBC 6.4 07/16/2019   HGB 14.0 07/16/2019   PLT 229 07/16/2019   NA 136 10/10/2019   K 3.6 10/10/2019   CL 104 10/10/2019   CO2 24 10/10/2019   GLUCOSE 153 (H) 10/10/2019   BUN 13 10/10/2019   CREATININE 0.81 10/10/2019   BILITOT 0.4 10/10/2019   ALKPHOS 61 10/10/2019   AST 19 10/10/2019   ALT 23 10/10/2019   PROT 6.7 10/10/2019   ALBUMIN 4.0 10/10/2019   CALCIUM 9.3 10/10/2019   GFRAA >60 10/10/2019    Speciality Comments: No specialty comments available.  Procedures:  No procedures  performed Allergies: Cymbalta [duloxetine hcl], Daypro [oxaprozin], Sulfa antibiotics, Trazodone and nefazodone, and Nucynta [tapentadol]   Assessment / Plan:     Visit Diagnoses: No diagnosis found.  Orders: No orders of the defined types were placed in this encounter.  No orders of the defined types were placed in this encounter.   Face-to-face time spent with patient was *** minutes. Greater than 50% of time was spent in counseling and coordination of care.  Follow-Up Instructions: No follow-ups on file.   Earnestine Mealing, CMA  Note - This record has been created using Editor, commissioning.  Chart creation errors have been sought, but may not always  have been located. Such creation errors do not reflect on  the standard of medical care.

## 2020-02-17 ENCOUNTER — Telehealth: Payer: Self-pay | Admitting: Obstetrics and Gynecology

## 2020-02-17 NOTE — Telephone Encounter (Signed)
Patient called stating that Dr. Glo Herring was going to call in a cream for her to use before her colpo on 02/25/2020. Pt states that she did not pick it up before she went on her vacation. She was told to use it two weeks before her appointment. Pt states that Dr. Glo Herring needs to call it in and if it is okay for her to start using it now even though it is less than two weeks before appointment. Please contact pt

## 2020-02-18 ENCOUNTER — Encounter: Payer: Self-pay | Admitting: Adult Health

## 2020-02-21 NOTE — Telephone Encounter (Signed)
I have reviewed her past history. She had a Conization then vaginal hysterectomy, so margins should be clear.  I think at this point we can proceed with a pap without the vaginal cream with no concerns of over-interpretation of the pap results. Please let her know.

## 2020-02-23 NOTE — Telephone Encounter (Signed)
Left message @ 8:49 am. JSY

## 2020-02-23 NOTE — Telephone Encounter (Signed)
Just to clarify, it's for a colpo not a pap. Pt states last year was not able to do biopsy. Does she still not need the cream? Thanks!! [place]

## 2020-02-23 NOTE — Telephone Encounter (Signed)
I have reviewed her past history. She had a Conization then vaginal hysterectomy, so margins should be clear.  I think at this point we can proceed with a pap without the vaginal cream with no concerns of over-interpretation of the pap results.  A colposcopy is in more than most providers would feel is necessary, as she should have minimal risk , as described above.  Have her make an video appointment , or in person appointment.   As you know , I am not in the office til Wednesday  There is a limit to what I can answer by this sort of email communications.

## 2020-02-23 NOTE — Telephone Encounter (Signed)
Pt aware of Dr. Johnnye Sima recommendations. Call was transferred to front desk for appt. [place]

## 2020-02-24 ENCOUNTER — Telehealth: Payer: Self-pay | Admitting: Adult Health

## 2020-02-24 ENCOUNTER — Telehealth: Payer: Self-pay | Admitting: *Deleted

## 2020-02-24 DIAGNOSIS — M818 Other osteoporosis without current pathological fracture: Secondary | ICD-10-CM

## 2020-02-24 MED ORDER — IBANDRONATE SODIUM 150 MG PO TABS: 150.0000 mg | ORAL_TABLET | ORAL | 12 refills | Status: DC

## 2020-02-24 NOTE — Telephone Encounter (Signed)
Received DEXA results from [facility].  Date of Scan: 02/18/2020 Lowest T-score and site measured: -2.9 [facility] Spine Significant changes in BMD and site measured (5% and above): n/a  Current Regimen: Patient is not on treatment.   Recommendation: Schedule an appointment to discuss Dexa results and treatment options.   Copy in folder. Original sent to scan place.

## 2020-02-24 NOTE — Telephone Encounter (Signed)
Pt aware that bone density 02/18/20 showed osteoporosis, will rx boniva and recheck bone density in 1 year  She has tele visit with Dr Glo Herring in am to discuss pap and colpo or not

## 2020-02-25 ENCOUNTER — Encounter: Payer: Medicare Other | Admitting: Obstetrics and Gynecology

## 2020-02-25 ENCOUNTER — Other Ambulatory Visit: Payer: Self-pay

## 2020-02-25 ENCOUNTER — Telehealth: Payer: Medicare Other | Admitting: Obstetrics and Gynecology

## 2020-02-25 ENCOUNTER — Ambulatory Visit (HOSPITAL_COMMUNITY): Payer: Medicare Other

## 2020-02-25 NOTE — Telephone Encounter (Signed)
Patient is already scheduled for a return office visit on 02/26/2020. Appointment note modified to include Bone Density info.

## 2020-02-26 ENCOUNTER — Ambulatory Visit: Payer: Medicare Other | Admitting: Rheumatology

## 2020-02-26 DIAGNOSIS — N281 Cyst of kidney, acquired: Secondary | ICD-10-CM

## 2020-02-26 DIAGNOSIS — M47816 Spondylosis without myelopathy or radiculopathy, lumbar region: Secondary | ICD-10-CM

## 2020-02-26 DIAGNOSIS — Z8669 Personal history of other diseases of the nervous system and sense organs: Secondary | ICD-10-CM

## 2020-02-26 DIAGNOSIS — M19041 Primary osteoarthritis, right hand: Secondary | ICD-10-CM

## 2020-02-26 DIAGNOSIS — Z8379 Family history of other diseases of the digestive system: Secondary | ICD-10-CM

## 2020-02-26 DIAGNOSIS — K449 Diaphragmatic hernia without obstruction or gangrene: Secondary | ICD-10-CM

## 2020-02-26 DIAGNOSIS — Z86001 Personal history of in-situ neoplasm of cervix uteri: Secondary | ICD-10-CM

## 2020-02-26 DIAGNOSIS — F17200 Nicotine dependence, unspecified, uncomplicated: Secondary | ICD-10-CM

## 2020-02-26 DIAGNOSIS — M797 Fibromyalgia: Secondary | ICD-10-CM

## 2020-02-26 DIAGNOSIS — K257 Chronic gastric ulcer without hemorrhage or perforation: Secondary | ICD-10-CM

## 2020-02-26 DIAGNOSIS — K760 Fatty (change of) liver, not elsewhere classified: Secondary | ICD-10-CM

## 2020-02-26 DIAGNOSIS — M810 Age-related osteoporosis without current pathological fracture: Secondary | ICD-10-CM

## 2020-02-26 DIAGNOSIS — M19071 Primary osteoarthritis, right ankle and foot: Secondary | ICD-10-CM

## 2020-02-26 DIAGNOSIS — R768 Other specified abnormal immunological findings in serum: Secondary | ICD-10-CM

## 2020-02-26 DIAGNOSIS — L409 Psoriasis, unspecified: Secondary | ICD-10-CM

## 2020-02-26 DIAGNOSIS — K869 Disease of pancreas, unspecified: Secondary | ICD-10-CM

## 2020-02-26 DIAGNOSIS — M170 Bilateral primary osteoarthritis of knee: Secondary | ICD-10-CM

## 2020-03-04 ENCOUNTER — Ambulatory Visit (HOSPITAL_COMMUNITY): Payer: Medicare Other

## 2020-03-10 ENCOUNTER — Encounter: Payer: Medicare Other | Admitting: Obstetrics and Gynecology

## 2020-03-12 ENCOUNTER — Ambulatory Visit: Payer: Medicare Other | Admitting: Physician Assistant

## 2020-06-02 ENCOUNTER — Telehealth: Payer: Self-pay | Admitting: Radiology

## 2020-06-02 NOTE — Telephone Encounter (Signed)
Attempted to contact patient, left voicemail advising patient to call the office to schedule an appointment to discuss DEXA results and treatment options.

## 2020-07-01 ENCOUNTER — Telehealth: Payer: Self-pay | Admitting: *Deleted

## 2020-07-01 NOTE — Telephone Encounter (Signed)
Attempted to contact patient, left voicemail advising patient to call the office to schedule an appointment to discuss DEXA results and treatment options.  

## 2020-07-07 NOTE — Progress Notes (Deleted)
Office Visit Note  Patient: Susan Baker             Date of Birth: 04/11/1961           MRN: [id number]             PCP: Jani Gravel, MD Referring: Jani Gravel, MD Visit Date: 07/21/2020 Occupation: @GUAROCC @  Subjective:  No chief complaint on file.   History of Present Illness: Susan Baker is a 60 y.o. female ***   Activities of Daily Living:  Patient reports morning stiffness for *** {minute/hour:19697}.   Patient {ACTIONS;DENIES/REPORTS:21021675::"Denies"} nocturnal pain.  Difficulty dressing/grooming: {ACTIONS;DENIES/REPORTS:21021675::"Denies"} Difficulty climbing stairs: {ACTIONS;DENIES/REPORTS:21021675::"Denies"} Difficulty getting out of chair: {ACTIONS;DENIES/REPORTS:21021675::"Denies"} Difficulty using hands for taps, buttons, cutlery, and/or writing: {ACTIONS;DENIES/REPORTS:21021675::"Denies"}  No Rheumatology ROS completed.   PMFS History:  Patient Active Problem List   Diagnosis Date Noted  . GERD (gastroesophageal reflux disease) 01/08/2020  . Biliary dyskinesia 10/28/2019  . Rib pain on right side 10/22/2019  . Enlarged lymph node 09/02/2018  . Screening for colorectal cancer 09/02/2018  . Encounter for well woman exam with routine gynecological exam 09/02/2018  . Vaginal Pap smear following hysterectomy for malignancy 09/02/2018  . Abnormal ThinPrep Pap test of vagina 04/25/2017  . Fatigue 04/19/2017  . Well woman exam with routine gynecological exam 04/19/2017  . 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 Hematochezia   . Third degree hemorrhoids   . Hiatal hernia   . Dysphagia 11/02/2015  . Lymphadenopathy 11/02/2015  . Rectal bleeding 11/02/2015  . Postcoital bleeding 09/15/2015  . Urinary frequency 05/14/2014  . Hematuria 05/14/2014  . Abnormal Pap smear of vagina 03/04/2014  . Osteoporosis 03/03/2014  . History of abnormal cervical Pap smear 02/24/2014  . Postmenopausal 02/24/2014  . CIS (carcinoma in situ of cervix) 03/11/2013   . 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 Fibromyalgia 02/18/2013  . Depression 02/18/2013  . Osteoarthritis (arthritis due to wear and tear of joints) 02/18/2013  . Bulging disc 02/18/2013  . Nausea with vomiting 11/13/2012  . Reflux esophagitis 12/12/2010  . Gastric erosions 12/12/2010  . RUQ abdominal pain 11/07/2010  . Nausea 11/07/2010  . Pancreatic lesion 11/07/2010  . Fatty liver 11/07/2010  . Family history of cirrhosis of liver 11/07/2010  . Kidney cysts 11/07/2010    Past Medical History:  Diagnosis Date  . Abnormal Pap smear   . Abnormal ThinPrep Pap test of vagina 04/25/2017   LSIL will get colp__________  . Anxiety   . Bulging disc    cervical, [facility]  . Depression 02/18/2013  . Fibromyalgia   . GERD (gastroesophageal reflux disease)    egd with RE per patient, remote  . Hematuria 05/14/2014  . History of abnormal cervical Pap smear 02/24/2014  . Osteoarthritis (arthritis due to wear and tear of joints)   . Osteoporosis   . Osteoporosis, unspecified 03/03/2014  . Postcoital bleeding 09/15/2015  . Postmenopausal 02/24/2014  . PUD (peptic ulcer disease)    bleeding PUD per patient, remote, On Daypro  . Urinary frequency 05/14/2014  . Uterine cancer ([place])   . Vaginal Pap smear, abnormal     Family History  Problem Relation Age of Onset  . Cirrhosis Father        etoh, died with liver cancer  . Diabetes Father   . Cirrhosis Paternal Uncle        multiple, etoh  . Pancreatic cancer Paternal Grandmother   . Diabetes Paternal Grandmother   . Kidney  failure Mother   . Diabetes Mother   . Other Brother        crohn's disease  . Hyperlipidemia Brother   . Fibromyalgia Daughter   . Diabetes Maternal Grandmother   . Healthy Son   . Colon cancer Maternal Uncle   . Colon polyps Neg Hx    Past Surgical History:  Procedure Laterality Date  . CHOLECYSTECTOMY N/A 11/07/2019   Procedure: LAPAROSCOPIC CHOLECYSTECTOMY;  Surgeon: Lucretia Roers, MD;  Location: [facility];  Service: General;  Laterality: N/A;  . COLONOSCOPY WITH PROPOFOL N/A 11/22/2015   Dr.Rourk- the entire examined colon is normal, the examined portion of the ileum was normal, non-bleeding internal hemorrhoids. no specimens collected.   Marland Kitchen ECTOPIC PREGNANCY SURGERY     twice  . ESOPHAGEAL DILATION N/A 11/22/2015   Procedure: ESOPHAGEAL DILATION;  Surgeon: Corbin Ade, MD;  Location: [facility];  Service: Endoscopy;  Laterality: N/A;  . ESOPHAGOGASTRODUODENOSCOPY  12/01/2010   Dr. Jena Gauss: mild distal ERE, antral erosions due to NSAIDS, no H.Pylori  . ESOPHAGOGASTRODUODENOSCOPY (EGD) WITH PROPOFOL N/A 11/22/2015   Dr.Rourk- grade A esophagitis, dilated. small hiatal hernia, normal stomach, normal second portion of the duodenum.  . ESOPHAGOGASTRODUODENOSCOPY (EGD) WITH PROPOFOL N/A 10/27/2019   normal esophagus, normal stomach, normal duodenum.   . hysterectomy for uterine cancer     partial   Social History   Social History Narrative  . Not on file    There is no immunization history on file for this patient.   Objective: Vital Signs: There were no vitals taken for this visit.   Physical Exam   Musculoskeletal Exam: ***  CDAI Exam: CDAI Score: -- Patient Global: --; Provider Global: -- Swollen: --; Tender: -- Joint Exam 07/21/2020   No joint exam has been documented for this visit   There is currently no information documented on the homunculus. Go to the Rheumatology activity and complete the homunculus joint exam.  Investigation: No additional findings.  Imaging: No results found.  Recent Labs: Lab Results  Component Value Date   WBC 6.4 07/16/2019   HGB 14.0 07/16/2019   PLT 229 07/16/2019   NA 136 10/10/2019   K 3.6 10/10/2019   CL 104 10/10/2019   CO2 24 10/10/2019   GLUCOSE 153 (H) 10/10/2019   BUN 13 10/10/2019   CREATININE 0.81 10/10/2019   BILITOT 0.4 10/10/2019   ALKPHOS 61 10/10/2019   AST 19 10/10/2019   ALT 23 10/10/2019   PROT 6.7  10/10/2019   ALBUMIN 4.0 10/10/2019   CALCIUM 9.3 10/10/2019   GFRAA >60 10/10/2019   February 18, 2020 DEXA [facility] spine T score -2.9, BMD 0.725, no comparison available.  Speciality Comments: No specialty comments available.  Procedures:  No procedures performed Allergies: Cymbalta [duloxetine hcl], Daypro [oxaprozin], Sulfa antibiotics, Trazodone and nefazodone, and Nucynta [tapentadol]   Assessment / Plan:     Visit Diagnoses: Age-related osteoporosis without current pathological fracture - February 18, 2020 DEXA [facility] spine T score -2.9, BMD 0.725, no comparison available.  Patient is on Boniva 150 mg p.o. daily.  Primary osteoarthritis of both hands  Primary osteoarthritis of both knees  Primary osteoarthritis of both feet  Arthropathy of lumbar facet joint  Psoriasis  Positive ANA (antinuclear antibody)  Fibromyalgia  History of migraine headaches  Gastroesophageal reflux disease with esophagitis without hemorrhage  Chronic gastric erosion  Hiatal hernia  Pancreatic lesion  Fatty liver  Family history of cirrhosis of liver  Kidney cysts  History of carcinoma in situ of cervix  Smoker  Orders: No orders of the defined types were placed in this encounter.  No orders of the defined types were placed in this encounter.   Face-to-face time spent with patient was *** minutes. Greater than 50% of time was spent in counseling and coordination of care.  Follow-Up Instructions: No follow-ups on file.   Bo Merino, MD  Note - This record has been created using Editor, commissioning.  Chart creation errors have been sought, but may not always  have been located. Such creation errors do not reflect on  the standard of medical care.

## 2020-07-21 ENCOUNTER — Ambulatory Visit: Payer: Medicare Other | Admitting: Rheumatology

## 2020-07-21 DIAGNOSIS — Z86001 Personal history of in-situ neoplasm of cervix uteri: Secondary | ICD-10-CM

## 2020-07-21 DIAGNOSIS — K760 Fatty (change of) liver, not elsewhere classified: Secondary | ICD-10-CM

## 2020-07-21 DIAGNOSIS — K257 Chronic gastric ulcer without hemorrhage or perforation: Secondary | ICD-10-CM

## 2020-07-21 DIAGNOSIS — K2100 Gastro-esophageal reflux disease with esophagitis, without bleeding: Secondary | ICD-10-CM

## 2020-07-21 DIAGNOSIS — K449 Diaphragmatic hernia without obstruction or gangrene: Secondary | ICD-10-CM

## 2020-07-21 DIAGNOSIS — L409 Psoriasis, unspecified: Secondary | ICD-10-CM

## 2020-07-21 DIAGNOSIS — M810 Age-related osteoporosis without current pathological fracture: Secondary | ICD-10-CM

## 2020-07-21 DIAGNOSIS — Z8379 Family history of other diseases of the digestive system: Secondary | ICD-10-CM

## 2020-07-21 DIAGNOSIS — M170 Bilateral primary osteoarthritis of knee: Secondary | ICD-10-CM

## 2020-07-21 DIAGNOSIS — Z8669 Personal history of other diseases of the nervous system and sense organs: Secondary | ICD-10-CM

## 2020-07-21 DIAGNOSIS — R768 Other specified abnormal immunological findings in serum: Secondary | ICD-10-CM

## 2020-07-21 DIAGNOSIS — F17200 Nicotine dependence, unspecified, uncomplicated: Secondary | ICD-10-CM

## 2020-07-21 DIAGNOSIS — K869 Disease of pancreas, unspecified: Secondary | ICD-10-CM

## 2020-07-21 DIAGNOSIS — M47816 Spondylosis without myelopathy or radiculopathy, lumbar region: Secondary | ICD-10-CM

## 2020-07-21 DIAGNOSIS — M797 Fibromyalgia: Secondary | ICD-10-CM

## 2020-07-21 DIAGNOSIS — N281 Cyst of kidney, acquired: Secondary | ICD-10-CM

## 2020-07-21 DIAGNOSIS — M19071 Primary osteoarthritis, right ankle and foot: Secondary | ICD-10-CM

## 2020-07-21 DIAGNOSIS — M19041 Primary osteoarthritis, right hand: Secondary | ICD-10-CM

## 2020-07-27 NOTE — Progress Notes (Deleted)
Office Visit Note  Patient: Susan Baker             Date of Birth: April 08, 1961           MRN: [id number]             PCP: Jani Gravel, MD Referring: Jani Gravel, MD Visit Date: 08/10/2020 Occupation: @GUAROCC @  Subjective:  No chief complaint on file.   History of Present Illness: Susan Baker is a 60 y.o. female ***   Activities of Daily Living:  Patient reports morning stiffness for *** {minute/hour:19697}.   Patient {ACTIONS;DENIES/REPORTS:21021675::"Denies"} nocturnal pain.  Difficulty dressing/grooming: {ACTIONS;DENIES/REPORTS:21021675::"Denies"} Difficulty climbing stairs: {ACTIONS;DENIES/REPORTS:21021675::"Denies"} Difficulty getting out of chair: {ACTIONS;DENIES/REPORTS:21021675::"Denies"} Difficulty using hands for taps, buttons, cutlery, and/or writing: {ACTIONS;DENIES/REPORTS:21021675::"Denies"}  No Rheumatology ROS completed.   PMFS History:  Patient Active Problem List   Diagnosis Date Noted  . GERD (gastroesophageal reflux disease) 01/08/2020  . Biliary dyskinesia 10/28/2019  . Rib pain on right side 10/22/2019  . Enlarged lymph node 09/02/2018  . Screening for colorectal cancer 09/02/2018  . Encounter for well woman exam with routine gynecological exam 09/02/2018  . Vaginal Pap smear following hysterectomy for malignancy 09/02/2018  . Abnormal ThinPrep Pap test of vagina 04/25/2017  . Fatigue 04/19/2017  . Well woman exam with routine gynecological exam 04/19/2017  . 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 Hematochezia   . Third degree hemorrhoids   . Hiatal hernia   . Dysphagia 11/02/2015  . Lymphadenopathy 11/02/2015  . Rectal bleeding 11/02/2015  . Postcoital bleeding 09/15/2015  . Urinary frequency 05/14/2014  . Hematuria 05/14/2014  . Abnormal Pap smear of vagina 03/04/2014  . Osteoporosis 03/03/2014  . History of abnormal cervical Pap smear 02/24/2014  . Postmenopausal 02/24/2014  . CIS (carcinoma in situ of cervix) 03/11/2013   . 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 Fibromyalgia 02/18/2013  . Depression 02/18/2013  . Osteoarthritis (arthritis due to wear and tear of joints) 02/18/2013  . Bulging disc 02/18/2013  . Nausea with vomiting 11/13/2012  . Reflux esophagitis 12/12/2010  . Gastric erosions 12/12/2010  . RUQ abdominal pain 11/07/2010  . Nausea 11/07/2010  . Pancreatic lesion 11/07/2010  . Fatty liver 11/07/2010  . Family history of cirrhosis of liver 11/07/2010  . Kidney cysts 11/07/2010    Past Medical History:  Diagnosis Date  . Abnormal Pap smear   . Abnormal ThinPrep Pap test of vagina 04/25/2017   LSIL will get colp__________  . Anxiety   . Bulging disc    cervical, [facility]  . Depression 02/18/2013  . Fibromyalgia   . GERD (gastroesophageal reflux disease)    egd with RE per patient, remote  . Hematuria 05/14/2014  . History of abnormal cervical Pap smear 02/24/2014  . Osteoarthritis (arthritis due to wear and tear of joints)   . Osteoporosis   . Osteoporosis, unspecified 03/03/2014  . Postcoital bleeding 09/15/2015  . Postmenopausal 02/24/2014  . PUD (peptic ulcer disease)    bleeding PUD per patient, remote, On Daypro  . Urinary frequency 05/14/2014  . Uterine cancer ([place])   . Vaginal Pap smear, abnormal     Family History  Problem Relation Age of Onset  . Cirrhosis Father        etoh, died with liver cancer  . Diabetes Father   . Cirrhosis Paternal Uncle        multiple, etoh  . Pancreatic cancer Paternal Grandmother   . Diabetes Paternal Grandmother   . Kidney  failure Mother   . Diabetes Mother   . Other Brother        crohn's disease  . Hyperlipidemia Brother   . Fibromyalgia Daughter   . Diabetes Maternal Grandmother   . Healthy Son   . Colon cancer Maternal Uncle   . Colon polyps Neg Hx    Past Surgical History:  Procedure Laterality Date  . CHOLECYSTECTOMY N/A 11/07/2019   Procedure: LAPAROSCOPIC CHOLECYSTECTOMY;  Surgeon: Virl Cagey, MD;  Location: [facility];  Service: General;  Laterality: N/A;  . COLONOSCOPY WITH PROPOFOL N/A 11/22/2015   Dr.Rourk- the entire examined colon is normal, the examined portion of the ileum was normal, non-bleeding internal hemorrhoids. no specimens collected.   Marland Kitchen ECTOPIC PREGNANCY SURGERY     twice  . ESOPHAGEAL DILATION N/A 11/22/2015   Procedure: ESOPHAGEAL DILATION;  Surgeon: Daneil Dolin, MD;  Location: [facility];  Service: Endoscopy;  Laterality: N/A;  . ESOPHAGOGASTRODUODENOSCOPY  12/01/2010   Dr. Gala Romney: mild distal ERE, antral erosions due to NSAIDS, no H.Pylori  . ESOPHAGOGASTRODUODENOSCOPY (EGD) WITH PROPOFOL N/A 11/22/2015   Dr.Rourk- grade A esophagitis, dilated. small hiatal hernia, normal stomach, normal second portion of the duodenum.  . ESOPHAGOGASTRODUODENOSCOPY (EGD) WITH PROPOFOL N/A 10/27/2019   normal esophagus, normal stomach, normal duodenum.   . hysterectomy for uterine cancer     partial   Social History   Social History Narrative  . Not on file    There is no immunization history on file for this patient.   Objective: Vital Signs: There were no vitals taken for this visit.   Physical Exam   Musculoskeletal Exam: ***  CDAI Exam: CDAI Score: - Patient Global: -; Provider Global: - Swollen: -; Tender: - Joint Exam 08/10/2020   No joint exam has been documented for this visit   There is currently no information documented on the homunculus. Go to the Rheumatology activity and complete the homunculus joint exam.  Investigation: No additional findings.  Imaging: No results found.  Recent Labs: Lab Results  Component Value Date   WBC 6.4 07/16/2019   HGB 14.0 07/16/2019   PLT 229 07/16/2019   NA 136 10/10/2019   K 3.6 10/10/2019   CL 104 10/10/2019   CO2 24 10/10/2019   GLUCOSE 153 (H) 10/10/2019   BUN 13 10/10/2019   CREATININE 0.81 10/10/2019   BILITOT 0.4 10/10/2019   ALKPHOS 61 10/10/2019   AST 19 10/10/2019   ALT 23 10/10/2019   PROT 6.7 10/10/2019    ALBUMIN 4.0 10/10/2019   CALCIUM 9.3 10/10/2019   GFRAA >60 10/10/2019    Speciality Comments: No specialty comments available.  Procedures:  No procedures performed Allergies: Cymbalta [duloxetine hcl], Daypro [oxaprozin], Sulfa antibiotics, Trazodone and nefazodone, and Nucynta [tapentadol]   Assessment / Plan:     Visit Diagnoses: No diagnosis found.  Orders: No orders of the defined types were placed in this encounter.  No orders of the defined types were placed in this encounter.   Face-to-face time spent with patient was *** minutes. Greater than 50% of time was spent in counseling and coordination of care.  Follow-Up Instructions: No follow-ups on file.   Earnestine Mealing, CMA  Note - This record has been created using Editor, commissioning.  Chart creation errors have been sought, but may not always  have been located. Such creation errors do not reflect on  the standard of medical care.

## 2020-08-10 ENCOUNTER — Ambulatory Visit: Payer: Medicare Other | Admitting: Rheumatology

## 2020-08-10 DIAGNOSIS — F17200 Nicotine dependence, unspecified, uncomplicated: Secondary | ICD-10-CM

## 2020-08-10 DIAGNOSIS — M797 Fibromyalgia: Secondary | ICD-10-CM

## 2020-08-10 DIAGNOSIS — R768 Other specified abnormal immunological findings in serum: Secondary | ICD-10-CM

## 2020-08-10 DIAGNOSIS — Z8669 Personal history of other diseases of the nervous system and sense organs: Secondary | ICD-10-CM

## 2020-08-10 DIAGNOSIS — M810 Age-related osteoporosis without current pathological fracture: Secondary | ICD-10-CM

## 2020-08-10 DIAGNOSIS — K760 Fatty (change of) liver, not elsewhere classified: Secondary | ICD-10-CM

## 2020-08-10 DIAGNOSIS — M19041 Primary osteoarthritis, right hand: Secondary | ICD-10-CM

## 2020-08-10 DIAGNOSIS — M19071 Primary osteoarthritis, right ankle and foot: Secondary | ICD-10-CM

## 2020-08-10 DIAGNOSIS — L409 Psoriasis, unspecified: Secondary | ICD-10-CM

## 2020-08-10 DIAGNOSIS — K869 Disease of pancreas, unspecified: Secondary | ICD-10-CM

## 2020-08-10 DIAGNOSIS — Z86001 Personal history of in-situ neoplasm of cervix uteri: Secondary | ICD-10-CM

## 2020-08-10 DIAGNOSIS — M170 Bilateral primary osteoarthritis of knee: Secondary | ICD-10-CM

## 2020-08-10 DIAGNOSIS — M47816 Spondylosis without myelopathy or radiculopathy, lumbar region: Secondary | ICD-10-CM

## 2020-08-10 DIAGNOSIS — N281 Cyst of kidney, acquired: Secondary | ICD-10-CM

## 2020-08-10 DIAGNOSIS — K2100 Gastro-esophageal reflux disease with esophagitis, without bleeding: Secondary | ICD-10-CM

## 2020-08-10 DIAGNOSIS — K449 Diaphragmatic hernia without obstruction or gangrene: Secondary | ICD-10-CM

## 2020-08-10 DIAGNOSIS — K257 Chronic gastric ulcer without hemorrhage or perforation: Secondary | ICD-10-CM

## 2020-08-10 DIAGNOSIS — Z8379 Family history of other diseases of the digestive system: Secondary | ICD-10-CM

## 2020-09-02 ENCOUNTER — Encounter: Payer: Self-pay | Admitting: *Deleted

## 2020-12-01 ENCOUNTER — Other Ambulatory Visit: Payer: Self-pay | Admitting: Gastroenterology

## 2020-12-01 DIAGNOSIS — K257 Chronic gastric ulcer without hemorrhage or perforation: Secondary | ICD-10-CM

## 2020-12-01 DIAGNOSIS — K2100 Gastro-esophageal reflux disease with esophagitis, without bleeding: Secondary | ICD-10-CM

## 2020-12-28 ENCOUNTER — Encounter: Payer: Self-pay | Admitting: Internal Medicine

## 2020-12-29 ENCOUNTER — Encounter: Payer: Self-pay | Admitting: Internal Medicine

## 2021-03-01 ENCOUNTER — Other Ambulatory Visit: Payer: Self-pay

## 2021-03-01 ENCOUNTER — Ambulatory Visit (HOSPITAL_COMMUNITY)
Admission: RE | Admit: 2021-03-01 | Discharge: 2021-03-01 | Disposition: A | Discharge status: Home / Self Care | Payer: Medicare Other | Source: Ambulatory Visit | Attending: Family Medicine | Admitting: Family Medicine

## 2021-03-01 ENCOUNTER — Other Ambulatory Visit (HOSPITAL_COMMUNITY): Payer: Self-pay | Admitting: Family Medicine

## 2021-03-01 DIAGNOSIS — M546 Pain in thoracic spine: Secondary | ICD-10-CM | POA: Insufficient documentation

## 2021-04-14 ENCOUNTER — Other Ambulatory Visit: Payer: Self-pay | Admitting: Family Medicine

## 2021-04-14 ENCOUNTER — Other Ambulatory Visit (HOSPITAL_COMMUNITY): Payer: Self-pay | Admitting: Family Medicine

## 2021-04-14 DIAGNOSIS — M546 Pain in thoracic spine: Secondary | ICD-10-CM

## 2021-04-14 DIAGNOSIS — M542 Cervicalgia: Secondary | ICD-10-CM

## 2021-04-14 DIAGNOSIS — M545 Low back pain, unspecified: Secondary | ICD-10-CM

## 2021-04-28 ENCOUNTER — Ambulatory Visit (HOSPITAL_COMMUNITY)
Admission: RE | Admit: 2021-04-28 | Discharge: 2021-04-28 | Disposition: A | Discharge status: Home / Self Care | Payer: Medicare Other | Source: Ambulatory Visit | Attending: Family Medicine | Admitting: Family Medicine

## 2021-04-28 ENCOUNTER — Other Ambulatory Visit: Payer: Self-pay

## 2021-04-28 DIAGNOSIS — M545 Low back pain, unspecified: Secondary | ICD-10-CM | POA: Diagnosis present

## 2021-04-28 DIAGNOSIS — M542 Cervicalgia: Secondary | ICD-10-CM | POA: Insufficient documentation

## 2021-04-28 DIAGNOSIS — M546 Pain in thoracic spine: Secondary | ICD-10-CM | POA: Insufficient documentation

## 2021-05-24 ENCOUNTER — Other Ambulatory Visit: Payer: Medicare Other | Admitting: Adult Health

## 2021-06-01 ENCOUNTER — Other Ambulatory Visit (HOSPITAL_COMMUNITY): Payer: Self-pay | Admitting: Adult Health

## 2021-06-01 DIAGNOSIS — Z1231 Encounter for screening mammogram for malignant neoplasm of breast: Secondary | ICD-10-CM

## 2021-06-09 ENCOUNTER — Ambulatory Visit (INDEPENDENT_AMBULATORY_CARE_PROVIDER_SITE_OTHER): Payer: Medicare Other | Admitting: Adult Health

## 2021-06-09 ENCOUNTER — Other Ambulatory Visit: Payer: Self-pay

## 2021-06-09 ENCOUNTER — Other Ambulatory Visit (HOSPITAL_COMMUNITY)
Admission: RE | Admit: 2021-06-09 | Discharge: 2021-06-09 | Disposition: A | Discharge status: Home / Self Care | Payer: Medicare Other | Source: Ambulatory Visit | Attending: Adult Health | Admitting: Adult Health

## 2021-06-09 ENCOUNTER — Encounter: Payer: Self-pay | Admitting: Adult Health

## 2021-06-09 VITALS — BP 135/85 | HR 103 | Ht 62.5 in | Wt 166.0 lb

## 2021-06-09 DIAGNOSIS — Z1151 Encounter for screening for human papillomavirus (HPV): Secondary | ICD-10-CM | POA: Diagnosis not present

## 2021-06-09 DIAGNOSIS — Z08 Encounter for follow-up examination after completed treatment for malignant neoplasm: Secondary | ICD-10-CM

## 2021-06-09 DIAGNOSIS — R87620 Atypical squamous cells of undetermined significance on cytologic smear of vagina (ASC-US): Secondary | ICD-10-CM | POA: Diagnosis not present

## 2021-06-09 DIAGNOSIS — Z90710 Acquired absence of both cervix and uterus: Secondary | ICD-10-CM | POA: Diagnosis not present

## 2021-06-09 DIAGNOSIS — Z8541 Personal history of malignant neoplasm of cervix uteri: Secondary | ICD-10-CM | POA: Insufficient documentation

## 2021-06-09 DIAGNOSIS — Z86001 Personal history of in-situ neoplasm of cervix uteri: Secondary | ICD-10-CM

## 2021-06-09 DIAGNOSIS — Z01419 Encounter for gynecological examination (general) (routine) without abnormal findings: Secondary | ICD-10-CM | POA: Diagnosis present

## 2021-06-09 NOTE — Progress Notes (Signed)
  Subjective:     Patient ID: Susan PICKING, female   DOB: [birth year], 60 y.o.   MRN: [id number]  HPI Susan Baker is a 60 year old white female,divorced,sp vaginal hysterectomy, in complaining of area left labia that has resolved, and wants pap. She has been seen at [facility], having back issues and pain. Lab Results  Component Value Date   DIAGPAP - Low grade squamous intraepithelial lesion (LSIL) (A) 12/22/2019   HPV NOT DETECTED 09/02/2018   Rainbow Negative 12/22/2019    PCP is [facility].  Review of Systems Had vaginal lesion, resolved +back pain, to see pain clinic Reviewed past medical,surgical, social and family history. Reviewed medications and allergies.     Objective:   Physical Exam BP 135/85 (BP Location: Right Arm, Patient Position: Sitting, Cuff Size: Normal)   Pulse (!) 103   Ht 5' 2.5" (1.588 m)   Wt 166 lb (75.3 kg)   BMI 29.88 kg/m     Skin warm and dry.Pelvic: external genitalia is normal in appearance no lesions, vagina: pale and loss of moisture and rugae,urethra has no lesions or masses noted, cervix and uterus are absent,pap performed on vagina with HR HPV,  adnexa: no masses or tenderness noted. Bladder is non tender and no masses felt.   Upstream - 06/09/21 1441       Pregnancy Intention Screening   Does the patient want to become pregnant in the next year? No    Does the patient's partner want to become pregnant in the next year? No    Would the patient like to discuss contraceptive options today? No      Contraception Wrap Up   Current Method --   hyst   End Method --   hyst   Contraception Counseling Provided No            Examination chaperoned by Celene Squibb LPN  Assessment:     1. Vaginal Pap smear following hysterectomy for malignancy Pap sent Mammogram 07/14/21 Colonoscopy per GI Labs with PCP   2. History of carcinoma in situ of cervix     Plan:     GYN exam in 1 year

## 2021-06-17 LAB — CYTOLOGY - PAP
Comment: NEGATIVE
Diagnosis: UNDETERMINED — AB
High risk HPV: NEGATIVE

## 2021-06-30 ENCOUNTER — Telehealth: Payer: Medicare Other | Admitting: Internal Medicine

## 2021-06-30 ENCOUNTER — Other Ambulatory Visit: Payer: Self-pay

## 2021-07-14 ENCOUNTER — Ambulatory Visit (HOSPITAL_COMMUNITY): Payer: Medicare Other

## 2021-07-14 ENCOUNTER — Other Ambulatory Visit: Payer: Medicare Other | Admitting: Adult Health

## 2021-08-28 NOTE — Progress Notes (Addendum)
Primary Care Physician:  Jani Gravel, MD Primary GI:  Garfield Cornea, MD Patient Location: Home Provider Location: [facility] office  Reason for Visit:  Chief Complaint  Patient presents with   Gastroesophageal Reflux    Refills   Rectal Bleeding    Seen once last week after having a bm, may be from hemorrhoid.    Persons present on the virtual encounter, with roles: Patient, myself (provider), Tammy Clifton CMA(updated meds and allergies)  Total time (minutes) spent on medical discussion: 15 minutes  Due to COVID-19, visit was conducted using Mychart video method.  Visit was requested by patient.  Virtual Visit via Mychart video  I connected with Susan Baker on 08/29/21 at  1:00 PM EST by Mychart video and verified that I am speaking with the correct person using two identifiers.   I discussed the limitations, risks, security and privacy concerns of performing an evaluation and management service by telephone/video and the availability of in person appointments. I also discussed with the patient that there may be a patient responsible charge related to this service. The patient expressed understanding and agreed to proceed.   HPI:   Susan Baker is a 61 y.o. female who presents for virtual visit regarding office visit for refills, GERD.    Patient last seen in 01/2020. H/o RUQ pain, underwent EGD April 2021 that was normal and subsequently completed cholecystectomy due to biliary dyskinesia. RUQ pain dating back for years at least to 2012. She has h/o GERD.  Last colonoscopy May 2017: Normal, terminal ileum normal, nonbleeding internal hemorrhoids.  Due for colonoscopy in 2027.  Patient presents today stating that she continues to have right flank pain/right upper quadrant pain off and on.  Recently seen in the ED at [facility] for right flank pain.  Was told that likely had musculoskeletal cause of her pain possibly from her back or arthritis.  Patient states she seen orthopedic in  [facility] who reports that they really cannot provide her with any medications such as anti-inflammatories because of her prior GI history.  She is also considering biologic therapy (Tremfya( for psoriatic arthritis, has a follow-up with her dermatologist next month.     As far as right flank pain, she states it is worse when she bends forward or bends over.  Gets a sharp pain.  She just started going to a pain clinic in [place], Dr. Vira Blanco and will be getting injections soon.  Denies any postprandial component.  No vomiting.  States her heartburn is well controlled.  No dysphagia. has taken prednisone for this pain with relief.  Bowel movements are regular.  Typically 1 stool a day.  No hard stools or straining.  Single episode of bright red blood noted on the toilet tissue recently.   Just had MRIs cervical, thoracic, lumbar spine done in 04/2021. Being treated for DDD multilevel.        Current Outpatient Medications  Medication Sig Dispense Refill   FLUoxetine (PROZAC) 40 MG capsule Take 40 mg by mouth 2 (two) times daily.      HYDROcodone-acetaminophen (NORCO/VICODIN) 5-325 MG tablet Take 1 tablet by mouth every 4 (four) hours as needed for moderate pain. Take 1 tablet 4 times daily     pantoprazole (PROTONIX) 40 MG tablet TAKE 1 TABLET BY MOUTH ONCE DAILY 30 MINUTES WITH BREAKFAST 90 tablet 3   pregabalin (LYRICA) 150 MG capsule Take 150 mg by mouth 2 (two) times daily.     No  current facility-administered medications for this visit.    Past Medical History:  Diagnosis Date   Abnormal Pap smear    Abnormal ThinPrep Pap test of vagina 04/25/2017   LSIL will get colp__________   Anxiety    Bulging disc    cervical, Pain Clinic   Depression 02/18/2013   Fibromyalgia    GERD (gastroesophageal reflux disease)    egd with RE per patient, remote   Hematuria 05/14/2014   History of abnormal cervical Pap smear 02/24/2014   Osteoarthritis (arthritis due to wear and tear of joints)     Osteoporosis    Osteoporosis, unspecified 03/03/2014   Postcoital bleeding 09/15/2015   Postmenopausal 02/24/2014   PUD (peptic ulcer disease)    bleeding PUD per patient, remote, On Daypro   Urinary frequency 05/14/2014   Uterine cancer ([place])    Vaginal Pap smear, abnormal     Past Surgical History:  Procedure Laterality Date   CHOLECYSTECTOMY N/A 11/07/2019   Procedure: LAPAROSCOPIC CHOLECYSTECTOMY;  Surgeon: Virl Cagey, MD;  Location: [facility];  Service: General;  Laterality: N/A;   COLONOSCOPY WITH PROPOFOL N/A 11/22/2015   Dr.Rourk- the entire examined colon is normal, the examined portion of the ileum was normal, non-bleeding internal hemorrhoids. no specimens collected.    ECTOPIC PREGNANCY SURGERY     twice   ESOPHAGEAL DILATION N/A 11/22/2015   Procedure: ESOPHAGEAL DILATION;  Surgeon: Daneil Dolin, MD;  Location: [facility] SUITE;  Service: Endoscopy;  Laterality: N/A;   ESOPHAGOGASTRODUODENOSCOPY  12/01/2010   Dr. Gala Romney: mild distal ERE, antral erosions due to NSAIDS, no H.Pylori   ESOPHAGOGASTRODUODENOSCOPY (EGD) WITH PROPOFOL N/A 11/22/2015   Dr.Rourk- grade A esophagitis, dilated. small hiatal hernia, normal stomach, normal second portion of the duodenum.   ESOPHAGOGASTRODUODENOSCOPY (EGD) WITH PROPOFOL N/A 10/27/2019   normal esophagus, normal stomach, normal duodenum.    hysterectomy for uterine cancer     partial    Family History  Problem Relation Age of Onset   Cirrhosis Father        etoh, died with liver cancer   Diabetes Father    Cirrhosis Paternal Uncle        multiple, etoh   Pancreatic cancer Paternal Grandmother    Diabetes Paternal Grandmother    Kidney failure Mother    Diabetes Mother    Other Brother        crohn's disease   Hyperlipidemia Brother    Fibromyalgia Daughter    Diabetes Maternal Grandmother    Healthy Son    Colon cancer Maternal Uncle    Colon polyps Neg Hx     Social History   Socioeconomic History   Marital status:  Divorced    Spouse name: Not on file   Number of children: 2   Years of education: Not on file   Highest education level: Not on file  Occupational History   Occupation: disability  Tobacco Use   Smoking status: Former    Packs/day: 0.50    Years: 31.00    Pack years: 15.50    Types: E-cigarettes, Cigarettes   Smokeless tobacco: Never   Tobacco comments:    using vape at times  Vaping Use   Vaping Use: Every day  Substance and Sexual Activity   Alcohol use: Yes    Alcohol/week: 0.0 standard drinks    Comment: occasional social use    Drug use: No   Sexual activity: Not Currently    Birth control/protection: Surgical    Comment:  hysterectomy  Other Topics Concern   Not on file  Social History Narrative   Not on file   Social Determinants of Health   Financial Resource Strain: Not on file  Food Insecurity: Not on file  Transportation Needs: Not on file  Physical Activity: Not on file  Stress: Not on file  Social Connections: Not on file  Intimate Partner Violence: Not on file      ROS:  General: Negative for anorexia, weight loss, fever, chills, fatigue, weakness. Eyes: Negative for vision changes.  ENT: Negative for hoarseness, difficulty swallowing , nasal congestion. CV: Negative for chest pain, angina, palpitations, dyspnea on exertion, peripheral edema.  Respiratory: Negative for dyspnea at rest, dyspnea on exertion, cough, sputum, wheezing.  GI: See history of present illness. GU:  Negative for dysuria, hematuria, urinary incontinence, urinary frequency, nocturnal urination.  MS: positive for joint pain, low back pain.  See HPI Derm: Negative for rash or itching.  Neuro: Negative for weakness, abnormal sensation, seizure, frequent headaches, memory loss, confusion.  Psych: Negative for anxiety, depression, suicidal ideation, hallucinations.  Endo: Negative for unusual weight change.  Heme: Negative for bruising or bleeding. Allergy: Negative for rash or  hives.   Observations/Objective: Pleasant, well-nourished and developed female in no acute distress.  No shortness of breath.  Alert morning.  Otherwise exam unavailable.   Assessment and Plan:  GERD: Well-controlled on pantoprazole daily.  Continue current regimen.  History of erosions in the past due to NSAIDs.  Patient wondering if NSAIDs would ever be an option for her again.  As outlined with patient today, cannot guarantee that she would not develop erosions/ulcers in the setting of NSAIDs but would advise concomitant treatment with PPI therapy if she were to need NSAIDs in the future.  Would have to monitor her closely. Would use if benefits outweigh potential risk.  Rectal bleeding: Single episode, toilet tissue hematochezia.  Likely due to internal hemorrhoids.  She will continue to monitor, if symptoms become regular or more frequent, would consider updating colonoscopy.  Return to the office in 1 year or call sooner if needed.  Follow Up Instructions:    I discussed the assessment and treatment plan with the patient. The patient was provided an opportunity to ask questions and all were answered. The patient agreed with the plan and demonstrated an understanding of the instructions. AVS mailed to patient's home address.   The patient was advised to call back or seek an in-person evaluation if the symptoms worsen or if the condition fails to improve as anticipated.  I provided 15 minutes of virtual face-to-face time during this encounter.   Neil Crouch, PA-C

## 2021-08-29 ENCOUNTER — Telehealth: Payer: Self-pay | Admitting: *Deleted

## 2021-08-29 ENCOUNTER — Other Ambulatory Visit: Payer: Self-pay

## 2021-08-29 ENCOUNTER — Encounter: Payer: Self-pay | Admitting: Gastroenterology

## 2021-08-29 ENCOUNTER — Telehealth (INDEPENDENT_AMBULATORY_CARE_PROVIDER_SITE_OTHER): Payer: Medicare Other | Admitting: Gastroenterology

## 2021-08-29 DIAGNOSIS — K257 Chronic gastric ulcer without hemorrhage or perforation: Secondary | ICD-10-CM | POA: Diagnosis not present

## 2021-08-29 DIAGNOSIS — K2100 Gastro-esophageal reflux disease with esophagitis, without bleeding: Secondary | ICD-10-CM

## 2021-08-29 MED ORDER — PANTOPRAZOLE SODIUM 40 MG PO TBEC: DELAYED_RELEASE_TABLET | ORAL | 3 refills | Status: DC

## 2021-08-29 NOTE — Telephone Encounter (Signed)
Pt consented to a virtual visit. 

## 2021-08-29 NOTE — Telephone Encounter (Signed)
Susan Baker, you are scheduled for a virtual visit with your provider today.  Just as we do with appointments in the office, we must obtain your consent to participate.  Your consent will be active for this visit and any virtual visit you may have with one of our providers in the next 365 days.  If you have a MyChart account, I can also send a copy of this consent to you electronically.  All virtual visits are billed to your insurance company just like a traditional visit in the office.  As this is a virtual visit, video technology does not allow for your provider to perform a traditional examination.  This may limit your provider's ability to fully assess your condition.  If your provider identifies any concerns that need to be evaluated in person or the need to arrange testing such as labs, EKG, etc, we will make arrangements to do so.  Although advances in technology are sophisticated, we cannot ensure that it will always work on either your end or our end.  If the connection with a video visit is poor, we may have to switch to a telephone visit.  With either a video or telephone visit, we are not always able to ensure that we have a secure connection.   I need to obtain your verbal consent now.   Are you willing to proceed with your visit today?

## 2021-08-29 NOTE — Patient Instructions (Addendum)
Continue pantoprazole 40 mg daily.  New prescription sent to your pharmacy. Monitor for recurrent or frequent rectal bleeding.  Let me know if this occurs, at that time would consider updating colonoscopy. Return to the office in 1 year or call sooner if needed.

## 2021-09-27 ENCOUNTER — Ambulatory Visit: Payer: Medicare Other | Admitting: Gastroenterology

## 2021-11-16 ENCOUNTER — Other Ambulatory Visit (HOSPITAL_COMMUNITY): Payer: Self-pay | Admitting: Nurse Practitioner

## 2021-11-16 ENCOUNTER — Ambulatory Visit (HOSPITAL_COMMUNITY)
Admission: RE | Admit: 2021-11-16 | Discharge: 2021-11-16 | Disposition: A | Discharge status: Home / Self Care | Payer: Medicare Other | Source: Ambulatory Visit | Attending: Nurse Practitioner | Admitting: Nurse Practitioner

## 2021-11-16 DIAGNOSIS — R059 Cough, unspecified: Secondary | ICD-10-CM

## 2021-11-16 DIAGNOSIS — R0602 Shortness of breath: Secondary | ICD-10-CM | POA: Diagnosis not present

## 2021-11-22 ENCOUNTER — Encounter: Payer: Self-pay | Admitting: Gastroenterology

## 2021-11-22 ENCOUNTER — Ambulatory Visit (INDEPENDENT_AMBULATORY_CARE_PROVIDER_SITE_OTHER): Payer: Medicare Other | Admitting: Gastroenterology

## 2021-11-22 ENCOUNTER — Other Ambulatory Visit: Payer: Self-pay | Admitting: Gastroenterology

## 2021-11-22 VITALS — BP 118/60 | HR 100 | Temp 97.5°F | Ht 62.0 in | Wt 170.6 lb

## 2021-11-22 DIAGNOSIS — K2100 Gastro-esophageal reflux disease with esophagitis, without bleeding: Secondary | ICD-10-CM

## 2021-11-22 DIAGNOSIS — R918 Other nonspecific abnormal finding of lung field: Secondary | ICD-10-CM | POA: Insufficient documentation

## 2021-11-22 DIAGNOSIS — R1013 Epigastric pain: Secondary | ICD-10-CM

## 2021-11-22 DIAGNOSIS — R1310 Dysphagia, unspecified: Secondary | ICD-10-CM | POA: Diagnosis not present

## 2021-11-22 MED ORDER — PANTOPRAZOLE SODIUM 40 MG PO TBEC: 40.0000 mg | DELAYED_RELEASE_TABLET | Freq: Two times a day (BID) | ORAL | 3 refills | Status: DC

## 2021-11-22 MED ORDER — ONDANSETRON 4 MG PO TBDP: 4.0000 mg | ORAL_TABLET | Freq: Three times a day (TID) | ORAL | 1 refills | Status: DC | PRN

## 2021-11-22 NOTE — Patient Instructions (Signed)
I have ordered a CT chest.  We will also do an upper endoscopy with dilation once we review the CT chest.  I have sent in Zofran for nausea.  You can take the pantoprazole twice a day, 30 minutes before meals. I have sent that in as well.  Further recommendations to follow!  I enjoyed seeing you again today! As you know, I value our relationship and want to provide genuine, compassionate, and quality care. I welcome your feedback. If you receive a survey regarding your visit,  I greatly appreciate you taking time to fill this out. See you next time!  Annitta Needs, PhD, ANP-BC [facility]

## 2021-11-22 NOTE — Progress Notes (Signed)
Gastroenterology Office Note     Primary Care Physician:  Jani Gravel, MD  Primary Gastroenterologist: Dr. Gala Romney    Chief Complaint   Chief Complaint  Patient presents with   Follow-up    Coughing up blood 3 to 4 weeks, RUQ pain x years worse past few months, pt also have left side pain     History of Present Illness   Susan Baker is a 61 y.o. female presenting today in follow-up with a history of chronic RUQ pain, undergoing EGD April 2021 that was normal and subsequently completed cholecystectomy due to biliary dyskinesia. RUQ pain dating back for years at least to 2012. She has h/o GERD.  Last colonoscopy May 2017: Normal, terminal ileum normal, nonbleeding internal hemorrhoids.  Due for colonoscopy in 2027.   Outside CT chest/abdomen/pelvis Oct 2022 without etiology for right-sided pain. Multiple groundlgass nodules in right lung measuring up to 0.6 cm was seen. Follow-up chest CT recommended. She has a known fatty liver.    She notes in Feb 2023, she filled the commode with blood. Started taking meloxicam in March/April.  Was having right and left flank pain. When hits and catches, can't get up out of the floor. Herniated disc issues. Seeing a Pain clinic. Was put on oxycontin.  Feels bloated, swollen, feels like she is swollen in RUQ. Also having pain in LUQ. Spitting up blood about a month ago. Called here but never got a call back. No phone call documented. Saw PCP: Blenda Nicely, NP. Got CXR.  Showed minimal hazy opacity in lateral left lung base, early infiltrate recommended. Put on doxycycline.   This morning felt globus sensation in throat. Spitting out blood. Sometimes blood will just come up with spitting. Sometimes clears throat and comes up. Comes up with phlegm. Occasional Ibuprofen for headaches. Finished prednisone a few months ago.   Pantoprazole once daily. Told by PCP to take BID.    Vapes.    Past Medical History:  Diagnosis Date   Abnormal Pap  smear    Abnormal ThinPrep Pap test of vagina 04/25/2017   LSIL will get colp__________   Anxiety    Bulging disc    cervical, Pain Clinic   Depression 02/18/2013   Fibromyalgia    GERD (gastroesophageal reflux disease)    egd with RE per patient, remote   Hematuria 05/14/2014   History of abnormal cervical Pap smear 02/24/2014   Osteoarthritis (arthritis due to wear and tear of joints)    Osteoporosis    Osteoporosis, unspecified 03/03/2014   Postcoital bleeding 09/15/2015   Postmenopausal 02/24/2014   PUD (peptic ulcer disease)    bleeding PUD per patient, remote, On Daypro   Urinary frequency 05/14/2014   Uterine cancer ([place])    Vaginal Pap smear, abnormal     Past Surgical History:  Procedure Laterality Date   CHOLECYSTECTOMY N/A 11/07/2019   Procedure: LAPAROSCOPIC CHOLECYSTECTOMY;  Surgeon: Virl Cagey, MD;  Location: [facility];  Service: General;  Laterality: N/A;   COLONOSCOPY WITH PROPOFOL N/A 11/22/2015   Dr.Rourk- the entire examined colon is normal, the examined portion of the ileum was normal, non-bleeding internal hemorrhoids. no specimens collected.    ECTOPIC PREGNANCY SURGERY     twice   ESOPHAGEAL DILATION N/A 11/22/2015   Procedure: ESOPHAGEAL DILATION;  Surgeon: Daneil Dolin, MD;  Location: [facility];  Service: Endoscopy;  Laterality: N/A;   ESOPHAGOGASTRODUODENOSCOPY  12/01/2010   Dr. Gala Romney: mild distal ERE, antral erosions  due to NSAIDS, no H.Pylori   ESOPHAGOGASTRODUODENOSCOPY (EGD) WITH PROPOFOL N/A 11/22/2015   Dr.Rourk- grade A esophagitis, dilated. small hiatal hernia, normal stomach, normal second portion of the duodenum.   ESOPHAGOGASTRODUODENOSCOPY (EGD) WITH PROPOFOL N/A 10/27/2019   normal esophagus, normal stomach, normal duodenum.    hysterectomy for uterine cancer     partial    Current Outpatient Medications  Medication Sig Dispense Refill   FLUoxetine (PROZAC) 40 MG capsule Take 40 mg by mouth 2 (two) times daily.       HYDROcodone-acetaminophen (NORCO/VICODIN) 5-325 MG tablet Take 1 tablet by mouth every 4 (four) hours as needed for moderate pain. Take 1 tablet 4 times daily     pantoprazole (PROTONIX) 40 MG tablet TAKE 1 TABLET BY MOUTH ONCE DAILY 30 MINUTES WITH BREAKFAST 90 tablet 3   pregabalin (LYRICA) 150 MG capsule Take 150 mg by mouth 2 (two) times daily.     No current facility-administered medications for this visit.    Allergies as of 11/22/2021 - Review Complete 11/22/2021  Allergen Reaction Noted   Cymbalta [duloxetine hcl] Other (See Comments) 02/22/2012   Daypro [oxaprozin] Other (See Comments) 11/07/2010   Sulfa antibiotics Other (See Comments) 02/22/2012   Trazodone and nefazodone Other (See Comments) 02/22/2012   Nucynta [tapentadol] Other (See Comments) 08/18/2015    Family History  Problem Relation Age of Onset   Cirrhosis Father        etoh, died with liver cancer   Diabetes Father    Cirrhosis Paternal Uncle        multiple, etoh   Pancreatic cancer Paternal Grandmother    Diabetes Paternal Grandmother    Kidney failure Mother    Diabetes Mother    Other Brother        crohn's disease   Hyperlipidemia Brother    Fibromyalgia Daughter    Diabetes Maternal Grandmother    Healthy Son    Colon cancer Maternal Uncle    Colon polyps Neg Hx     Social History   Socioeconomic History   Marital status: Divorced    Spouse name: Not on file   Number of children: 2   Years of education: Not on file   Highest education level: Not on file  Occupational History   Occupation: disability  Tobacco Use   Smoking status: Former    Packs/day: 0.50    Years: 31.00    Pack years: 15.50    Types: E-cigarettes, Cigarettes   Smokeless tobacco: Never   Tobacco comments:    using vape at times  Vaping Use   Vaping Use: Every day  Substance and Sexual Activity   Alcohol use: Yes    Alcohol/week: 0.0 standard drinks    Comment: occasional social use    Drug use: No   Sexual  activity: Not Currently    Birth control/protection: Surgical    Comment: hysterectomy  Other Topics Concern   Not on file  Social History Narrative   Not on file   Social Determinants of Health   Financial Resource Strain: Not on file  Food Insecurity: Not on file  Transportation Needs: Not on file  Physical Activity: Not on file  Stress: Not on file  Social Connections: Not on file  Intimate Partner Violence: Not on file     Review of Systems   Gen: Denies any fever, chills, fatigue, weight loss, lack of appetite.  CV: Denies chest pain, heart palpitations, peripheral edema, syncope.  Resp: Denies shortness of breath  at rest or with exertion. Denies wheezing or cough.  GI: see HPI GU : Denies urinary burning, urinary frequency, urinary hesitancy MS: Denies joint pain, muscle weakness, cramps, or limitation of movement.  Derm: Denies rash, itching, dry skin Psych: Denies depression, anxiety, memory loss, and confusion Heme: Denies bruising, bleeding, and enlarged lymph nodes.   Physical Exam   BP 118/60   Pulse 100   Temp (!) 97.5 F (36.4 C)   Ht '5\' 2"'$  (1.575 m)   Wt 170 lb 9.6 oz (77.4 kg)   BMI 31.20 kg/m  General:   Alert and oriented. Pleasant and cooperative. Well-nourished and well-developed.  Head:  Normocephalic and atraumatic. Eyes:  Without icterus Abdomen:  +BS, soft, TTP RUQ and LUQ and non-distended. No HSM noted. No guarding or rebound. No masses appreciated.  Rectal:  Deferred  Msk:  Symmetrical without gross deformities. Normal posture. Extremities:  Without edema. Neurologic:  Alert and  oriented x4;  grossly normal neurologically. Skin:  Intact without significant lesions or rashes. Psych:  Alert and cooperative. Normal mood and affect.  Lab Results  Component Value Date   WBC 7.0 11/28/2021   HGB 14.1 11/28/2021   HCT 41.4 11/28/2021   MCV 89.4 11/28/2021   PLT 258 11/28/2021     Assessment   Susan Baker is a 61 y.o. female  presenting today in follow-up with a history of chronic abdominal pain, now reporting worsening abdominal pain, globus sensation, and reportedly "spitting up blood".    Abdominal pain: chronic RUQ pain dating back to at least 2012. She has had an EGD in April 2021 that was normal, cholecystectomy thereafter due to biliary dyskinesia. Outside CT chest/abdomen/pelvis Oct 2022 without etiology for right-sided pain. Multiple groundlgass nodules in right lung measuring up to 0.6 cm was seen. Follow-up chest CT recommended. She has a known fatty liver. Associated bloating of abdomen and now with pain in LUQ.   Globus sensation: without overt dysphagia.  Spitting up blood: more consistent with hemoptysis. Difficult to sort out; she does note clearing throat and will have traces of blood. Notes Ibuprofen for headaches. Hgb normal at 14.1.   History of right lung nodules in Oct 2022: will pursue CT chest, especially in light of concern for hemoptysis.      PLAN    Increase pantoprazole to BID, Zofran for nausea CT chest to follow-up on lung nodules With globus sensation and patient's concern for spitting up blood, pursue EGD/dilation with Dr. Gala Romney in near future.    Annitta Needs, PhD, ANP-BC [facility]

## 2021-11-24 ENCOUNTER — Encounter: Payer: Self-pay | Admitting: *Deleted

## 2021-11-24 ENCOUNTER — Telehealth: Payer: Self-pay | Admitting: *Deleted

## 2021-11-24 NOTE — Telephone Encounter (Signed)
Called pt. She has been scheduled for EGD +/-dil with propofol asa 3 on 7/19 at 12:45pm. Aware will mail instructions with pre-op appt.

## 2021-11-28 ENCOUNTER — Emergency Department (HOSPITAL_COMMUNITY)
Admission: EM | Admit: 2021-11-28 | Discharge: 2021-11-28 | Disposition: A | Discharge status: Home / Self Care | Payer: Medicare Other | Attending: Emergency Medicine | Admitting: Emergency Medicine

## 2021-11-28 ENCOUNTER — Other Ambulatory Visit: Payer: Self-pay

## 2021-11-28 ENCOUNTER — Encounter (HOSPITAL_COMMUNITY): Payer: Self-pay | Admitting: Emergency Medicine

## 2021-11-28 ENCOUNTER — Emergency Department (HOSPITAL_COMMUNITY): Payer: Medicare Other

## 2021-11-28 DIAGNOSIS — I10 Essential (primary) hypertension: Secondary | ICD-10-CM | POA: Diagnosis not present

## 2021-11-28 DIAGNOSIS — R079 Chest pain, unspecified: Secondary | ICD-10-CM | POA: Diagnosis present

## 2021-11-28 DIAGNOSIS — Z87891 Personal history of nicotine dependence: Secondary | ICD-10-CM | POA: Diagnosis not present

## 2021-11-28 DIAGNOSIS — R202 Paresthesia of skin: Secondary | ICD-10-CM | POA: Insufficient documentation

## 2021-11-28 LAB — CBC WITH DIFFERENTIAL/PLATELET
Abs Immature Granulocytes: 0.03 10*3/uL (ref 0.00–0.07)
Basophils Absolute: 0 10*3/uL (ref 0.0–0.1)
Basophils Relative: 0 %
Eosinophils Absolute: 0.1 10*3/uL (ref 0.0–0.5)
Eosinophils Relative: 1 %
HCT: 41.4 % (ref 36.0–46.0)
Hemoglobin: 14.1 g/dL (ref 12.0–15.0)
Immature Granulocytes: 0 %
Lymphocytes Relative: 26 %
Lymphs Abs: 1.8 10*3/uL (ref 0.7–4.0)
MCH: 30.5 pg (ref 26.0–34.0)
MCHC: 34.1 g/dL (ref 30.0–36.0)
MCV: 89.4 fL (ref 80.0–100.0)
Monocytes Absolute: 0.8 10*3/uL (ref 0.1–1.0)
Monocytes Relative: 11 %
Neutro Abs: 4.4 10*3/uL (ref 1.7–7.7)
Neutrophils Relative %: 62 %
Platelets: 258 10*3/uL (ref 150–400)
RBC: 4.63 MIL/uL (ref 3.87–5.11)
RDW: 15 % (ref 11.5–15.5)
WBC: 7 10*3/uL (ref 4.0–10.5)
nRBC: 0 % (ref 0.0–0.2)

## 2021-11-28 LAB — COMPREHENSIVE METABOLIC PANEL
ALT: 32 U/L (ref 0–44)
AST: 24 U/L (ref 15–41)
Albumin: 4.3 g/dL (ref 3.5–5.0)
Alkaline Phosphatase: 58 U/L (ref 38–126)
Anion gap: 3 — ABNORMAL LOW (ref 5–15)
BUN: 14 mg/dL (ref 6–20)
CO2: 29 mmol/L (ref 22–32)
Calcium: 9.5 mg/dL (ref 8.9–10.3)
Chloride: 107 mmol/L (ref 98–111)
Creatinine, Ser: 0.88 mg/dL (ref 0.44–1.00)
GFR, Estimated: >60 mL/min (ref >60)
Glucose, Bld: 98 mg/dL (ref 70–99)
Potassium: 4.6 mmol/L (ref 3.5–5.1)
Sodium: 139 mmol/L (ref 135–145)
Total Bilirubin: 0.4 mg/dL (ref 0.3–1.2)
Total Protein: 7.1 g/dL (ref 6.5–8.1)

## 2021-11-28 LAB — D-DIMER, QUANTITATIVE: D-Dimer, Quant: <0.27 ug/mL-FEU (ref 0.00–0.50)

## 2021-11-28 LAB — TROPONIN I (HIGH SENSITIVITY)
Troponin I (High Sensitivity): 2 ng/L (ref <18)
Troponin I (High Sensitivity): 2 ng/L (ref <18)

## 2021-11-28 NOTE — ED Provider Notes (Signed)
[facility] EMERGENCY DEPARTMENT Provider Note   CSN: [id number] Arrival date & time: 11/28/21  5784     History  Chief Complaint  Patient presents with   Chest Pain    Susan Baker is a 61 y.o. female.   Chest Pain  This patient is a 61 year old female, she has a history of acid reflux on Protonix, she is followed by gastroenterology, scheduled to have an endoscopy, followed by her family doctor and recently had a chest x-ray because of some coughing up of a small amount of blood this week.  This x-ray revealed something abnormal in the left lung according to the patient's report, she was placed on doxycycline which she did not tolerate so this was changed to a different antibiotic of which she cannot remember the name.  This does not seem to be helping that much and today she presents with bilateral intermittent chest pain, sometimes the right, sometimes the left, not associated with swelling of the legs or shortness of breath.  No history of pulmonary embolism or cardiac disease however she is scheduled to have a repeat CT scan of her chest tomorrow, this is to follow-up as CT scan of the chest that was done in October at [facility].  Which showed some groundglass opacities and possible nodules in the lung.  The patient is a prior smoker, she stopped 2 years ago.  She reports that she has no exertional symptoms, she has no fevers, she has no diarrhea, she is scheduled to have a stress test in the near future and had an echocardiogram through her doctor in [place] a couple of weeks ago that she was told was totally normal.  She does complain of having some tingling in her left arm, this does not correlate with the chest pain  Home Medications Prior to Admission medications   Medication Sig Start Date End Date Taking? Authorizing Provider  ciprofloxacin (CIPRO) 250 MG tablet Take 250 mg by mouth 2 (two) times daily. 11/22/21  Yes [provider]  FLUoxetine (PROZAC) 40 MG  capsule Take 40 mg by mouth 2 (two) times daily.    Yes [provider]  HYDROcodone-acetaminophen (NORCO/VICODIN) 5-325 MG tablet Take 1 tablet by mouth every 4 (four) hours as needed for moderate pain. Take 1 tablet 4 times daily   Yes [provider]  lisinopril (ZESTRIL) 10 MG tablet Take 10 mg by mouth daily.   Yes [provider]  ondansetron (ZOFRAN-ODT) 4 MG disintegrating tablet Take 1 tablet (4 mg total) by mouth every 8 (eight) hours as needed for nausea or vomiting. 11/22/21  Yes Annitta Needs, NP  pantoprazole (PROTONIX) 40 MG tablet Take 1 tablet (40 mg total) by mouth 2 (two) times daily before a meal. 11/24/21  Yes Annitta Needs, NP  pregabalin (LYRICA) 150 MG capsule Take 150 mg by mouth 2 (two) times daily. 03/19/19  Yes [provider]  VENTOLIN HFA 108 (90 Base) MCG/ACT inhaler Inhale 1 puff into the lungs every 4 (four) hours as needed for shortness of breath. 11/10/21  Yes [provider]  doxycycline (VIBRAMYCIN) 100 MG capsule Take 100 mg by mouth 2 (two) times daily. Patient not taking: Reported on 11/28/2021    [provider]  meloxicam (MOBIC) 7.5 MG tablet Take 7.5 mg by mouth daily. Patient not taking: Reported on 11/28/2021    [provider]      Allergies    Cymbalta [duloxetine hcl], Daypro [oxaprozin], Sulfa antibiotics, Trazodone  and nefazodone, and Nucynta [tapentadol]    Review of Systems   Review of Systems  Cardiovascular:  Positive for chest pain.  All other systems reviewed and are negative.  Physical Exam Updated Vital Signs BP 132/76   Pulse 73   Temp 97.7 F (36.5 C) (Oral)   Resp 13   Ht 1.588 m (5' 2.5")   Wt 72.6 kg   SpO2 94%   BMI 28.80 kg/m  Physical Exam Vitals and nursing note reviewed.  Constitutional:      General: She is not in acute distress.    Appearance: She is well-developed.  HENT:     Head: Normocephalic and atraumatic.     Mouth/Throat:     Pharynx: No  oropharyngeal exudate.  Eyes:     General: No scleral icterus.       Right eye: No discharge.        Left eye: No discharge.     Conjunctiva/sclera: Conjunctivae normal.     Pupils: Pupils are equal, round, and reactive to light.  Neck:     Thyroid: No thyromegaly.     Vascular: No JVD.  Cardiovascular:     Rate and Rhythm: Normal rate and regular rhythm.     Heart sounds: Normal heart sounds. No murmur heard.   No friction rub. No gallop.  Pulmonary:     Effort: Pulmonary effort is normal. No tachypnea or respiratory distress.     Breath sounds: Normal breath sounds. No wheezing or rales.  Abdominal:     General: Bowel sounds are normal. There is no distension.     Palpations: Abdomen is soft. There is no mass.     Tenderness: There is no abdominal tenderness.  Musculoskeletal:        General: No tenderness. Normal range of motion.     Cervical back: Normal range of motion and neck supple.     Right lower leg: No edema.     Left lower leg: No edema.  Lymphadenopathy:     Cervical: No cervical adenopathy.  Skin:    General: Skin is warm and dry.     Findings: No erythema or rash.  Neurological:     Mental Status: She is alert.     Coordination: Coordination normal.  Psychiatric:        Behavior: Behavior normal.    ED Results / Procedures / Treatments   Labs (all labs ordered are listed, but only abnormal results are displayed) Labs Reviewed  COMPREHENSIVE METABOLIC PANEL - Abnormal; Notable for the following components:      Result Value   Anion gap 3 (*)    All other components within normal limits  CBC WITH DIFFERENTIAL/PLATELET  D-DIMER, QUANTITATIVE  TROPONIN I (HIGH SENSITIVITY)  TROPONIN I (HIGH SENSITIVITY)    EKG EKG Interpretation  Date/Time:  Monday Nov 28 2021 09:47:20 EDT Ventricular Rate:  85 PR Interval:  131 QRS Duration: 80 QT Interval:  365 QTC Calculation: 434 R Axis:   36 Text Interpretation: Sinus rhythm Abnormal R-wave progression,  early transition Confirmed by Noemi Chapel [id number]) on 11/28/2021 10:04:08 AM  Radiology DG Chest 2 View  Result Date: 11/28/2021 CLINICAL DATA:  cough, blood, recent abnormal xray EXAM: CHEST - 2 VIEW COMPARISON:  May 17, 22. FINDINGS: Similar subtle lateral left basilar opacity. No visible pleural effusions or pneumothorax. Cardiomediastinal silhouette is within normal limits. No acute osseous abnormality. IMPRESSION: Similar subtle lateral left basilar opacity. As mentioned on the prior, this could potentially  relate to confluence of shadows, atelectasis, or pneumonia. Given the patient's reported hemoptysis and persistence, recommend follow-up chest CT to exclude nodule/malignancy. Electronically Signed   By: Margaretha Sheffield M.D.   On: 11/28/2021 10:36    Procedures Procedures    Medications Ordered in ED Medications - No data to display  ED Course/ Medical Decision Making/ A&P                           Medical Decision Making Amount and/or Complexity of Data Reviewed Labs: ordered. Radiology: ordered.   This patient presents to the ED for concern of chest discomfort, coughing up some blood here and there, reports tachycardia and hypertension this morning though she is neither tachycardic nor significantly hypertensive at this time., this involves an extensive number of treatment options, and is a complaint that carries with it a high risk of complications and morbidity.  The differential diagnosis includes pulmonary embolism, coronary syndrome, pneumothorax, this could be related to bronchitis or even acid reflux   Co morbidities that complicate the patient evaluation  Hypertension, acid reflux, history of some cervical radiculopathy   Additional history obtained:  Additional history obtained from electronic medical record External records from outside source obtained and reviewed including CT scan performed at [facility] in October, x-ray performed by family doctor  approximately 10 days ago which showed possible early infiltrate in the lateral left lung base   Lab Tests:  I Ordered, and personally interpreted labs.  The pertinent results include:  normal trop, normal CBC and metabolic panel   Imaging Studies ordered:  I ordered imaging studies including portable chest x-ray I independently visualized and interpreted imaging which showed unchanged from prior, vague haziness, needs CT scan which has already been ordered for tomorrow I agree with the radiologist interpretation   Cardiac Monitoring: / EKG:  The patient was maintained on a cardiac monitor.  I personally viewed and interpreted the cardiac monitored which showed an underlying rhythm of: Normal sinus rhythm, no tachycardia, maintained mild hypertension   Consultations Obtained:  No consultations needed  Problem List / ED Course / Critical interventions / Medication management  Patient looks well, symptoms are mild, vital signs are normal, did not require any significant stabilizing care Reevaluation of the patient after these medicines showed that the patient improved I have reviewed the patients home medicines and have made adjustments as needed   Social Determinants of Health:  None   Test / Admission - Considered:  No admission needed, considered CT scan but it is already ordered for tomorrow the patient is hemodynamically stable, stay for discharge   I have discussed with the patient at the bedside the results, and the meaning of these results.  They have expressed her understanding to the need for follow-up with primary care physician         Final Clinical Impression(s) / ED Diagnoses Final diagnoses:  Chest pain, unspecified type  Essential hypertension    Rx / DC Orders ED Discharge Orders     None         Noemi Chapel, MD 11/28/21 1431

## 2021-11-28 NOTE — ED Triage Notes (Signed)
Patient c/o left side chest pain with some radiation into right chest and into left neck and arm. Per patient shortness of breath, nausea, lightheadedness, and weakness. Denies any vomiting. Denies any cardiac hx. Per patient recently being treated with prednisone and antibiotics for possible left lung infection blood in sputum. Per patient blood has decreased significantly in sputum. Patient also has CT ordered for tomorrow for nodules in lungs.

## 2021-11-28 NOTE — Discharge Instructions (Signed)
The testing today showed that your x-ray is really unchanged and so the CAT scan tomorrow will be necessary, please make sure that you continue with this, get it done and follow-up with your family doctor.  If you should develop severe or worsening symptoms return to the emergency department immediately otherwise continue the medications including the antibiotic that you are prescribed.

## 2021-11-28 NOTE — ED Triage Notes (Signed)
Patient also reports progressive increase in blood pressure in past week. With highest B/P of 154/118.

## 2021-11-29 ENCOUNTER — Ambulatory Visit (HOSPITAL_COMMUNITY)
Admission: RE | Admit: 2021-11-29 | Discharge: 2021-11-29 | Disposition: A | Discharge status: Home / Self Care | Payer: Medicare Other | Source: Ambulatory Visit | Attending: Gastroenterology | Admitting: Gastroenterology

## 2021-11-29 ENCOUNTER — Encounter (HOSPITAL_COMMUNITY): Payer: Self-pay | Admitting: Radiology

## 2021-11-29 DIAGNOSIS — R918 Other nonspecific abnormal finding of lung field: Secondary | ICD-10-CM | POA: Insufficient documentation

## 2021-11-29 MED ORDER — IOHEXOL 300 MG/ML  SOLN
100.0000 mL | Freq: Once | INTRAMUSCULAR | Status: AC | PRN
  Administered 2021-11-29: 75 mL via INTRAVENOUS

## 2021-11-30 ENCOUNTER — Telehealth: Payer: Self-pay

## 2021-11-30 NOTE — Telephone Encounter (Signed)
Returned the pt's call from vm. No answer LMOVM regarding Susan Baker not in office today and the message would be given to her. Also that the Dr's have at least 5 to 7 days to get the results to pt.

## 2022-01-09 ENCOUNTER — Other Ambulatory Visit: Payer: Self-pay | Admitting: *Deleted

## 2022-01-09 DIAGNOSIS — R911 Solitary pulmonary nodule: Secondary | ICD-10-CM

## 2022-01-12 ENCOUNTER — Encounter: Payer: Self-pay | Admitting: Emergency Medicine

## 2022-01-12 ENCOUNTER — Ambulatory Visit (INDEPENDENT_AMBULATORY_CARE_PROVIDER_SITE_OTHER): Payer: Medicare Other | Admitting: Emergency Medicine

## 2022-01-12 VITALS — BP 124/72 | HR 79 | Temp 97.9°F | Ht 62.5 in | Wt 173.4 lb

## 2022-01-12 DIAGNOSIS — R042 Hemoptysis: Secondary | ICD-10-CM

## 2022-01-12 DIAGNOSIS — R9389 Abnormal findings on diagnostic imaging of other specified body structures: Secondary | ICD-10-CM

## 2022-01-12 NOTE — Patient Instructions (Addendum)
We reviewed your CT scan of the chest today. Agree with repeat CT chest in August as planned.  We will review that scan together once available. We will work on scheduling bronchoscopy to evaluate your airways and obtain cultures.  This will be done under anesthesia as an outpatient at [facility] endoscopy.  We will try to get this scheduled for 01/26/2022.  You will need a designated driver. Follow with Dr Lamonte Sakai in 1 month or next available

## 2022-01-12 NOTE — Assessment & Plan Note (Signed)
Unclear cause.  No clear etiology noted on CT chest.  Consider occult epistaxis.  Consider GI source as she does have a history of PUD.  She is supposed to have an endoscopy coming up soon.  I think she needs bronchoscopy and airway inspection and I will try to get this arranged for 7/27

## 2022-01-12 NOTE — Progress Notes (Signed)
Subjective:    Patient ID: Susan Baker, female    DOB: 07-31-1960, 61 y.o.   MRN: [id number]  HPI 61 year old former smoker (30 pack years, still vapes) with a history of fibromyalgia, GERD with PUD, DDD, depression/anxiety, uterine cancer that was treated with hysterectomy in She is referred today for evaluation of an abnormal CT scan of the chest. She had been having cough and hemoptysis with some associated left anterior chest discomfort. Began in April, can come and go, a dull ache but can be sharp. Can be worse based on position. She saw some blood in mucous in April, was treated w abx. No nasal blood, dental blood. Happening qod currently. No clots or frank blood. She has a hx of PUD and GIB's - she is unsure whether the blood is coming from chest or stomach, but seems to be her chest. She has sore throat.   She describes.  Prompted CT chest, CT chest 11/29/2021 at [facility] as below.  CT chest 11/29/2021 reviewed by me, shows no mediastinal or hilar adenopathy, lateral right middle lobe subpleural reticulation with some adjacent groundglass without any discrete nodularity.  There is some bandlike subsegmental atelectasis in the anterior right upper and middle lobes.  There is also a solid 3 mm right middle lobe pulmonary nodule  CT abd/pelvis DUMB 04/14/21 report >> multiple GG nodules in R lung, largest 98m   Review of Systems As per HPI  Past Medical History:  Diagnosis Date   Abnormal Pap smear    Abnormal ThinPrep Pap test of vagina 04/25/2017   LSIL will get colp__________   Anxiety    Bulging disc    cervical, [facility]   Depression 02/18/2013   Fibromyalgia    GERD (gastroesophageal reflux disease)    egd with RE per patient, remote   Hematuria 05/14/2014   History of abnormal cervical Pap smear 02/24/2014   Osteoarthritis (arthritis due to wear and tear of joints)    Osteoporosis    Osteoporosis, unspecified 03/03/2014   Postcoital bleeding 09/15/2015   Postmenopausal  02/24/2014   PUD (peptic ulcer disease)    bleeding PUD per patient, remote, On Daypro   Urinary frequency 05/14/2014   Uterine cancer (HCC)    Vaginal Pap smear, abnormal      Family History  Problem Relation Age of Onset   Cirrhosis Father        etoh, died with liver cancer   Diabetes Father    Cirrhosis Paternal Uncle        multiple, etoh   Pancreatic cancer Paternal Grandmother    Diabetes Paternal Grandmother    Kidney failure Mother    Diabetes Mother    Other Brother        crohn's disease   Hyperlipidemia Brother    Fibromyalgia Daughter    Diabetes Maternal Grandmother    Healthy Son    Colon cancer Maternal Uncle    Colon polyps Neg Hx      Social History   Socioeconomic History   Marital status: Divorced    Spouse name: Not on file   Number of children: 2   Years of education: Not on file   Highest education level: Not on file  Occupational History   Occupation: disability  Tobacco Use   Smoking status: Former    Packs/day: 0.50    Years: 31.00    Total pack years: 15.50    Types: E-cigarettes, Cigarettes   Smokeless tobacco: Never   Tobacco  comments:    using vape at times  Vaping Use   Vaping Use: Some days   Substances: Nicotine-salt  Substance and Sexual Activity   Alcohol use: Yes    Alcohol/week: 0.0 standard drinks of alcohol    Comment: occasional social use    Drug use: No   Sexual activity: Not Currently    Birth control/protection: Surgical    Comment: hysterectomy  Other Topics Concern   Not on file  Social History Narrative   Not on file   Social Determinants of Health   Financial Resource Strain: Not on file  Food Insecurity: No Food Insecurity (12/22/2019)   Hunger Vital Sign    Worried About Running Out of Food in the Last Year: Never true    Ran Out of Food in the Last Year: Never true  Transportation Needs: No Transportation Needs (12/22/2019)   PRAPARE - Hydrologist (Medical): No     Lack of Transportation (Non-Medical): No  Physical Activity: Sufficiently Active (12/22/2019)   Exercise Vital Sign    Days of Exercise per Week: 5 days    Minutes of Exercise per Session: 30 min  Stress: Stress Concern Present (12/22/2019)   [place]    Feeling of Stress : Rather much  Social Connections: Moderately Isolated (12/22/2019)   Social Connection and Isolation Panel [NHANES]    Frequency of Communication with Friends and Family: More than three times a week    Frequency of Social Gatherings with Friends and Family: Twice a week    Attends Religious Services: More than 4 times per year    Active Member of [employer] or Organizations: No    Attends Archivist Meetings: Never    Marital Status: Divorced  Human resources officer Violence: Not At Risk (12/22/2019)   Humiliation, Afraid, Rape, and Kick questionnaire    Fear of Current or Ex-Partner: No    Emotionally Abused: No    Physically Abused: No    Sexually Abused: No     Allergies  Allergen Reactions   Cymbalta [Duloxetine Hcl] Other (See Comments)    Depression, anxiety, wt gain   Daypro [Oxaprozin] Other (See Comments)    Ulcers   Sulfa Antibiotics Other (See Comments)    Bactrim-Could not function   Trazodone And Nefazodone Other (See Comments)    Could not function   Nucynta [Tapentadol] Other (See Comments)    Memory loss and slurred speech     Outpatient Medications Prior to Visit  Medication Sig Dispense Refill   FLUoxetine (PROZAC) 40 MG capsule Take 40 mg by mouth daily.     HYDROcodone-acetaminophen (NORCO/VICODIN) 5-325 MG tablet Take 1 tablet by mouth every 4 (four) hours as needed for moderate pain. Take 1 tablet 4 times daily     lisinopril (ZESTRIL) 10 MG tablet Take 10 mg by mouth daily.     methocarbamol (ROBAXIN) 500 MG tablet Take 500 mg by mouth every 8 (eight) hours as needed for muscle spasms.     ondansetron (ZOFRAN-ODT) 4 MG  disintegrating tablet Take 1 tablet (4 mg total) by mouth every 8 (eight) hours as needed for nausea or vomiting. 30 tablet 1   pantoprazole (PROTONIX) 40 MG tablet Take 1 tablet (40 mg total) by mouth 2 (two) times daily before a meal. 60 tablet 3   pregabalin (LYRICA) 150 MG capsule Take 150 mg by mouth 2 (two) times daily.     Tiotropium  Bromide Monohydrate 1.25 MCG/ACT AERS Inhale 1.25 mcg into the lungs daily as needed (Breathing issues).     VENTOLIN HFA 108 (90 Base) MCG/ACT inhaler Inhale 1 puff into the lungs every 4 (four) hours as needed for shortness of breath.     No facility-administered medications prior to visit.         Objective:   Physical Exam Vitals:   01/12/22 1351  BP: 124/72  Pulse: 79  Temp: 97.9 F (36.6 C)  TempSrc: Oral  SpO2: 98%  Weight: 173 lb 6.4 oz (78.7 kg)  Height: 5' 2.5" (1.588 m)   Gen: Pleasant, well-nourished, in no distress,  normal affect  ENT: No lesions,  mouth clear,  oropharynx clear, no postnasal drip  Neck: No JVD, no stridor  Lungs: No use of accessory muscles, no crackles or wheezing on normal respiration, no wheeze on forced expiration  Cardiovascular: RRR, heart sounds normal, no murmur or gallops, no peripheral edema  Musculoskeletal: No deformities, no cyanosis or clubbing  Neuro: alert, awake, non focal  Skin: Warm, no lesions or rash      Assessment & Plan:  Abnormal CT of the chest Groundglass nodular peripheral infiltrates noted on CT chest particularly right middle lobe, sound like similar findings were on her CT abdomen that was done at [facility] in October 2022.  Unclear whether this would be related to hemoptysis but will need to be followed.  Next scan planned for August.  If she is going to have bronchoscopy to evaluate hemoptysis then we will perform right middle lobe BAL for culture data.  Depending on how nodules evolve on CT other work-up may be needed including possible repeat bronchoscopy with  biopsies.  Hemoptysis Unclear cause.  No clear etiology noted on CT chest.  Consider occult epistaxis.  Consider GI source as she does have a history of PUD.  She is supposed to have an endoscopy coming up soon.  I think she needs bronchoscopy and airway inspection and I will try to get this arranged for 7/27   Baltazar Apo, MD, PhD 01/12/2022, 2:43 PM [facility] [phone] or if no answer before 7:00PM call [phone] For any issues after 7:00PM please call eLink [phone]

## 2022-01-12 NOTE — H&P (View-Only) (Signed)
 Subjective:    Patient ID: Susan Baker, female    DOB: 05/21/1961, 61 y.o.   MRN: [id number]  HPI 61-year-old former smoker (30 pack years, still vapes) with a history of fibromyalgia, GERD with PUD, DDD, depression/anxiety, uterine cancer that was treated with hysterectomy in She is referred today for evaluation of an abnormal CT scan of the chest. She had been having cough and hemoptysis with some associated left anterior chest discomfort. Began in April, can come and go, a dull ache but can be sharp. Can be worse based on position. She saw some blood in mucous in April, was treated w abx. No nasal blood, dental blood. Happening qod currently. No clots or frank blood. She has a hx of PUD and GIB's - she is unsure whether the blood is coming from chest or stomach, but seems to be her chest. She has sore throat.   She describes.  Prompted CT chest, CT chest 11/29/2021 at [facility] as below.  CT chest 11/29/2021 reviewed by me, shows no mediastinal or hilar adenopathy, lateral right middle lobe subpleural reticulation with some adjacent groundglass without any discrete nodularity.  There is some bandlike subsegmental atelectasis in the anterior right upper and middle lobes.  There is also a solid 3 mm right middle lobe pulmonary nodule  CT abd/pelvis DUMB 04/14/21 report >> multiple GG nodules in R lung, largest 6mm   Review of Systems As per HPI  Past Medical History:  Diagnosis Date   Abnormal Pap smear    Abnormal ThinPrep Pap test of vagina 04/25/2017   LSIL will get colp__________   Anxiety    Bulging disc    cervical, [facility]   Depression 02/18/2013   Fibromyalgia    GERD (gastroesophageal reflux disease)    egd with RE per patient, remote   Hematuria 05/14/2014   History of abnormal cervical Pap smear 02/24/2014   Osteoarthritis (arthritis due to wear and tear of joints)    Osteoporosis    Osteoporosis, unspecified 03/03/2014   Postcoital bleeding 09/15/2015   Postmenopausal  02/24/2014   PUD (peptic ulcer disease)    bleeding PUD per patient, remote, On Daypro   Urinary frequency 05/14/2014   Uterine cancer (HCC)    Vaginal Pap smear, abnormal      Family History  Problem Relation Age of Onset   Cirrhosis Father        etoh, died with liver cancer   Diabetes Father    Cirrhosis Paternal Uncle        multiple, etoh   Pancreatic cancer Paternal Grandmother    Diabetes Paternal Grandmother    Kidney failure Mother    Diabetes Mother    Other Brother        crohn's disease   Hyperlipidemia Brother    Fibromyalgia Daughter    Diabetes Maternal Grandmother    Healthy Son    Colon cancer Maternal Uncle    Colon polyps Neg Hx      Social History   Socioeconomic History   Marital status: Divorced    Spouse name: Not on file   Number of children: 2   Years of education: Not on file   Highest education level: Not on file  Occupational History   Occupation: disability  Tobacco Use   Smoking status: Former    Packs/day: 0.50    Years: 31.00    Total pack years: 15.50    Types: E-cigarettes, Cigarettes   Smokeless tobacco: Never   Tobacco   comments:    using vape at times  Vaping Use   Vaping Use: Some days   Substances: Nicotine-salt  Substance and Sexual Activity   Alcohol use: Yes    Alcohol/week: 0.0 standard drinks of alcohol    Comment: occasional social use    Drug use: No   Sexual activity: Not Currently    Birth control/protection: Surgical    Comment: hysterectomy  Other Topics Concern   Not on file  Social History Narrative   Not on file   Social Determinants of Health   Financial Resource Strain: Not on file  Food Insecurity: No Food Insecurity (12/22/2019)   Hunger Vital Sign    Worried About Running Out of Food in the Last Year: Never true    Ran Out of Food in the Last Year: Never true  Transportation Needs: No Transportation Needs (12/22/2019)   PRAPARE - Transportation    Lack of Transportation (Medical): No     Lack of Transportation (Non-Medical): No  Physical Activity: Sufficiently Active (12/22/2019)   Exercise Vital Sign    Days of Exercise per Week: 5 days    Minutes of Exercise per Session: 30 min  Stress: Stress Concern Present (12/22/2019)   [employer] - Occupational Stress Questionnaire    Feeling of Stress : Rather much  Social Connections: Moderately Isolated (12/22/2019)   Social Connection and Isolation Panel [NHANES]    Frequency of Communication with Friends and Family: More than three times a week    Frequency of Social Gatherings with Friends and Family: Twice a week    Attends Religious Services: More than 4 times per year    Active Member of Clubs or Organizations: No    Attends Club or Organization Meetings: Never    Marital Status: Divorced  Intimate Partner Violence: Not At Risk (12/22/2019)   Humiliation, Afraid, Rape, and Kick questionnaire    Fear of Current or Ex-Partner: No    Emotionally Abused: No    Physically Abused: No    Sexually Abused: No     Allergies  Allergen Reactions   Cymbalta [Duloxetine Hcl] Other (See Comments)    Depression, anxiety, wt gain   Daypro [Oxaprozin] Other (See Comments)    Ulcers   Sulfa Antibiotics Other (See Comments)    Bactrim-Could not function   Trazodone And Nefazodone Other (See Comments)    Could not function   Nucynta [Tapentadol] Other (See Comments)    Memory loss and slurred speech     Outpatient Medications Prior to Visit  Medication Sig Dispense Refill   FLUoxetine (PROZAC) 40 MG capsule Take 40 mg by mouth daily.     HYDROcodone-acetaminophen (NORCO/VICODIN) 5-325 MG tablet Take 1 tablet by mouth every 4 (four) hours as needed for moderate pain. Take 1 tablet 4 times daily     lisinopril (ZESTRIL) 10 MG tablet Take 10 mg by mouth daily.     methocarbamol (ROBAXIN) 500 MG tablet Take 500 mg by mouth every 8 (eight) hours as needed for muscle spasms.     ondansetron (ZOFRAN-ODT) 4 MG  disintegrating tablet Take 1 tablet (4 mg total) by mouth every 8 (eight) hours as needed for nausea or vomiting. 30 tablet 1   pantoprazole (PROTONIX) 40 MG tablet Take 1 tablet (40 mg total) by mouth 2 (two) times daily before a meal. 60 tablet 3   pregabalin (LYRICA) 150 MG capsule Take 150 mg by mouth 2 (two) times daily.     Tiotropium   Bromide Monohydrate 1.25 MCG/ACT AERS Inhale 1.25 mcg into the lungs daily as needed (Breathing issues).     VENTOLIN HFA 108 (90 Base) MCG/ACT inhaler Inhale 1 puff into the lungs every 4 (four) hours as needed for shortness of breath.     No facility-administered medications prior to visit.         Objective:   Physical Exam Vitals:   01/12/22 1351  BP: 124/72  Pulse: 79  Temp: 97.9 F (36.6 C)  TempSrc: Oral  SpO2: 98%  Weight: 173 lb 6.4 oz (78.7 kg)  Height: 5' 2.5" (1.588 m)   Gen: Pleasant, well-nourished, in no distress,  normal affect  ENT: No lesions,  mouth clear,  oropharynx clear, no postnasal drip  Neck: No JVD, no stridor  Lungs: No use of accessory muscles, no crackles or wheezing on normal respiration, no wheeze on forced expiration  Cardiovascular: RRR, heart sounds normal, no murmur or gallops, no peripheral edema  Musculoskeletal: No deformities, no cyanosis or clubbing  Neuro: alert, awake, non focal  Skin: Warm, no lesions or rash      Assessment & Plan:  Abnormal CT of the chest Groundglass nodular peripheral infiltrates noted on CT chest particularly right middle lobe, sound like similar findings were on her CT abdomen that was done at [facility] in October 2022.  Unclear whether this would be related to hemoptysis but will need to be followed.  Next scan planned for August.  If she is going to have bronchoscopy to evaluate hemoptysis then we will perform right middle lobe BAL for culture data.  Depending on how nodules evolve on CT other work-up may be needed including possible repeat bronchoscopy with  biopsies.  Hemoptysis Unclear cause.  No clear etiology noted on CT chest.  Consider occult epistaxis.  Consider GI source as she does have a history of PUD.  She is supposed to have an endoscopy coming up soon.  I think she needs bronchoscopy and airway inspection and I will try to get this arranged for 7/27   Ramzi Brathwaite, MD, PhD 01/12/2022, 2:43 PM [facility] [phone] or if no answer before 7:00PM call [phone] For any issues after 7:00PM please call eLink [phone]   

## 2022-01-12 NOTE — Assessment & Plan Note (Signed)
Groundglass nodular peripheral infiltrates noted on CT chest particularly right middle lobe, sound like similar findings were on her CT abdomen that was done at [facility] in October 2022.  Unclear whether this would be related to hemoptysis but will need to be followed.  Next scan planned for August.  If she is going to have bronchoscopy to evaluate hemoptysis then we will perform right middle lobe BAL for culture data.  Depending on how nodules evolve on CT other work-up may be needed including possible repeat bronchoscopy with biopsies.

## 2022-01-12 NOTE — Addendum Note (Signed)
Addended by: Valerie Salts on: 01/12/2022 04:54 PM   Modules accepted: Orders

## 2022-01-13 NOTE — Patient Instructions (Addendum)
SHARRYN BELDING  01/13/2022     '@PREFPERIOPPHARMACY'$ @   Your procedure is scheduled on  01/18/2022.   Report to [facility] at  1100  A.M.   Call this number if you have problems the morning of surgery:  [phone]   Remember:  Follow the diet and prep instructions given to you by the office.    Use your inhaler before you come and bring your rescue inhaler with you.    Take these medicines the morning of surgery with A SIP OF WATER       prozac, norco(if needed), robaxin (if needed), zofran (if needed), protonix, lyrica.     Do not wear jewelry, make-up or nail polish.  Do not wear lotions, powders, or perfumes, or deodorant.  Do not shave 48 hours prior to surgery.  Men may shave face and neck.  Do not bring valuables to the hospital.  [facility] is not responsible for any belongings or valuables.  Contacts, dentures or bridgework may not be worn into surgery.  Leave your suitcase in the car.  After surgery it may be brought to your room.  For patients admitted to the hospital, discharge time will be determined by your treatment team.  Patients discharged the day of surgery will not be allowed to drive home and must have someone with them for 24 hours.    Special instructions:   DO NOT smoke tobacco or vape for 24 hours before your procedure.  Please read over the following fact sheets that you were given. Anesthesia Post-op Instructions and Care and Recovery After Surgery      Upper Endoscopy, Adult, Care After This sheet gives you information about how to care for yourself after your procedure. Your health care provider may also give you more specific instructions. If you have problems or questions, contact your health care provider. What can I expect after the procedure? After the procedure, it is common to have: A sore throat. Mild stomach pain or discomfort. Bloating. Nausea. Follow these instructions at home:  Follow instructions from your health  care provider about what to eat or drink after your procedure. Return to your normal activities as told by your health care provider. Ask your health care provider what activities are safe for you. Take over-the-counter and prescription medicines only as told by your health care provider. If you were given a sedative during the procedure, it can affect you for several hours. Do not drive or operate machinery until your health care provider says that it is safe. Keep all follow-up visits as told by your health care provider. This is important. Contact a health care provider if you have: A sore throat that lasts longer than one day. Trouble swallowing. Get help right away if: You vomit blood or your vomit looks like coffee grounds. You have: A fever. Bloody, black, or tarry stools. A severe sore throat or you cannot swallow. Difficulty breathing. Severe pain in your chest or abdomen. Summary After the procedure, it is common to have a sore throat, mild stomach discomfort, bloating, and nausea. If you were given a sedative during the procedure, it can affect you for several hours. Do not drive or operate machinery until your health care provider says that it is safe. Follow instructions from your health care provider about what to eat or drink after your procedure. Return to your normal activities as told by your health care provider. This information is not intended to replace  advice given to you by your health care provider. Make sure you discuss any questions you have with your health care provider. Document Revised: 04/25/2019 Document Reviewed: 11/19/2017 Elsevier Patient Education  [place] After This sheet gives you information about how to care for yourself after your procedure. Your health care provider may also give you more specific instructions. If you have problems or questions, contact your health care provider. What can I expect after  the procedure? After the procedure, it is common to have: Tiredness. Forgetfulness about what happened after the procedure. Impaired judgment for important decisions. Nausea or vomiting. Some difficulty with balance. Follow these instructions at home: For the time period you were told by your health care provider:     Rest as needed. Do not participate in activities where you could fall or become injured. Do not drive or use machinery. Do not drink alcohol. Do not take sleeping pills or medicines that cause drowsiness. Do not make important decisions or sign legal documents. Do not take care of children on your own. Eating and drinking Follow the diet that is recommended by your health care provider. Drink enough fluid to keep your urine pale yellow. If you vomit: Drink water, juice, or soup when you can drink without vomiting. Make sure you have little or no nausea before eating solid foods. General instructions Have a responsible adult stay with you for the time you are told. It is important to have someone help care for you until you are awake and alert. Take over-the-counter and prescription medicines only as told by your health care provider. If you have sleep apnea, surgery and certain medicines can increase your risk for breathing problems. Follow instructions from your health care provider about wearing your sleep device: Anytime you are sleeping, including during daytime naps. While taking prescription pain medicines, sleeping medicines, or medicines that make you drowsy. Avoid smoking. Keep all follow-up visits as told by your health care provider. This is important. Contact a health care provider if: You keep feeling nauseous or you keep vomiting. You feel light-headed. You are still sleepy or having trouble with balance after 24 hours. You develop a rash. You have a fever. You have redness or swelling around the IV site. Get help right away if: You have trouble  breathing. You have new-onset confusion at home. Summary For several hours after your procedure, you may feel tired. You may also be forgetful and have poor judgment. Have a responsible adult stay with you for the time you are told. It is important to have someone help care for you until you are awake and alert. Rest as told. Do not drive or operate machinery. Do not drink alcohol or take sleeping pills. Get help right away if you have trouble breathing, or if you suddenly become confused. This information is not intended to replace advice given to you by your health care provider. Make sure you discuss any questions you have with your health care provider. Document Revised: 05/24/2021 Document Reviewed: 05/22/2019 Elsevier Patient Education  [place].

## 2022-01-16 ENCOUNTER — Encounter (HOSPITAL_COMMUNITY): Payer: Self-pay

## 2022-01-16 ENCOUNTER — Encounter (HOSPITAL_COMMUNITY)
Admission: RE | Admit: 2022-01-16 | Discharge: 2022-01-16 | Disposition: A | Discharge status: Home / Self Care | Payer: Medicare Other | Source: Ambulatory Visit | Attending: Internal Medicine | Admitting: Internal Medicine

## 2022-01-16 HISTORY — DX: Essential (primary) hypertension: I10

## 2022-01-16 HISTORY — DX: Chronic obstructive pulmonary disease, unspecified: J44.9

## 2022-01-18 ENCOUNTER — Other Ambulatory Visit: Payer: Self-pay

## 2022-01-18 ENCOUNTER — Ambulatory Visit (HOSPITAL_BASED_OUTPATIENT_CLINIC_OR_DEPARTMENT_OTHER): Payer: Medicare Other | Admitting: Anesthesiology

## 2022-01-18 ENCOUNTER — Ambulatory Visit (HOSPITAL_COMMUNITY): Payer: Medicare Other | Admitting: Anesthesiology

## 2022-01-18 ENCOUNTER — Encounter (HOSPITAL_COMMUNITY): Payer: Self-pay | Admitting: Internal Medicine

## 2022-01-18 ENCOUNTER — Encounter (HOSPITAL_COMMUNITY)
Admission: RE | Disposition: A | Discharge status: Home / Self Care | Payer: Self-pay | Source: Home / Self Care | Attending: Internal Medicine

## 2022-01-18 ENCOUNTER — Ambulatory Visit (HOSPITAL_COMMUNITY)
Admission: RE | Admit: 2022-01-18 | Discharge: 2022-01-18 | Disposition: A | Discharge status: Home / Self Care | Payer: Medicare Other | Attending: Internal Medicine | Admitting: Internal Medicine

## 2022-01-18 DIAGNOSIS — K449 Diaphragmatic hernia without obstruction or gangrene: Secondary | ICD-10-CM | POA: Insufficient documentation

## 2022-01-18 DIAGNOSIS — J449 Chronic obstructive pulmonary disease, unspecified: Secondary | ICD-10-CM | POA: Insufficient documentation

## 2022-01-18 DIAGNOSIS — Z87891 Personal history of nicotine dependence: Secondary | ICD-10-CM | POA: Diagnosis not present

## 2022-01-18 DIAGNOSIS — Z8542 Personal history of malignant neoplasm of other parts of uterus: Secondary | ICD-10-CM | POA: Diagnosis not present

## 2022-01-18 DIAGNOSIS — K319 Disease of stomach and duodenum, unspecified: Secondary | ICD-10-CM | POA: Diagnosis not present

## 2022-01-18 DIAGNOSIS — R1013 Epigastric pain: Secondary | ICD-10-CM | POA: Diagnosis not present

## 2022-01-18 DIAGNOSIS — K259 Gastric ulcer, unspecified as acute or chronic, without hemorrhage or perforation: Secondary | ICD-10-CM

## 2022-01-18 DIAGNOSIS — I10 Essential (primary) hypertension: Secondary | ICD-10-CM | POA: Diagnosis not present

## 2022-01-18 DIAGNOSIS — Z8711 Personal history of peptic ulcer disease: Secondary | ICD-10-CM | POA: Diagnosis not present

## 2022-01-18 DIAGNOSIS — K219 Gastro-esophageal reflux disease without esophagitis: Secondary | ICD-10-CM | POA: Insufficient documentation

## 2022-01-18 DIAGNOSIS — R042 Hemoptysis: Secondary | ICD-10-CM | POA: Insufficient documentation

## 2022-01-18 HISTORY — PX: ESOPHAGOGASTRODUODENOSCOPY (EGD) WITH PROPOFOL: SHX5813

## 2022-01-18 HISTORY — PX: BIOPSY: SHX5522

## 2022-01-18 SURGERY — ESOPHAGOGASTRODUODENOSCOPY (EGD) WITH PROPOFOL
Anesthesia: General

## 2022-01-18 MED ORDER — PROPOFOL 10 MG/ML IV BOLUS
INTRAVENOUS | Status: DC | PRN
  Administered 2022-01-18: 80 mg via INTRAVENOUS

## 2022-01-18 MED ORDER — PROPOFOL 500 MG/50ML IV EMUL
INTRAVENOUS | Status: AC
  Filled 2022-01-18: qty 50

## 2022-01-18 MED ORDER — LACTATED RINGERS IV SOLN
INTRAVENOUS | Status: DC
Start: 2022-01-18 — End: 2022-01-18

## 2022-01-18 MED ORDER — PROPOFOL 500 MG/50ML IV EMUL
INTRAVENOUS | Status: DC | PRN
  Administered 2022-01-18: 180 ug/kg/min via INTRAVENOUS

## 2022-01-18 NOTE — Anesthesia Preprocedure Evaluation (Addendum)
Anesthesia Evaluation  Patient identified by MRN, date of birth, ID band Patient awake    Reviewed: Allergy & Precautions, NPO status , Patient's Chart, lab work & pertinent test results  Airway Mallampati: II  TM Distance: >3 FB Neck ROM: Full    Dental  (+) Dental Advisory Given, Missing   Pulmonary shortness of breath and with exertion, COPD,  COPD inhaler, former smoker,  Hemoptysis, following pulmonary   Pulmonary exam normal breath sounds clear to auscultation       Cardiovascular hypertension, Pt. on medications Normal cardiovascular exam Rhythm:Regular Rate:Normal     Neuro/Psych PSYCHIATRIC DISORDERS Anxiety Depression  Neuromuscular disease    GI/Hepatic Neg liver ROS, hiatal hernia, PUD, GERD  Medicated and Controlled,  Endo/Other  negative endocrine ROS  Renal/GU Renal disease  Female GU complaint (uterine cancer)     Musculoskeletal  (+) Arthritis , Osteoarthritis,  Fibromyalgia -  Abdominal   Peds negative pediatric ROS (+)  Hematology negative hematology ROS (+)   Anesthesia Other Findings IMPRESSION: 1. Solid 3 mm right middle lobe pulmonary nodule, is favored infectious or inflammatory but technically nonspecific, recommend attention on close interval follow-up chest imaging. 2. Band of subpleural reticulations with adjacent ground-glass in the lateral segment of the right middle lobe without discrete nodularity. Findings are favored to represent an infectious or inflammatory process. 3. Hepatic steatosis. 4. Layering stones within a 19 mm hypodense area in the left upper pole kidney, present dating back to September 19, 2007 most consistent with a calyceal diverticulum. 5.  Aortic Atherosclerosis (ICD10-I70.0).   Electronically Signed   By: Dahlia Bailiff M.D.   On: 11/29/2021 17:05  Reproductive/Obstetrics negative OB ROS                            Anesthesia  Physical Anesthesia Plan  ASA: 3  Anesthesia Plan: General   Post-op Pain Management: Minimal or no pain anticipated   Induction: Intravenous  PONV Risk Score and Plan:   Airway Management Planned: Nasal Cannula and Natural Airway  Additional Equipment:   Intra-op Plan:   Post-operative Plan:   Informed Consent: I have reviewed the patients History and Physical, chart, labs and discussed the procedure including the risks, benefits and alternatives for the proposed anesthesia with the patient or authorized representative who has indicated his/her understanding and acceptance.     Dental advisory given  Plan Discussed with: CRNA and Surgeon  Anesthesia Plan Comments:        Anesthesia Quick Evaluation

## 2022-01-18 NOTE — Op Note (Signed)
[facility] Patient Name: Susan Baker Procedure Date: 01/18/2022 1:15 PM MRN: [id number] Date of Birth: 1961/03/25 Attending MD: Norvel Richards , MD CSN: [id number] Age: 61 Admit Type: Outpatient Procedure:                Upper GI endoscopy Indications:              Dyspepsia; "hemoptysis" Providers:                Norvel Richards, MD, Lurline Del, RN, Everardo Pacific, Aram Candela Referring MD:             Norvel Richards, MD Medicines:                Propofol per Anesthesia Complications:            No immediate complications. Estimated Blood Loss:     Estimated blood loss was minimal. Procedure:                Pre-Anesthesia Assessment:                           - Prior to the procedure, a History and Physical                            was performed, and patient medications and                            allergies were reviewed. The patient's tolerance of                            previous anesthesia was also reviewed. The risks                            and benefits of the procedure and the sedation                            options and risks were discussed with the patient.                            All questions were answered, and informed consent                            was obtained. Prior Anticoagulants: The patient has                            taken no previous anticoagulant or antiplatelet                            agents. ASA Grade Assessment: III - A patient with                            severe systemic disease. After reviewing the risks  and benefits, the patient was deemed in                            satisfactory condition to undergo the procedure.                           After obtaining informed consent, the endoscope was                            passed under direct vision. Throughout the                            procedure, the patient's blood pressure, pulse, and                             oxygen saturations were monitored continuously. The                            GIF-H190 ([id number]) scope was introduced through the                            mouth, and advanced to the second part of duodenum.                            The upper GI endoscopy was accomplished without                            difficulty. The patient tolerated the procedure                            well. The upper GI endoscopy was accomplished                            without difficulty. The patient tolerated the                            procedure well. Scope In: 1:32:28 PM Scope Out: 1:39:14 PM Total Procedure Duration: 0 hours 6 minutes 46 seconds  Findings:      Hypopharynx appeared entirely normal.      The examined esophagus was normal. Stomach - linear circumferential       antral erosions. No ulcer or infiltrating process. Small hiatal hernia.       Pylorus patent.      This was biopsied with a cold forceps for histology. Estimated blood       loss was minimal.      The duodenal bulb and second portion of the duodenum were normal. Impression:               - Normal esophagus. Linear antral erosions. Query                            gastric antral vascular ectasia. Status post biopsy                           - Normal duodenal bulb  and second portion of the                            duodenum.                           -Patient's complaint of hemoptysis more likely                            emanating from ENT/pulmonary etiology. Moderate Sedation:      Moderate (conscious) sedation was personally administered by an       anesthesia professional. The following parameters were monitored: oxygen       saturation, heart rate, blood pressure, respiratory rate, EKG, adequacy       of pulmonary ventilation, and response to care. Recommendation:           - Patient has a contact number available for                            emergencies. The signs and symptoms of potential                             delayed complications were discussed with the                            patient. Return to normal activities tomorrow.                            Written discharge instructions were provided to the                            patient.                           - Advance diet as tolerated.                           - Continue present medications. Continue twice                            daily PPI                           - Return to my office. 3 weeks. Keep your                            appointment for a bronchoscopy. Procedure Code(s):        --- Professional ---                           [phone], Esophagogastroduodenoscopy, flexible,                            transoral; with biopsy, single or multiple Diagnosis Code(s):        --- Professional ---  R10.13, Epigastric pain CPT copyright 2019 American Medical Association. All rights reserved. The codes documented in this report are preliminary and upon coder review may  be revised to meet current compliance requirements. [name] [name], MD Norvel Richards, MD 01/18/2022 1:51:13 PM This report has been signed electronically. Number of Addenda: 0

## 2022-01-18 NOTE — Anesthesia Postprocedure Evaluation (Signed)
Anesthesia Post Note  Patient: Susan Baker  Procedure(s) Performed: ESOPHAGOGASTRODUODENOSCOPY (EGD) WITH PROPOFOL BIOPSY  Patient location during evaluation: Phase II Anesthesia Type: General Level of consciousness: awake and alert and oriented Pain management: pain level controlled Vital Signs Assessment: post-procedure vital signs reviewed and stable Respiratory status: spontaneous breathing, nonlabored ventilation and respiratory function stable Cardiovascular status: blood pressure returned to baseline and stable Postop Assessment: no apparent nausea or vomiting Anesthetic complications: no   No notable events documented.   Last Vitals:  Vitals:   01/18/22 1112 01/18/22 1343  BP: 114/62   Pulse: 74 79  Resp: 16 18  Temp:  36.7 C  SpO2: 98% 94%    Last Pain:  Vitals:   01/18/22 1343  TempSrc: Oral  PainSc: 0-No pain                 Devri Kreher C Novali Vollman

## 2022-01-18 NOTE — Transfer of Care (Signed)
Immediate Anesthesia Transfer of Care Note  Patient: Susan Baker  Procedure(s) Performed: ESOPHAGOGASTRODUODENOSCOPY (EGD) WITH PROPOFOL BIOPSY  Patient Location: PACU  Anesthesia Type:General  Level of Consciousness: awake, alert  and oriented  Airway & Oxygen Therapy: Patient Spontanous Breathing  Post-op Assessment: Report given to RN, Patient moving all extremities X 4 and Patient able to stick tongue midline  Post vital signs: Reviewed  Last Vitals:  Vitals Value Taken Time  BP 86/51   Temp 36.7 C 01/18/22 1343  Pulse 79 01/18/22 1343  Resp 18 01/18/22 1343  SpO2 94 % 01/18/22 1343    Last Pain:  Vitals:   01/18/22 1343  TempSrc: Oral  PainSc: 0-No pain         Complications: No notable events documented.

## 2022-01-18 NOTE — H&P (Signed)
$'@LOGO'y$ @   Primary Care Physician:  Jani Gravel, MD Primary Gastroenterologist:  Dr. Daiva Nakayama  Pre-Procedure History & Physical: HPI:  Susan Baker is a 61 y.o. female here for Further evaluation of hemoptysis dysphagia.  Past Medical History:  Diagnosis Date   Abnormal Pap smear    Abnormal ThinPrep Pap test of vagina 04/25/2017   LSIL will get colp__________   Anxiety    Bulging disc    cervical, [facility]   COPD (chronic obstructive pulmonary disease) ([place])    Depression 02/18/2013   Fibromyalgia    GERD (gastroesophageal reflux disease)    egd with RE per patient, remote   Hematuria 05/14/2014   History of abnormal cervical Pap smear 02/24/2014   Hypertension    Osteoarthritis (arthritis due to wear and tear of joints)    Osteoporosis    Osteoporosis, unspecified 03/03/2014   Postcoital bleeding 09/15/2015   Postmenopausal 02/24/2014   PUD (peptic ulcer disease)    bleeding PUD per patient, remote, On Daypro   Urinary frequency 05/14/2014   Uterine cancer ([place])    Vaginal Pap smear, abnormal     Past Surgical History:  Procedure Laterality Date   CHOLECYSTECTOMY N/A 11/07/2019   Procedure: LAPAROSCOPIC CHOLECYSTECTOMY;  Surgeon: Virl Cagey, MD;  Location: [facility];  Service: General;  Laterality: N/A;   COLONOSCOPY WITH PROPOFOL N/A 11/22/2015   Dr.Delara Shepheard- the entire examined colon is normal, the examined portion of the ileum was normal, non-bleeding internal hemorrhoids. no specimens collected.    ECTOPIC PREGNANCY SURGERY     twice   ESOPHAGEAL DILATION N/A 11/22/2015   Procedure: ESOPHAGEAL DILATION;  Surgeon: Daneil Dolin, MD;  Location: [facility] SUITE;  Service: Endoscopy;  Laterality: N/A;   ESOPHAGOGASTRODUODENOSCOPY  12/01/2010   Dr. Gala Romney: mild distal ERE, antral erosions due to NSAIDS, no H.Pylori   ESOPHAGOGASTRODUODENOSCOPY (EGD) WITH PROPOFOL N/A 11/22/2015   Dr.Zade Falkner- grade A esophagitis, dilated. small hiatal hernia, normal stomach, normal second  portion of the duodenum.   ESOPHAGOGASTRODUODENOSCOPY (EGD) WITH PROPOFOL N/A 10/27/2019   normal esophagus, normal stomach, normal duodenum.    hysterectomy for uterine cancer     partial    Prior to Admission medications   Medication Sig Start Date End Date Taking? Authorizing Provider  FLUoxetine (PROZAC) 40 MG capsule Take 40 mg by mouth daily.   Yes [provider]  HYDROcodone-acetaminophen (NORCO/VICODIN) 5-325 MG tablet Take 1 tablet by mouth every 4 (four) hours as needed for moderate pain. Take 1 tablet 4 times daily   Yes [provider]  lisinopril (ZESTRIL) 10 MG tablet Take 10 mg by mouth daily.   Yes [provider]  methocarbamol (ROBAXIN) 500 MG tablet Take 500 mg by mouth every 8 (eight) hours as needed for muscle spasms.   Yes [provider]  ondansetron (ZOFRAN-ODT) 4 MG disintegrating tablet Take 1 tablet (4 mg total) by mouth every 8 (eight) hours as needed for nausea or vomiting. 11/22/21  Yes Annitta Needs, NP  pantoprazole (PROTONIX) 40 MG tablet Take 1 tablet (40 mg total) by mouth 2 (two) times daily before a meal. 11/24/21  Yes Annitta Needs, NP  pregabalin (LYRICA) 150 MG capsule Take 150 mg by mouth 2 (two) times daily. 03/19/19  Yes [provider]  Tiotropium Bromide Monohydrate 1.25 MCG/ACT AERS Inhale 1.25 mcg into the lungs daily as needed (Breathing issues).   Yes [provider]  VENTOLIN HFA 108 (90 Base) MCG/ACT inhaler Inhale 1 puff into  the lungs every 4 (four) hours as needed for shortness of breath. 11/10/21  Yes [provider]    Allergies as of 11/24/2021 - Review Complete 11/22/2021  Allergen Reaction Noted   Cymbalta [duloxetine hcl] Other (See Comments) 02/22/2012   Daypro [oxaprozin] Other (See Comments) 11/07/2010   Sulfa antibiotics Other (See Comments) 02/22/2012   Trazodone and nefazodone Other (See Comments) 02/22/2012   Nucynta [tapentadol] Other (See Comments) 08/18/2015     Family History  Problem Relation Age of Onset   Cirrhosis Father        etoh, died with liver cancer   Diabetes Father    Cirrhosis Paternal Uncle        multiple, etoh   Pancreatic cancer Paternal Grandmother    Diabetes Paternal Grandmother    Kidney failure Mother    Diabetes Mother    Other Brother        crohn's disease   Hyperlipidemia Brother    Fibromyalgia Daughter    Diabetes Maternal Grandmother    Healthy Son    Colon cancer Maternal Uncle    Colon polyps Neg Hx     Social History   Socioeconomic History   Marital status: Divorced    Spouse name: Not on file   Number of children: 2   Years of education: Not on file   Highest education level: Not on file  Occupational History   Occupation: disability  Tobacco Use   Smoking status: Former    Packs/day: 0.50    Years: 31.00    Total pack years: 15.50    Types: E-cigarettes, Cigarettes    Quit date: 07/23/2019    Years since quitting: 2.4   Smokeless tobacco: Never   Tobacco comments:    using vape at times  Vaping Use   Vaping Use: Some days   Substances: Nicotine-salt  Substance and Sexual Activity   Alcohol use: Yes    Alcohol/week: 0.0 standard drinks of alcohol    Comment: occasional social use    Drug use: No   Sexual activity: Not Currently    Birth control/protection: Surgical    Comment: hysterectomy  Other Topics Concern   Not on file  Social History Narrative   Not on file   Social Determinants of Health   Financial Resource Strain: Not on file  Food Insecurity: No Food Insecurity (12/22/2019)   Hunger Vital Sign    Worried About Running Out of Food in the Last Year: Never true    Ran Out of Food in the Last Year: Never true  Transportation Needs: No Transportation Needs (12/22/2019)   PRAPARE - Hydrologist (Medical): No    Lack of Transportation (Non-Medical): No  Physical Activity: Sufficiently Active (12/22/2019)   Exercise Vital Sign    Days  of Exercise per Week: 5 days    Minutes of Exercise per Session: 30 min  Stress: Stress Concern Present (12/22/2019)   [place]    Feeling of Stress : Rather much  Social Connections: Moderately Isolated (12/22/2019)   Social Connection and Isolation Panel [NHANES]    Frequency of Communication with Friends and Family: More than three times a week    Frequency of Social Gatherings with Friends and Family: Twice a week    Attends Religious Services: More than 4 times per year    Active Member of [employer] or Organizations: No    Attends Archivist Meetings: Never  Marital Status: Divorced  Human resources officer Violence: Not At Risk (12/22/2019)   Humiliation, Afraid, Rape, and Kick questionnaire    Fear of Current or Ex-Partner: No    Emotionally Abused: No    Physically Abused: No    Sexually Abused: No    Review of Systems: See HPI, otherwise negative ROS  Physical Exam: BP 114/62   Pulse 74   Temp 98 F (36.7 C) (Oral)   Resp 16   SpO2 98%  General:   Alert,  Well-developed, well-nourished, pleasant and cooperative in NAD SExtremities:  Without clubbing or edema.  Impression/Plan:    61 year old here for further evaluation of hemoptysis/dysphagia. The risks, benefits, limitations, alternatives and imponderables have been reviewed with the patient. Potential for esophageal dilation, biopsy, etc. have also been reviewed.  Questions have been answered. All parties agreeable.      Notice: This dictation was prepared with Dragon dictation along with smaller phrase technology. Any transcriptional errors that result from this process are unintentional and may not be corrected upon review.

## 2022-01-18 NOTE — Discharge Instructions (Signed)
EGD Discharge instructions Please read the instructions outlined below and refer to this sheet in the next few weeks. These discharge instructions provide you with general information on caring for yourself after you leave the hospital. Your doctor may also give you specific instructions. While your treatment has been planned according to the most current medical practices available, unavoidable complications occasionally occur. If you have any problems or questions after discharge, please call your doctor. ACTIVITY You may resume your regular activity but move at a slower pace for the next 24 hours.  Take frequent rest periods for the next 24 hours.  Walking will help expel (get rid of) the air and reduce the bloated feeling in your abdomen.  No driving for 24 hours (because of the anesthesia (medicine) used during the test).  You may shower.  Do not sign any important legal documents or operate any machinery for 24 hours (because of the anesthesia used during the test).  NUTRITION Drink plenty of fluids.  You may resume your normal diet.  Begin with a light meal and progress to your normal diet.  Avoid alcoholic beverages for 24 hours or as instructed by your caregiver.  MEDICATIONS You may resume your normal medications unless your caregiver tells you otherwise.  WHAT YOU CAN EXPECT TODAY You may experience abdominal discomfort such as a feeling of fullness or "gas" pains.  FOLLOW-UP Your doctor will discuss the results of your test with you.  SEEK IMMEDIATE MEDICAL ATTENTION IF ANY OF THE FOLLOWING OCCUR: Excessive nausea (feeling sick to your stomach) and/or vomiting.  Severe abdominal pain and distention (swelling).  Trouble swallowing.  Temperature over 101 F (37.8 C).  Rectal bleeding or vomiting of blood.      Your esophagus appeared normal.    No dilation needed   stomach mildly inflamed-biopsies taken   further recommendations to follow pending review of pathology  report   it sounds like you are truly coughing up blood.  Therefore, ear nose and throat and/or pulmonary etiology is higher on the list of possibilities at this time   keep appointment for bronchoscopy.    Office visit with Roseanne Kaufman in 3 months   at patient request, I called Janett Billow Hard at [phone] -  reviewed findings and recommendations

## 2022-01-20 ENCOUNTER — Encounter: Payer: Self-pay | Admitting: Internal Medicine

## 2022-01-20 LAB — SURGICAL PATHOLOGY

## 2022-01-24 ENCOUNTER — Encounter (HOSPITAL_COMMUNITY): Payer: Self-pay | Admitting: Internal Medicine

## 2022-01-25 NOTE — Progress Notes (Signed)
Left voicemail with pre-op instructions, and instructions created for the pt to review on Mychart.

## 2022-01-25 NOTE — Pre-Procedure Instructions (Signed)
Same Day Surgery   Susan Baker  01/25/2022       Surgical Instructions   Your procedure is scheduled on Thurs., January 26, 2022 from 10:00AM-10:30AM.  Report to [facility] Main Entrance "A" at 7:00 A.M., then check in with the Admitting office.  Call this number if you have problems the morning of surgery:  [phone] Please report to [place] [address] for a Covid Test by 1pm on 01/25/22. If you are not able to make it, a covid test will be performed prior to your procedure tomorrow.    Remember:  Do not eat or drink after midnight on July 26th   Take these medicines the morning of surgery with A SIP OF WATER: FLUoxetine (PROZAC) Pantoprazole (PROTONIX) Pregabalin (LYRICA)  If Needed: HYDROcodone-acetaminophen (NORCO/VICODIN) Methocarbamol (ROBAXIN) Ondansetron (ZOFRAN-ODT) VENTOLIN HFA-  bring with you day of surgery  As of today, STOP taking any Aspirin (unless otherwise instructed by your surgeon) Aleve, Naproxen, Ibuprofen, Motrin, Advil, Goody's, BC's, all herbal medications, fish oil, and all vitamins.             Day of Surgery: Do not wear jewelry or makeup Do not wear lotions, powders, perfumes, or deodorant. Do not shave 48 hours prior to surgery.  Do not bring valuables to the hospital. DO Not wear nail polish, gel polish, artificial nails, or any other type of covering on natural nails (fingers and toes) If you have artificial nails or gel coating that need to be removed by a nail salon, please have this removed prior to surgery. Artificial nails or gel coating may interfere with anesthesia's ability to adequately monitor your vital signs.             [facility] is not responsible for any belongings or valuables.  Do NOT Smoke (Tobacco/Vaping)  24 hours prior to your procedure  If you use a CPAP at night, you may bring your mask and machine for your overnight stay.   Contacts, glasses, hearing aids, dentures or partials may not be worn into  surgery, please bring cases for these belongings   For patients admitted to the hospital, discharge time will be determined by your treatment team.   Patients discharged the day of surgery will not be allowed to drive home, and someone needs to stay with them for 24 hours.   Special instructions:    Oral Hygiene is also important to reduce your risk of infection.  Remember - BRUSH YOUR TEETH THE MORNING OF SURGERY WITH YOUR REGULAR TOOTHPASTE   [facility] [facility]- Preparing For Surgery  Before surgery, you can play an important role. Because skin is not sterile, your skin needs to be as free of germs as possible. You can reduce the number of germs on your skin by washing with an Antibacterial Soap before surgery.    Please follow these instructions carefully.    Shower the NIGHT BEFORE SURGERY and the MORNING OF SURGERY with an Antibacterial Soap.   Pat yourself dry with a CLEAN TOWEL.  Wear CLEAN PAJAMAS to bed the night before surgery  Place CLEAN SHEETS on your bed the night before your surgery  DO NOT SLEEP WITH PETS.  Reminder: Take a shower with an Antibacterial soap. Wear Clean/Comfortable clothing the morning of surgery Do not apply any deodorants/lotions.   Remember to brush your teeth WITH YOUR REGULAR TOOTHPASTE.  If you have been in contact with anyone that has tested positive in the last 10 days please notify  you surgeon.  Notify your provider:  if you develop a fever of 100.4 or greater, sneezing, cough, sore throat, shortness of breath or body aches.  Please read over the following fact sheets that you were given.

## 2022-01-26 ENCOUNTER — Encounter (HOSPITAL_COMMUNITY)
Admission: RE | Disposition: A | Discharge status: Home / Self Care | Payer: Self-pay | Source: Home / Self Care | Attending: Emergency Medicine

## 2022-01-26 ENCOUNTER — Ambulatory Visit (HOSPITAL_BASED_OUTPATIENT_CLINIC_OR_DEPARTMENT_OTHER): Payer: Medicare Other | Admitting: Anesthesiology

## 2022-01-26 ENCOUNTER — Ambulatory Visit (HOSPITAL_COMMUNITY): Payer: Medicare Other | Admitting: Anesthesiology

## 2022-01-26 ENCOUNTER — Encounter (HOSPITAL_COMMUNITY): Payer: Self-pay | Admitting: Emergency Medicine

## 2022-01-26 ENCOUNTER — Other Ambulatory Visit: Payer: Self-pay

## 2022-01-26 ENCOUNTER — Ambulatory Visit (HOSPITAL_COMMUNITY)
Admission: RE | Admit: 2022-01-26 | Discharge: 2022-01-26 | Disposition: A | Discharge status: Home / Self Care | Payer: Medicare Other | Attending: Emergency Medicine | Admitting: Emergency Medicine

## 2022-01-26 DIAGNOSIS — R042 Hemoptysis: Secondary | ICD-10-CM | POA: Diagnosis present

## 2022-01-26 DIAGNOSIS — R918 Other nonspecific abnormal finding of lung field: Secondary | ICD-10-CM | POA: Insufficient documentation

## 2022-01-26 DIAGNOSIS — F419 Anxiety disorder, unspecified: Secondary | ICD-10-CM | POA: Diagnosis not present

## 2022-01-26 DIAGNOSIS — Z87891 Personal history of nicotine dependence: Secondary | ICD-10-CM | POA: Diagnosis not present

## 2022-01-26 DIAGNOSIS — K219 Gastro-esophageal reflux disease without esophagitis: Secondary | ICD-10-CM | POA: Insufficient documentation

## 2022-01-26 DIAGNOSIS — T17890A Other foreign object in other parts of respiratory tract causing asphyxiation, initial encounter: Secondary | ICD-10-CM | POA: Insufficient documentation

## 2022-01-26 DIAGNOSIS — Z8542 Personal history of malignant neoplasm of other parts of uterus: Secondary | ICD-10-CM | POA: Diagnosis not present

## 2022-01-26 DIAGNOSIS — Z20822 Contact with and (suspected) exposure to covid-19: Secondary | ICD-10-CM | POA: Insufficient documentation

## 2022-01-26 DIAGNOSIS — R9389 Abnormal findings on diagnostic imaging of other specified body structures: Secondary | ICD-10-CM

## 2022-01-26 DIAGNOSIS — J984 Other disorders of lung: Secondary | ICD-10-CM | POA: Diagnosis not present

## 2022-01-26 DIAGNOSIS — F32A Depression, unspecified: Secondary | ICD-10-CM | POA: Diagnosis not present

## 2022-01-26 DIAGNOSIS — M797 Fibromyalgia: Secondary | ICD-10-CM | POA: Diagnosis not present

## 2022-01-26 DIAGNOSIS — X58XXXA Exposure to other specified factors, initial encounter: Secondary | ICD-10-CM | POA: Insufficient documentation

## 2022-01-26 HISTORY — PX: BRONCHIAL BRUSHINGS: SHX5108

## 2022-01-26 HISTORY — PX: BRONCHIAL WASHINGS: SHX5105

## 2022-01-26 HISTORY — PX: BRONCHIAL BIOPSY: SHX5109

## 2022-01-26 HISTORY — PX: VIDEO BRONCHOSCOPY: SHX5072

## 2022-01-26 LAB — CBC
HCT: 33.8 % — ABNORMAL LOW (ref 36.0–46.0)
Hemoglobin: 11.5 g/dL — ABNORMAL LOW (ref 12.0–15.0)
MCH: 31 pg (ref 26.0–34.0)
MCHC: 34 g/dL (ref 30.0–36.0)
MCV: 91.1 fL (ref 80.0–100.0)
Platelets: 196 10*3/uL (ref 150–400)
RBC: 3.71 MIL/uL — ABNORMAL LOW (ref 3.87–5.11)
RDW: 12.8 % (ref 11.5–15.5)
WBC: 5.7 10*3/uL (ref 4.0–10.5)
nRBC: 0 % (ref 0.0–0.2)

## 2022-01-26 LAB — BASIC METABOLIC PANEL
Anion gap: 7 (ref 5–15)
BUN: 12 mg/dL (ref 6–20)
CO2: 25 mmol/L (ref 22–32)
Calcium: 9.3 mg/dL (ref 8.9–10.3)
Chloride: 109 mmol/L (ref 98–111)
Creatinine, Ser: 0.93 mg/dL (ref 0.44–1.00)
GFR, Estimated: >60 mL/min (ref >60)
Glucose, Bld: 96 mg/dL (ref 70–99)
Potassium: 3.8 mmol/L (ref 3.5–5.1)
Sodium: 141 mmol/L (ref 135–145)

## 2022-01-26 LAB — BODY FLUID CELL COUNT WITH DIFFERENTIAL
Eos, Fluid: 0 %
Lymphs, Fluid: 37 %
Monocyte-Macrophage-Serous Fluid: 49 % — ABNORMAL LOW (ref 50–90)
Neutrophil Count, Fluid: 14 % (ref 0–25)
Total Nucleated Cell Count, Fluid: 97 cu mm (ref 0–1000)

## 2022-01-26 LAB — SARS CORONAVIRUS 2 BY RT PCR: SARS Coronavirus 2 by RT PCR: NEGATIVE

## 2022-01-26 SURGERY — VIDEO BRONCHOSCOPY WITHOUT FLUORO
Anesthesia: General

## 2022-01-26 MED ORDER — ROCURONIUM BROMIDE 10 MG/ML (PF) SYRINGE
PREFILLED_SYRINGE | INTRAVENOUS | Status: DC | PRN
  Administered 2022-01-26: 70 mg via INTRAVENOUS

## 2022-01-26 MED ORDER — LIDOCAINE 2% (20 MG/ML) 5 ML SYRINGE
INTRAMUSCULAR | Status: DC | PRN
  Administered 2022-01-26: 20 mg via INTRAVENOUS

## 2022-01-26 MED ORDER — DEXAMETHASONE SODIUM PHOSPHATE 10 MG/ML IJ SOLN
INTRAMUSCULAR | Status: DC | PRN
  Administered 2022-01-26: 10 mg via INTRAVENOUS

## 2022-01-26 MED ORDER — CHLORHEXIDINE GLUCONATE 0.12 % MT SOLN
OROMUCOSAL | Status: DC
Start: 2022-01-26 — End: 2022-01-26
  Filled 2022-01-26: qty 15

## 2022-01-26 MED ORDER — PROPOFOL 10 MG/ML IV BOLUS
INTRAVENOUS | Status: DC | PRN
  Administered 2022-01-26: 150 mg via INTRAVENOUS

## 2022-01-26 MED ORDER — LACTATED RINGERS IV SOLN: INTRAVENOUS | Status: DC

## 2022-01-26 MED ORDER — FENTANYL CITRATE (PF) 250 MCG/5ML IJ SOLN
INTRAMUSCULAR | Status: DC | PRN
  Administered 2022-01-26 (×2): 50 ug via INTRAVENOUS

## 2022-01-26 MED ORDER — SUGAMMADEX SODIUM 200 MG/2ML IV SOLN
INTRAVENOUS | Status: DC | PRN
  Administered 2022-01-26: 400 mg via INTRAVENOUS

## 2022-01-26 MED ORDER — ONDANSETRON HCL 4 MG/2ML IJ SOLN
INTRAMUSCULAR | Status: DC | PRN
  Administered 2022-01-26: 4 mg via INTRAVENOUS

## 2022-01-26 MED ORDER — CHLORHEXIDINE GLUCONATE 0.12 % MT SOLN
15.0000 mL | Freq: Once | OROMUCOSAL | Status: AC
  Administered 2022-01-26: 15 mL via OROMUCOSAL
  Filled 2022-01-26: qty 15

## 2022-01-26 MED ORDER — ALBUTEROL SULFATE HFA 108 (90 BASE) MCG/ACT IN AERS
INHALATION_SPRAY | RESPIRATORY_TRACT | Status: DC | PRN
  Administered 2022-01-26: 2 via RESPIRATORY_TRACT

## 2022-01-26 NOTE — Anesthesia Preprocedure Evaluation (Signed)
Anesthesia Evaluation  Patient identified by MRN, date of birth, ID band Patient awake    Reviewed: Allergy & Precautions, NPO status , Patient's Chart, lab work & pertinent test results  History of Anesthesia Complications Negative for: history of anesthetic complications  Airway Mallampati: II  TM Distance: >3 FB Neck ROM: Full    Dental  (+) Dental Advisory Given, Teeth Intact   Pulmonary COPD, former smoker,    Pulmonary exam normal        Cardiovascular hypertension, Normal cardiovascular exam     Neuro/Psych Anxiety Depression negative neurological ROS     GI/Hepatic Neg liver ROS, hiatal hernia, PUD, GERD  ,  Endo/Other  negative endocrine ROS  Renal/GU negative Renal ROS  negative genitourinary   Musculoskeletal  (+) Arthritis , Fibromyalgia -  Abdominal   Peds  Hematology  (+) Blood dyscrasia, anemia ,   Anesthesia Other Findings   Reproductive/Obstetrics                            Anesthesia Physical Anesthesia Plan  ASA: 2  Anesthesia Plan: General   Post-op Pain Management: Minimal or no pain anticipated   Induction: Intravenous  PONV Risk Score and Plan: 3 and Ondansetron, Dexamethasone, Treatment may vary due to age or medical condition and Midazolam  Airway Management Planned: Oral ETT  Additional Equipment: None  Intra-op Plan:   Post-operative Plan: Extubation in OR  Informed Consent: I have reviewed the patients History and Physical, chart, labs and discussed the procedure including the risks, benefits and alternatives for the proposed anesthesia with the patient or authorized representative who has indicated his/her understanding and acceptance.     Dental advisory given  Plan Discussed with:   Anesthesia Plan Comments:         Anesthesia Quick Evaluation

## 2022-01-26 NOTE — Transfer of Care (Signed)
Immediate Anesthesia Transfer of Care Note  Patient: Susan Baker  Procedure(s) Performed: VIDEO BRONCHOSCOPY WITHOUT FLUORO  Patient Location: PACU  Anesthesia Type:General  Level of Consciousness: awake, alert  and oriented  Airway & Oxygen Therapy: Patient connected to face mask oxygen  Post-op Assessment: Post -op Vital signs reviewed and stable  Post vital signs: stable  Last Vitals:  Vitals Value Taken Time  BP    Temp    Pulse    Resp    SpO2      Last Pain:  Vitals:   01/26/22 0742  TempSrc:   PainSc: 5       Patients Stated Pain Goal: 3 (73/53/29 9242)  Complications: No notable events documented.

## 2022-01-26 NOTE — Interval H&P Note (Signed)
History and Physical Interval Note:  01/26/2022 9:33 AM  Susan Baker  has presented today for surgery, with the diagnosis of hemoptysis.  The various methods of treatment have been discussed with the patient and family. After consideration of risks, benefits and other options for treatment, the patient has consented to  Procedure(s): VIDEO BRONCHOSCOPY WITHOUT FLUORO (N/A) as a surgical intervention.  The patient's history has been reviewed, patient examined, no change in status, stable for surgery.  I have reviewed the patient's chart and labs.  Questions were answered to the patient's satisfaction.     Collene Gobble

## 2022-01-26 NOTE — Op Note (Signed)
[facility] Cardiopulmonary Patient Name: Susan Baker Pocedure Date: 01/26/2022 MRN: [id number] Attending MD: Collene Gobble , MD Date of Birth: [birth year] CSN: Finalized Age: 61 Admit Type: Inpatient Gender: Female Procedure:             Bronchoscopy Indications:           Hemoptysis with abnormal CXR, Infiltrate of the right                         middle lobe Providers:             Collene Gobble, MD, Benay Pillow, RN, Cherylynn Ridges,                         Technician Referring MD:          Collene Gobble, MD Medicines:             General Anesthesia Complications:         No immediate complications Estimated Blood Loss:  Estimated blood loss: none. Procedure:             Pre-Anesthesia Assessment:                        - A History and Physical has been performed. Patient                         meds and allergies have been reviewed. The risks and                         benefits of the procedure and the sedation options and                         risks were discussed with the patient. All questions                         were answered and informed consent was obtained.                         Patient identification and proposed procedure were                         verified prior to the procedure by the physician in                         the pre-procedure area. Mental Status Examination:                         alert and oriented. Airway Examination: normal                         oropharyngeal airway. Respiratory Examination: clear                         to auscultation. CV Examination: normal. ASA Grade                         Assessment: I - A normal healthy patient. After  reviewing the risks and benefits, the patient was                         deemed in satisfactory condition to undergo the                         procedure. The anesthesia plan was to use general                         anesthesia. Immediately prior to  administration of                         medications, the patient was re-assessed for adequacy                         to receive sedatives. The heart rate, respiratory                         rate, oxygen saturations, blood pressure, adequacy of                         pulmonary ventilation, and response to care were                         monitored throughout the procedure. The physical                         status of the patient was re-assessed after the                         procedure.                        After obtaining informed consent, the bronchoscope was                         passed under direct vision. Throughout the procedure,                         the patient's blood pressure, pulse, and oxygen                         saturations were monitored continuously. the [id number]                         )[id number]) Olympus broncoscope was introduced through                         the mouth, via the endotracheal tube (the patient was                         intubated for the procedure) and advanced to the                         tracheobronchial tree. The procedure was accomplished                         without difficulty. The patient tolerated the  procedure well. Scope In: Scope Out: Findings:      The endotracheal tube is in good position. The visualized portion of the       trachea is of normal caliber. The carina is sharp. The tracheobronchial       tree was examined to at least the first subsegmental level. Bronchial       mucosa and anatomy are normal; there are no endobronchial lesions, and       no secretions. Narrowing was found in the posterior basal segment of the       left lower lobe (B10). The lesion has a benign appearance. The airway is       minimally narrowed. The lesion was successfully traversed. Guided       brushings were obtained in the posterior basal segment of the left lower       lobe with a cytology brush and sent for  routine cytology. Two samples       were obtained. Endobronchial biopsies were performed in the posterior       basal segment of the left lower lobe using forceps and sent for       histopathology examination. Four samples were obtained. BAL was       performed in the RML lateral segment (B4) of the lung and sent for cell       count, bacterial culture, viral smears & culture, and fungal & AFB       analysis and cytology. 50 mL of fluid were instilled. 25 mL were       returned. The return was clear. Mucous plugs were present in the return       fluid. Impression:            - Hemoptysis with abnormal CXR                        - Infiltrate of the right middle lobe                        - The airway examination was normal.                        - A narrowing was found in the posterior basal segment                         of the left lower lobe (B10). The lesion has a benign                         appearance.                        - Brushings were obtained LLL.                        - An endobronchial biopsy was performed LLL.                        - Bronchoalveolar lavage was performed RML. Moderate Sedation:      Performed under general anesthesia Recommendation:        - Await BAL, biopsy and brushing results. Procedure Code(s):     --- Professional ---  31625, Bronchoscopy, rigid or flexible, including                         fluoroscopic guidance, when performed; with bronchial                         or endobronchial biopsy(s), single or multiple sites                        77116, Bronchoscopy, rigid or flexible, including                         fluoroscopic guidance, when performed; with bronchial                         alveolar lavage                        31623, Bronchoscopy, rigid or flexible, including                         fluoroscopic guidance, when performed; with brushing                         or protected brushings Diagnosis Code(s):      --- Professional ---                        R04.2, Hemoptysis                        R91.8, Other nonspecific abnormal finding of lung field                        J98.4, Other disorders of lung CPT copyright 2019 American Medical Association. All rights reserved. The codes documented in this report are preliminary and upon coder review may  be revised to meet current compliance requirements. Collene Gobble, MD Collene Gobble, MD 01/26/2022 11:07:12 AM Number of Addenda: 0

## 2022-01-26 NOTE — Anesthesia Procedure Notes (Signed)
Procedure Name: Intubation Date/Time: 01/26/2022 10:27 AM  Performed by: Lavell Luster, CRNAPre-anesthesia Checklist: Patient identified, Emergency Drugs available, Suction available, Patient being monitored and Timeout performed Patient Re-evaluated:Patient Re-evaluated prior to induction Oxygen Delivery Method: Circle system utilized Preoxygenation: Pre-oxygenation with 100% oxygen Induction Type: IV induction Ventilation: Mask ventilation without difficulty Laryngoscope Size: Mac, 3 and Glidescope Grade View: Grade I Tube type: Oral Tube size: 8.5 mm Number of attempts: 1 Airway Equipment and Method: Stylet and Video-laryngoscopy Placement Confirmation: ETT inserted through vocal cords under direct vision, positive ETCO2 and breath sounds checked- equal and bilateral Secured at: 21 cm Tube secured with: Tape Dental Injury: Teeth and Oropharynx as per pre-operative assessment

## 2022-01-26 NOTE — Discharge Instructions (Signed)
Flexible Bronchoscopy, Care After This sheet gives you information about how to care for yourself after your test. Your doctor may also give you more specific instructions. If you have problems or questions, contact your doctor. Follow these instructions at home: Eating and drinking Do not eat or drink anything (not even water) for 2 hours after your test, or until your numbing medicine (local anesthetic) wears off. When your numbness is gone and your cough and gag reflexes have come back, you may: Eat only soft foods. Slowly drink liquids. The day after the test, go back to your normal diet. Driving Do not drive for 24 hours if you were given a medicine to help you relax (sedative). Do not drive or use heavy machinery while taking prescription pain medicine. General instructions  Take over-the-counter and prescription medicines only as told by your doctor. Return to your normal activities as told. Ask what activities are safe for you. Do not use any products that have nicotine or tobacco in them. This includes cigarettes and e-cigarettes. If you need help quitting, ask your doctor. Keep all follow-up visits as told by your doctor. This is important. It is very important if you had a tissue sample (biopsy) taken. Get help right away if: You have shortness of breath that gets worse. You get light-headed. You feel like you are going to pass out (faint). You have chest pain. You cough up: More than a little blood. More blood than before. Summary Do not eat or drink anything (not even water) for 2 hours after your test, or until your numbing medicine wears off. Do not use cigarettes. Do not use e-cigarettes. Get help right away if you have chest pain.    This information is not intended to replace advice given to you by your health care provider. Make sure you discuss any questions you have with your health care provider. Document Released: 04/16/2009 Document Revised: 06/01/2017  Document Reviewed: 07/07/2016 Elsevier Patient Education  2020 [employer].

## 2022-01-26 NOTE — Anesthesia Postprocedure Evaluation (Signed)
Anesthesia Post Note  Patient: Susan Baker  Procedure(s) Performed: VIDEO BRONCHOSCOPY WITHOUT FLUORO     Patient location during evaluation: PACU Anesthesia Type: General Level of consciousness: awake and alert Pain management: pain level controlled Vital Signs Assessment: post-procedure vital signs reviewed and stable Respiratory status: spontaneous breathing, nonlabored ventilation and respiratory function stable Cardiovascular status: blood pressure returned to baseline and stable Postop Assessment: no apparent nausea or vomiting Anesthetic complications: no   No notable events documented.  Last Vitals:  Vitals:   01/26/22 1119 01/26/22 1134  BP: 119/76 115/82  Pulse: 72 71  Resp: 17 14  Temp:  36.7 C  SpO2: 97% 96%    Last Pain:  Vitals:   01/26/22 1134  TempSrc:   PainSc: 0-No pain                 Lidia Collum

## 2022-01-27 LAB — CYTOLOGY - NON PAP

## 2022-01-27 LAB — SURGICAL PATHOLOGY

## 2022-01-29 ENCOUNTER — Encounter (HOSPITAL_COMMUNITY): Payer: Self-pay | Admitting: Emergency Medicine

## 2022-01-31 LAB — AEROBIC/ANAEROBIC CULTURE W GRAM STAIN (SURGICAL/DEEP WOUND): Culture: NO GROWTH

## 2022-02-06 LAB — ACID FAST SMEAR (AFB, MYCOBACTERIA)

## 2022-02-06 LAB — ACID FAST CULTURE WITH REFLEXED SENSITIVITIES (MYCOBACTERIA)

## 2022-02-06 LAB — FUNGUS CULTURE WITH STAIN

## 2022-02-08 ENCOUNTER — Ambulatory Visit: Payer: Medicare Other | Admitting: Urology

## 2022-02-08 NOTE — Progress Notes (Deleted)
Assessment: 1. Nephrolithiasis      Plan: ***  Chief Complaint: No chief complaint on file.   History of Present Illness:  Susan Baker is a 61 y.o. female who is seen in consultation from Jani Gravel, MD  for evaluation of ***.   Past Medical History:  Past Medical History:  Diagnosis Date   Abnormal Pap smear    Abnormal ThinPrep Pap test of vagina 04/25/2017   LSIL will get colp__________   Anxiety    Bulging disc    cervical, [facility]   COPD (chronic obstructive pulmonary disease) ([place])    Depression 02/18/2013   Fibromyalgia    GERD (gastroesophageal reflux disease)    egd with RE per patient, remote   Hematuria 05/14/2014   History of abnormal cervical Pap smear 02/24/2014   Hypertension    Osteoarthritis (arthritis due to wear and tear of joints)    Osteoporosis    Osteoporosis, unspecified 03/03/2014   Postcoital bleeding 09/15/2015   Postmenopausal 02/24/2014   PUD (peptic ulcer disease)    bleeding PUD per patient, remote, On Daypro   Urinary frequency 05/14/2014   Uterine cancer ([place])    Vaginal Pap smear, abnormal     Past Surgical History:  Past Surgical History:  Procedure Laterality Date   BIOPSY  01/18/2022   Procedure: BIOPSY;  Surgeon: Daneil Dolin, MD;  [facility];  Service: Endoscopy;;   BRONCHIAL BIOPSY  01/26/2022   Procedure: BRONCHIAL BIOPSIES;  Surgeon: Collene Gobble, MD;  Location: [place];  Service: Cardiopulmonary;;   BRONCHIAL BRUSHINGS  01/26/2022   Procedure: BRONCHIAL BRUSHINGS;  Surgeon: Collene Gobble, MD;  Location: [place];  Service: Cardiopulmonary;;   BRONCHIAL WASHINGS  01/26/2022   Procedure: BRONCHIAL WASHINGS;  Surgeon: Collene Gobble, MD;  Location: [place];  Service: Cardiopulmonary;;   CHOLECYSTECTOMY N/A 11/07/2019   Procedure: LAPAROSCOPIC CHOLECYSTECTOMY;  Surgeon: Virl Cagey, MD;  Location: [facility];  Service: General;  Laterality: N/A;   COLONOSCOPY WITH PROPOFOL N/A  11/22/2015   Dr.Rourk- the entire examined colon is normal, the examined portion of the ileum was normal, non-bleeding internal hemorrhoids. no specimens collected.    ECTOPIC PREGNANCY SURGERY     twice   ESOPHAGEAL DILATION N/A 11/22/2015   Procedure: ESOPHAGEAL DILATION;  Surgeon: Daneil Dolin, MD;  [facility];  Service: Endoscopy;  Laterality: N/A;   ESOPHAGOGASTRODUODENOSCOPY  12/01/2010   Dr. Gala Romney: mild distal ERE, antral erosions due to NSAIDS, no H.Pylori   ESOPHAGOGASTRODUODENOSCOPY (EGD) WITH PROPOFOL N/A 11/22/2015   Dr.Rourk- grade A esophagitis, dilated. small hiatal hernia, normal stomach, normal second portion of the duodenum.   ESOPHAGOGASTRODUODENOSCOPY (EGD) WITH PROPOFOL N/A 10/27/2019   normal esophagus, normal stomach, normal duodenum.    ESOPHAGOGASTRODUODENOSCOPY (EGD) WITH PROPOFOL N/A 01/18/2022   Procedure: ESOPHAGOGASTRODUODENOSCOPY (EGD) WITH PROPOFOL;  Surgeon: Daneil Dolin, MD;  [facility];  Service: Endoscopy;  Laterality: N/A;  12:45pm   hysterectomy for uterine cancer     partial   VIDEO BRONCHOSCOPY N/A 01/26/2022   Procedure: VIDEO BRONCHOSCOPY WITHOUT FLUORO;  Surgeon: Collene Gobble, MD;  Location: [place];  Service: Cardiopulmonary;  Laterality: N/A;    Allergies:  Allergies  Allergen Reactions   Cymbalta [Duloxetine Hcl] Other (See Comments)    Depression, anxiety, wt gain   Daypro [Oxaprozin] Other (See Comments)    Ulcers   Sulfa Antibiotics Other (See Comments)    Bactrim-Could not function   Trazodone  And Nefazodone Other (See Comments)    Could not function   Nucynta [Tapentadol] Other (See Comments)    Memory loss and slurred speech    Family History:  Family History  Problem Relation Age of Onset   Cirrhosis Father        etoh, died with liver cancer   Diabetes Father    Cirrhosis Paternal Uncle        multiple, etoh   Pancreatic cancer Paternal Grandmother    Diabetes Paternal Grandmother     Kidney failure Mother    Diabetes Mother    Other Brother        crohn's disease   Hyperlipidemia Brother    Fibromyalgia Daughter    Diabetes Maternal Grandmother    Healthy Son    Colon cancer Maternal Uncle    Colon polyps Neg Hx     Social History:  Social History   Tobacco Use   Smoking status: Former    Packs/day: 0.50    Years: 31.00    Total pack years: 15.50    Types: E-cigarettes, Cigarettes    Quit date: 07/23/2019    Years since quitting: 2.5   Smokeless tobacco: Never   Tobacco comments:    using vape at times  Vaping Use   Vaping Use: Some days   Substances: Nicotine-salt  Substance Use Topics   Alcohol use: Yes    Alcohol/week: 0.0 standard drinks of alcohol    Comment: occasional social use    Drug use: No    Review of symptoms:  Constitutional:  Negative for unexplained weight loss, night sweats, fever, chills ENT:  Negative for nose bleeds, sinus pain, painful swallowing CV:  Negative for chest pain, shortness of breath, exercise intolerance, palpitations, loss of consciousness Resp:  Negative for cough, wheezing, shortness of breath GI:  Negative for nausea, vomiting, diarrhea, bloody stools GU:  Positives noted in HPI; otherwise negative for gross hematuria, dysuria, urinary incontinence Neuro:  Negative for seizures, poor balance, limb weakness, slurred speech Psych:  Negative for lack of energy, depression, anxiety Endocrine:  Negative for polydipsia, polyuria, symptoms of hypoglycemia (dizziness, hunger, sweating) Hematologic:  Negative for anemia, purpura, petechia, prolonged or excessive bleeding, use of anticoagulants  Allergic:  Negative for difficulty breathing or choking as a result of exposure to anything; no shellfish allergy; no allergic response (rash/itch) to materials, foods  Physical exam: There were no vitals taken for this visit. GENERAL APPEARANCE:  Well appearing, well developed, well nourished, NAD HEENT: Atraumatic,  Normocephalic, oropharynx clear. NECK: Supple without lymphadenopathy or thyromegaly. LUNGS: Clear to auscultation bilaterally. HEART: Regular Rate and Rhythm without murmurs, gallops, or rubs. ABDOMEN: Soft, non-tender, No Masses. EXTREMITIES: Moves all extremities well.  Without clubbing, cyanosis, or edema. NEUROLOGIC:  Alert and oriented x 3, normal gait, CN II-XII grossly intact.  MENTAL STATUS:  Appropriate. BACK:  Non-tender to palpation.  No CVAT SKIN:  Warm, dry and intact.    Results: U/A:

## 2022-02-27 ENCOUNTER — Ambulatory Visit (HOSPITAL_COMMUNITY)
Admission: RE | Admit: 2022-02-27 | Discharge: 2022-02-27 | Disposition: A | Discharge status: Home / Self Care | Payer: Medicare Other | Source: Ambulatory Visit | Attending: Gastroenterology | Admitting: Gastroenterology

## 2022-02-27 DIAGNOSIS — R911 Solitary pulmonary nodule: Secondary | ICD-10-CM | POA: Diagnosis present

## 2022-02-27 MED ORDER — IOHEXOL 300 MG/ML  SOLN
75.0000 mL | Freq: Once | INTRAMUSCULAR | Status: AC | PRN
  Administered 2022-02-27: 75 mL via INTRAVENOUS

## 2022-03-02 ENCOUNTER — Encounter: Payer: Self-pay | Admitting: Emergency Medicine

## 2022-03-02 ENCOUNTER — Ambulatory Visit (INDEPENDENT_AMBULATORY_CARE_PROVIDER_SITE_OTHER): Payer: Medicare Other | Admitting: Emergency Medicine

## 2022-03-02 VITALS — BP 134/78 | HR 99 | Temp 98.7°F | Ht 62.5 in | Wt 173.8 lb

## 2022-03-02 DIAGNOSIS — R042 Hemoptysis: Secondary | ICD-10-CM

## 2022-03-02 DIAGNOSIS — R9389 Abnormal findings on diagnostic imaging of other specified body structures: Secondary | ICD-10-CM

## 2022-03-02 DIAGNOSIS — J342 Deviated nasal septum: Secondary | ICD-10-CM

## 2022-03-02 NOTE — Patient Instructions (Addendum)
We will plan to repeat your CT scan of the chest without contrast in February 2024 to follow small pulmonary nodule We will refer you to see ENT to evaluate deviated septum and suspected nasopharyngeal blood Follow Dr. Lamonte Sakai in February after your CT scan so we can review the results together

## 2022-03-02 NOTE — Progress Notes (Signed)
Subjective:    Patient ID: Susan Baker, female    DOB: 05/11/1961, 61 y.o.   MRN: [id number]  HPI 61 year old former smoker (30 pack years, still vapes) with a history of fibromyalgia, GERD with PUD, DDD, depression/anxiety, uterine cancer that was treated with hysterectomy in She is referred today for evaluation of an abnormal CT scan of the chest. She had been having cough and hemoptysis with some associated left anterior chest discomfort. Began in April, can come and go, a dull ache but can be sharp. Can be worse based on position. She saw some blood in mucous in April, was treated w abx. No nasal blood, dental blood. Happening qod currently. No clots or frank blood. She has a hx of PUD and GIB's - she is unsure whether the blood is coming from chest or stomach, but seems to be her chest. She has sore throat.   She describes.  Prompted CT chest, CT chest 11/29/[facility] as below.  CT chest 11/29/2021 reviewed by me, shows no mediastinal or hilar adenopathy, lateral right middle lobe subpleural reticulation with some adjacent groundglass without any discrete nodularity.  There is some bandlike subsegmental atelectasis in the anterior right upper and middle lobes.  There is also a solid 3 mm right middle lobe pulmonary nodule  CT abd/pelvis [facility] 04/14/21 report >> multiple GG nodules in R lung, largest 41m   ROV 03/02/2022 --61year old woman, former smoker with a history of uterine cancer treated by hysterectomy (remote).  She was experiencing scant hemoptysis and had an abnormal CT scan of the chest that showed some lateral right middle lobe subpleural groundglass change without any discrete nodularity as well as a 3 mm right middle lobe nodule.  She underwent bronchoscopy on 7/27 with negative cytology on BAL, brushings and biopsies.  Unfortunately the culture data was never sent.  She underwent a repeat CT chest as below. She still sees some blood occasionally when she coughs. She has not  seen blood from her nose but believes she may taste it.    CT scan of the chest done 02/27/2022 reviewed by me showed some resolution and less prominent right middle lobe peripheral change, question scar without any overt groundglass.  The 3 mm nodule is stable in size and appearance.   Review of Systems As per HPI  Past Medical History:  Diagnosis Date   Abnormal Pap smear    Abnormal ThinPrep Pap test of vagina 04/25/2017   LSIL will get colp__________   Anxiety    Bulging disc    cervical, [facility]   [place]    Depression 02/18/2013   Fibromyalgia    GERD (gastroesophageal reflux disease)    egd with RE per patient, remote   Hematuria 05/14/2014   History of abnormal cervical Pap smear 02/24/2014   Hypertension    Osteoarthritis (arthritis due to wear and tear of joints)    Osteoporosis    Osteoporosis, unspecified 03/03/2014   Postcoital bleeding 09/15/2015   Postmenopausal 02/24/2014   PUD (peptic ulcer disease)    bleeding PUD per patient, remote, On Daypro   Urinary frequency 05/14/2014   [place]    Vaginal Pap smear, abnormal      Family History  Problem Relation Age of Onset   Cirrhosis Father        etoh, died with liver cancer   Diabetes Father    Cirrhosis Paternal Uncle  multiple, etoh   Pancreatic cancer Paternal Grandmother    Diabetes Paternal Grandmother    Kidney failure Mother    Diabetes Mother    Other Brother        crohn's disease   Hyperlipidemia Brother    Fibromyalgia Daughter    Diabetes Maternal Grandmother    Healthy Son    Colon cancer Maternal Uncle    Colon polyps Neg Hx      Social History   Socioeconomic History   Marital status: Divorced    Spouse name: Not on file   Number of children: 2   Years of education: Not on file   Highest education level: Not on file  Occupational History   Occupation: disability  Tobacco Use   Smoking status: Former     Packs/day: 0.50    Years: 31.00    Total pack years: 15.50    Types: E-cigarettes, Cigarettes    Quit date: 07/23/2019    Years since quitting: 2.6   Smokeless tobacco: Never   Tobacco comments:    using vape at times  Vaping Use   Vaping Use: Some days   Substances: Nicotine-salt  Substance and Sexual Activity   Alcohol use: Yes    Alcohol/week: 0.0 standard drinks of alcohol    Comment: occasional social use    Drug use: No   Sexual activity: Not Currently    Birth control/protection: Surgical    Comment: hysterectomy  Other Topics Concern   Not on file  Social History Narrative   Not on file   Social Determinants of Health   Financial Resource Strain: Not on file  Food Insecurity: No Food Insecurity (12/22/2019)   Hunger Vital Sign    Worried About Running Out of Food in the Last Year: Never true    Ran Out of Food in the Last Year: Never true  Transportation Needs: No Transportation Needs (12/22/2019)   PRAPARE - Hydrologist (Medical): No    Lack of Transportation (Non-Medical): No  Physical Activity: Sufficiently Active (12/22/2019)   Exercise Vital Sign    Days of Exercise per Week: 5 days    Minutes of Exercise per Session: 30 min  Stress: Stress Concern Present (12/22/2019)   [place]    Feeling of Stress : Rather much  Social Connections: Moderately Isolated (12/22/2019)   Social Connection and Isolation Panel [NHANES]    Frequency of Communication with Friends and Family: More than three times a week    Frequency of Social Gatherings with Friends and Family: Twice a week    Attends Religious Services: More than 4 times per year    Active Member of [employer] or Organizations: No    Attends Archivist Meetings: Never    Marital Status: Divorced  Human resources officer Violence: Not At Risk (12/22/2019)   Humiliation, Afraid, Rape, and Kick questionnaire    Fear of  Current or Ex-Partner: No    Emotionally Abused: No    Physically Abused: No    Sexually Abused: No     Allergies  Allergen Reactions   Cymbalta [Duloxetine Hcl] Other (See Comments)    Depression, anxiety, wt gain   Daypro [Oxaprozin] Other (See Comments)    Ulcers   Sulfa Antibiotics Other (See Comments)    Bactrim-Could not function   Trazodone And Nefazodone Other (See Comments)    Could not function   Nucynta [Tapentadol] Other (See Comments)  Memory loss and slurred speech     Outpatient Medications Prior to Visit  Medication Sig Dispense Refill   FLUoxetine (PROZAC) 40 MG capsule Take 40 mg by mouth daily.     HYDROcodone-acetaminophen (NORCO/VICODIN) 5-325 MG tablet Take 1 tablet by mouth every 4 (four) hours as needed for moderate pain. Take 1 tablet 4 times daily     lisinopril (ZESTRIL) 10 MG tablet Take 10 mg by mouth daily.     methocarbamol (ROBAXIN) 500 MG tablet Take 500 mg by mouth every 8 (eight) hours as needed for muscle spasms.     ondansetron (ZOFRAN-ODT) 4 MG disintegrating tablet Take 1 tablet (4 mg total) by mouth every 8 (eight) hours as needed for nausea or vomiting. 30 tablet 1   pantoprazole (PROTONIX) 40 MG tablet Take 1 tablet (40 mg total) by mouth 2 (two) times daily before a meal. 60 tablet 3   pregabalin (LYRICA) 150 MG capsule Take 150 mg by mouth 2 (two) times daily.     Tiotropium Bromide Monohydrate 1.25 MCG/ACT AERS Inhale 1.25 mcg into the lungs daily as needed (Breathing issues).     VENTOLIN HFA 108 (90 Base) MCG/ACT inhaler Inhale 1 puff into the lungs every 4 (four) hours as needed for shortness of breath.     No facility-administered medications prior to visit.         Objective:   Physical Exam Vitals:   03/02/22 1548  BP: 134/78  Pulse: 99  Temp: 98.7 F (37.1 C)  TempSrc: Oral  SpO2: 99%  Weight: 173 lb 12.8 oz (78.8 kg)  Height: 5' 2.5" (1.588 m)   Gen: Pleasant, well-nourished, in no distress,  normal  affect  ENT: No lesions,  mouth clear,  oropharynx clear, no postnasal drip.  Her septum in the left nare looks raw, erythematous with some dried blood  Neck: No JVD, no stridor  Lungs: No use of accessory muscles, no crackles or wheezing on normal respiration, no wheeze on forced expiration  Cardiovascular: RRR, heart sounds normal, no murmur or gallops, no peripheral edema  Musculoskeletal: No deformities, no cyanosis or clubbing  Neuro: alert, awake, non focal  Skin: Warm, no lesions or rash      Assessment & Plan:  Abnormal CT of the chest The peripheral interstitial prominence in the right middle lobe is improved.  No groundglass.  There is a discrete 3 mm right middle lobe nodule that is stable in size and appearance.  Would not be unreasonable to stop checking CT scans given the small size of this nodule but given the hemoptysis, tobacco history I think we should continue to follow.  I will repeat her CT in 6 months  Hemoptysis Reassuring bronchoscopy without any evidence of bleeding.  I am more suspicious now that she is having nasopharyngeal blood that drains to her posterior pharynx in the night and then she coughs it up in the morning.  She has a deviated septum as well.  I believe I see some blood in the left nare on exam today.  I will send her to ENT to evaluate further.   Baltazar Apo, MD, PhD 03/02/2022, 4:09 PM [facility] [phone] or if no answer before 7:00PM call [phone] For any issues after 7:00PM please call eLink [phone]

## 2022-03-02 NOTE — Addendum Note (Signed)
Addended by: Gavin Potters R on: 03/02/2022 04:16 PM   Modules accepted: Orders

## 2022-03-02 NOTE — Assessment & Plan Note (Signed)
Reassuring bronchoscopy without any evidence of bleeding.  I am more suspicious now that she is having nasopharyngeal blood that drains to her posterior pharynx in the night and then she coughs it up in the morning.  She has a deviated septum as well.  I believe I see some blood in the left nare on exam today.  I will send her to ENT to evaluate further.

## 2022-03-02 NOTE — Assessment & Plan Note (Signed)
The peripheral interstitial prominence in the right middle lobe is improved.  No groundglass.  There is a discrete 3 mm right middle lobe nodule that is stable in size and appearance.  Would not be unreasonable to stop checking CT scans given the small size of this nodule but given the hemoptysis, tobacco history I think we should continue to follow.  I will repeat her CT in 6 months

## 2022-06-02 ENCOUNTER — Encounter: Payer: Self-pay | Admitting: Internal Medicine

## 2022-06-19 ENCOUNTER — Telehealth: Payer: Self-pay | Admitting: Emergency Medicine

## 2022-06-19 NOTE — Telephone Encounter (Signed)
FYI: Patient called in regards to CT scan in Feb- patient requested a call with results, denied scheduling an appt after Feb CT scan with RB. Patient had concerns of labs that were ordered by Dr. Kerin Perna after bronch. Pt is still having chest pains and coughing up blood. Scheduled an appt with Rb on 12/21 at 2:45pm.

## 2022-06-20 NOTE — Telephone Encounter (Signed)
I called and spoke with the pt  She states that she wants to keep appt for thurs  The hemoptysis and chest discomfort come and go and have been present for "a long time"  She is not having any new symptoms  She is aware results and questions can be discussed at visit  I urged her to seek emergent care should her symptoms persist/worsen

## 2022-06-22 ENCOUNTER — Encounter: Payer: Self-pay | Admitting: Emergency Medicine

## 2022-06-22 ENCOUNTER — Ambulatory Visit (INDEPENDENT_AMBULATORY_CARE_PROVIDER_SITE_OTHER): Payer: Medicare Other | Admitting: Emergency Medicine

## 2022-06-22 VITALS — BP 138/76 | HR 95 | Temp 98.2°F | Ht 62.5 in | Wt 167.8 lb

## 2022-06-22 DIAGNOSIS — R9389 Abnormal findings on diagnostic imaging of other specified body structures: Secondary | ICD-10-CM

## 2022-06-22 DIAGNOSIS — R042 Hemoptysis: Secondary | ICD-10-CM

## 2022-06-22 DIAGNOSIS — D649 Anemia, unspecified: Secondary | ICD-10-CM | POA: Diagnosis not present

## 2022-06-22 NOTE — Progress Notes (Signed)
Subjective:    Patient ID: Susan Baker, female    DOB: 1961/03/10, 61 y.o.   MRN: [id number]  HPI  ROV 03/02/2022 --61 year old woman, former smoker with a history of uterine cancer treated by hysterectomy (remote).  She was experiencing scant hemoptysis and had an abnormal CT scan of the chest that showed some lateral right middle lobe subpleural groundglass change without any discrete nodularity as well as a 3 mm right middle lobe nodule.  She underwent bronchoscopy on 7/27 with negative cytology on BAL, brushings and biopsies.  Unfortunately the culture data was never sent.  She underwent a repeat CT chest as below. She still sees some blood occasionally when she coughs. She has not seen blood from her nose but believes she may taste it.   CT scan of the chest done 02/27/2022 reviewed by me showed some resolution and less prominent right middle lobe peripheral change, question scar without any overt groundglass.  The 3 mm nodule is stable in size and appearance.  ROV 06/22/22 --follow-up visit for 61 year old woman with history of former tobacco use (30 pack years), active vape use.  We have been following intermittent cough and scant hemoptysis, question of possible contribution of epistaxis as she has a deviated septum although unclear.  The evaluation is included serial CT chest with some right middle lobe inflammatory change, subpleural reticulation and a few small (3 mm) right middle lobe nodules.  The inflammatory changes were actually resolved on her CT 02/27/2022.  Bronchoscopy 7/27 showed reassuring cytologies.  Had been concerned that her cultures had not been sent but culture data is available: BAL was with 97 WBC, 49% monocytes.  Bacterial culture is negative, but the AFB and fungal smears were not done.  She is still finding some blood intermittently in her mucous, not really coughing it up, sometimes just is in the back of her throat. She is not coughing frequently. She is having some  mid chest pain, has been seen by Cards in [place] with reassuring results. She still gets some GERD sx on PPI bid.     Review of Systems As per HPI  Past Medical History:  Diagnosis Date   Abnormal Pap smear    Abnormal ThinPrep Pap test of vagina 04/25/2017   LSIL will get colp__________   Anxiety    Bulging disc    cervical, [facility]   COPD (chronic obstructive pulmonary disease) ([place])    Depression 02/18/2013   Fibromyalgia    GERD (gastroesophageal reflux disease)    egd with RE per patient, remote   Hematuria 05/14/2014   History of abnormal cervical Pap smear 02/24/2014   Hypertension    Osteoarthritis (arthritis due to wear and tear of joints)    Osteoporosis    Osteoporosis, unspecified 03/03/2014   Postcoital bleeding 09/15/2015   Postmenopausal 02/24/2014   PUD (peptic ulcer disease)    bleeding PUD per patient, remote, On Daypro   Urinary frequency 05/14/2014   Uterine cancer ([place])    Vaginal Pap smear, abnormal      Family History  Problem Relation Age of Onset   Cirrhosis Father        etoh, died with liver cancer   Diabetes Father    Cirrhosis Paternal Uncle        multiple, etoh   Pancreatic cancer Paternal Grandmother    Diabetes Paternal Grandmother    Kidney failure Mother    Diabetes Mother    Other Brother  crohn's disease   Hyperlipidemia Brother    Fibromyalgia Daughter    Diabetes Maternal Grandmother    Healthy Son    Colon cancer Maternal Uncle    Colon polyps Neg Hx      Social History   Socioeconomic History   Marital status: Divorced    Spouse name: Not on file   Number of children: 2   Years of education: Not on file   Highest education level: Not on file  Occupational History   Occupation: disability  Tobacco Use   Smoking status: Former    Packs/day: 0.50    Years: 31.00    Total pack years: 15.50    Types: E-cigarettes, Cigarettes    Quit date: 07/23/2019    Years since quitting: 2.9   Smokeless  tobacco: Never   Tobacco comments:    using vape at times  Vaping Use   Vaping Use: Some days   Substances: Nicotine-salt  Substance and Sexual Activity   Alcohol use: Yes    Alcohol/week: 0.0 standard drinks of alcohol    Comment: occasional social use    Drug use: No   Sexual activity: Not Currently    Birth control/protection: Surgical    Comment: hysterectomy  Other Topics Concern   Not on file  Social History Narrative   Not on file   Social Determinants of Health   Financial Resource Strain: Not on file  Food Insecurity: No Food Insecurity (12/22/2019)   Hunger Vital Sign    Worried About Running Out of Food in the Last Year: Never true    Ran Out of Food in the Last Year: Never true  Transportation Needs: No Transportation Needs (12/22/2019)   PRAPARE - Hydrologist (Medical): No    Lack of Transportation (Non-Medical): No  Physical Activity: Sufficiently Active (12/22/2019)   Exercise Vital Sign    Days of Exercise per Week: 5 days    Minutes of Exercise per Session: 30 min  Stress: Stress Concern Present (12/22/2019)   [place]    Feeling of Stress : Rather much  Social Connections: Moderately Isolated (12/22/2019)   Social Connection and Isolation Panel [NHANES]    Frequency of Communication with Friends and Family: More than three times a week    Frequency of Social Gatherings with Friends and Family: Twice a week    Attends Religious Services: More than 4 times per year    Active Member of [employer] or Organizations: No    Attends Archivist Meetings: Never    Marital Status: Divorced  Human resources officer Violence: Not At Risk (12/22/2019)   Humiliation, Afraid, Rape, and Kick questionnaire    Fear of Current or Ex-Partner: No    Emotionally Abused: No    Physically Abused: No    Sexually Abused: No     Allergies  Allergen Reactions   Cymbalta [Duloxetine  Hcl] Other (See Comments)    Depression, anxiety, wt gain   Daypro [Oxaprozin] Other (See Comments)    Ulcers   Sulfa Antibiotics Other (See Comments)    Bactrim-Could not function   Trazodone And Nefazodone Other (See Comments)    Could not function   Nucynta [Tapentadol] Other (See Comments)    Memory loss and slurred speech     Outpatient Medications Prior to Visit  Medication Sig Dispense Refill   FLUoxetine (PROZAC) 40 MG capsule Take 40 mg by mouth daily.  HYDROcodone-acetaminophen (NORCO/VICODIN) 5-325 MG tablet Take 1 tablet by mouth every 4 (four) hours as needed for moderate pain. Take 1 tablet 4 times daily     lisinopril (ZESTRIL) 10 MG tablet Take 10 mg by mouth daily.     methocarbamol (ROBAXIN) 500 MG tablet Take 500 mg by mouth every 8 (eight) hours as needed for muscle spasms.     ondansetron (ZOFRAN-ODT) 4 MG disintegrating tablet Take 1 tablet (4 mg total) by mouth every 8 (eight) hours as needed for nausea or vomiting. 30 tablet 1   pantoprazole (PROTONIX) 40 MG tablet Take 1 tablet (40 mg total) by mouth 2 (two) times daily before a meal. 60 tablet 3   pregabalin (LYRICA) 150 MG capsule Take 150 mg by mouth 2 (two) times daily.     Tiotropium Bromide Monohydrate 1.25 MCG/ACT AERS Inhale 1.25 mcg into the lungs daily as needed (Breathing issues).     VENTOLIN HFA 108 (90 Base) MCG/ACT inhaler Inhale 1 puff into the lungs every 4 (four) hours as needed for shortness of breath.     No facility-administered medications prior to visit.         Objective:   Physical Exam Vitals:   06/22/22 1441  BP: 138/76  Pulse: 95  Temp: 98.2 F (36.8 C)  TempSrc: Oral  SpO2: 98%  Weight: 167 lb 12.8 oz (76.1 kg)  Height: 5' 2.5" (1.588 m)   Gen: Pleasant, well-nourished, in no distress,  normal affect  ENT: No lesions,  mouth clear,  oropharynx clear, no postnasal drip.  Her septum in the left nare looks raw, erythematous with some dried blood  Neck: No JVD, no  stridor  Lungs: No use of accessory muscles, no crackles or wheezing on normal respiration, no wheeze on forced expiration  Cardiovascular: RRR, heart sounds normal, no murmur or gallops, no peripheral edema  Musculoskeletal: No deformities, no cyanosis or clubbing  Neuro: alert, awake, non focal  Skin: Warm, no lesions or rash      Assessment & Plan:  Hemoptysis Will sometimes seeing blood mixed with mucus that she can clear from her throat.  Interestingly it is not always coughed up from deeper in her chest.  She does not believe it is draining down from her sinuses although there has been some irritation seen at her deviated septum when evaluated by ENT.  She has had a reassuring EGD by Dr. Gala Romney.  She has had a reassuring airway inspection on our bronchoscopy 01/26/2022.  Bacterial cultures and cytologies are all negative and her CT chest has shown clearance of her right middle lobe subtle infiltrate.  AFB and fungal cultures were not sent on the BAL fluid and may still be relevant depending on how her next CT chest looks.  If there is recurrence of inflammatory infiltrate then I think a repeat bronchoscopy may be needed to obtain definitive culture data, AFB, fungal.  Her next CT is in February.  She did mention her labs that were done preoperatively 7/27 which notes some relative anemia with a drop in her hemoglobin from 14-> 11.  Question whether this may be due to her underlying liver disease.  I will order screening anemia labs.  If abnormal then will discuss with her primary care physician.  Abnormal CT of the chest Subtle peripheral right middle lobe infiltrate cleared, residual 3 mm nodule remains.  We are following a repeat scan in February 2024  Time spent 46 minutes  Baltazar Apo, MD, PhD 06/22/2022, 3:28  PM [facility] [phone] or if no answer before 7:00PM call [phone] For any issues after 7:00PM please call eLink [phone]

## 2022-06-22 NOTE — Assessment & Plan Note (Signed)
Will sometimes seeing blood mixed with mucus that she can clear from her throat.  Interestingly it is not always coughed up from deeper in her chest.  She does not believe it is draining down from her sinuses although there has been some irritation seen at her deviated septum when evaluated by ENT.  She has had a reassuring EGD by Dr. Gala Romney.  She has had a reassuring airway inspection on our bronchoscopy 01/26/2022.  Bacterial cultures and cytologies are all negative and her CT chest has shown clearance of her right middle lobe subtle infiltrate.  AFB and fungal cultures were not sent on the BAL fluid and may still be relevant depending on how her next CT chest looks.  If there is recurrence of inflammatory infiltrate then I think a repeat bronchoscopy may be needed to obtain definitive culture data, AFB, fungal.  Her next CT is in February.  She did mention her labs that were done preoperatively 7/27 which notes some relative anemia with a drop in her hemoglobin from 14-> 11.  Question whether this may be due to her underlying liver disease.  I will order screening anemia labs.  If abnormal then will discuss with her primary care physician.

## 2022-06-22 NOTE — Assessment & Plan Note (Signed)
Subtle peripheral right middle lobe infiltrate cleared, residual 3 mm nodule remains.  We are following a repeat scan in February 2024

## 2022-06-22 NOTE — Patient Instructions (Addendum)
We will repeat your CT scan of the chest in February as planned.  Depending on this result we will decide if any other testing including possible repeat bronchoscopy would be helpful. We will send lab work today to begin to evaluate anemia.  Please discuss these labs with your primary physicians in [facility], increase back to twice a day.  Try to avoid eating after 8 PM, avoid spicy foods or foods that contribute to reflux. Follow Dr. Lamonte Sakai in February after your CT so we can review the results together.

## 2022-06-23 LAB — CBC WITH DIFFERENTIAL/PLATELET
Basophils Absolute: 0.1 10*3/uL (ref 0.0–0.1)
Basophils Relative: 1.2 % (ref 0.0–3.0)
Eosinophils Absolute: 0.3 10*3/uL (ref 0.0–0.7)
Eosinophils Relative: 4 % (ref 0.0–5.0)
HCT: 40.5 % (ref 36.0–46.0)
Hemoglobin: 13.9 g/dL (ref 12.0–15.0)
Lymphocytes Relative: 34.3 % (ref 12.0–46.0)
Lymphs Abs: 2.4 10*3/uL (ref 0.7–4.0)
MCHC: 34.3 g/dL (ref 30.0–36.0)
MCV: 89.9 fl (ref 78.0–100.0)
Monocytes Absolute: 0.5 10*3/uL (ref 0.1–1.0)
Monocytes Relative: 7.3 % (ref 3.0–12.0)
Neutro Abs: 3.7 10*3/uL (ref 1.4–7.7)
Neutrophils Relative %: 53.2 % (ref 43.0–77.0)
Platelets: 251 10*3/uL (ref 150.0–400.0)
RBC: 4.51 Mil/uL (ref 3.87–5.11)
RDW: 12.4 % (ref 11.5–15.5)
WBC: 7 10*3/uL (ref 4.0–10.5)

## 2022-06-23 LAB — COMPREHENSIVE METABOLIC PANEL
ALT: 26 U/L (ref 0–35)
AST: 22 U/L (ref 0–37)
Albumin: 4.7 g/dL (ref 3.5–5.2)
Alkaline Phosphatase: 69 U/L (ref 39–117)
BUN: 11 mg/dL (ref 6–23)
CO2: 31 mEq/L (ref 19–32)
Calcium: 10.2 mg/dL (ref 8.4–10.5)
Chloride: 101 mEq/L (ref 96–112)
Creatinine, Ser: 1.01 mg/dL (ref 0.40–1.20)
GFR: 60.47 mL/min (ref >60.00)
Glucose, Bld: 83 mg/dL (ref 70–99)
Potassium: 4.3 mEq/L (ref 3.5–5.1)
Sodium: 141 mEq/L (ref 135–145)
Total Bilirubin: 0.5 mg/dL (ref 0.2–1.2)
Total Protein: 7.1 g/dL (ref 6.0–8.3)

## 2022-06-23 LAB — PATHOLOGIST SMEAR REVIEW

## 2022-06-23 LAB — LACTATE DEHYDROGENASE: LDH: 201 U/L (ref 120–250)

## 2022-06-23 LAB — HAPTOGLOBIN: Haptoglobin: 120 mg/dL (ref 43–212)

## 2022-06-24 LAB — IBC + FERRITIN
Ferritin: 61.1 ng/mL (ref 10.0–291.0)
Iron: 106 ug/dL (ref 42–145)
Saturation Ratios: 28.6 % (ref 20.0–50.0)
TIBC: 371 ug/dL (ref 250.0–450.0)
Transferrin: 265 mg/dL (ref 212.0–360.0)

## 2022-06-24 LAB — VITAMIN B12: Vitamin B-12: 352 pg/mL (ref 211–911)

## 2022-06-29 ENCOUNTER — Emergency Department (HOSPITAL_COMMUNITY): Payer: Medicare Other

## 2022-06-29 ENCOUNTER — Other Ambulatory Visit: Payer: Self-pay

## 2022-06-29 ENCOUNTER — Encounter (HOSPITAL_COMMUNITY): Payer: Self-pay

## 2022-06-29 ENCOUNTER — Emergency Department (HOSPITAL_COMMUNITY)
Admission: EM | Admit: 2022-06-29 | Discharge: 2022-06-30 | Disposition: A | Discharge status: Home / Self Care | Payer: Medicare Other | Attending: Emergency Medicine | Admitting: Emergency Medicine

## 2022-06-29 DIAGNOSIS — J101 Influenza due to other identified influenza virus with other respiratory manifestations: Secondary | ICD-10-CM | POA: Diagnosis not present

## 2022-06-29 DIAGNOSIS — R059 Cough, unspecified: Secondary | ICD-10-CM | POA: Diagnosis present

## 2022-06-29 DIAGNOSIS — Z8542 Personal history of malignant neoplasm of other parts of uterus: Secondary | ICD-10-CM | POA: Insufficient documentation

## 2022-06-29 DIAGNOSIS — Z79899 Other long term (current) drug therapy: Secondary | ICD-10-CM | POA: Diagnosis not present

## 2022-06-29 DIAGNOSIS — J449 Chronic obstructive pulmonary disease, unspecified: Secondary | ICD-10-CM | POA: Insufficient documentation

## 2022-06-29 DIAGNOSIS — Z87891 Personal history of nicotine dependence: Secondary | ICD-10-CM | POA: Insufficient documentation

## 2022-06-29 DIAGNOSIS — I10 Essential (primary) hypertension: Secondary | ICD-10-CM | POA: Insufficient documentation

## 2022-06-29 DIAGNOSIS — Z20822 Contact with and (suspected) exposure to covid-19: Secondary | ICD-10-CM | POA: Diagnosis not present

## 2022-06-29 DIAGNOSIS — Z7951 Long term (current) use of inhaled steroids: Secondary | ICD-10-CM | POA: Diagnosis not present

## 2022-06-29 LAB — BASIC METABOLIC PANEL
Anion gap: 8 (ref 5–15)
BUN: 13 mg/dL (ref 6–20)
CO2: 27 mmol/L (ref 22–32)
Calcium: 9.1 mg/dL (ref 8.9–10.3)
Chloride: 105 mmol/L (ref 98–111)
Creatinine, Ser: 1.18 mg/dL — ABNORMAL HIGH (ref 0.44–1.00)
GFR, Estimated: 53 mL/min — ABNORMAL LOW (ref >60)
Glucose, Bld: 97 mg/dL (ref 70–99)
Potassium: 4.1 mmol/L (ref 3.5–5.1)
Sodium: 140 mmol/L (ref 135–145)

## 2022-06-29 LAB — CBC
HCT: 35.2 % — ABNORMAL LOW (ref 36.0–46.0)
Hemoglobin: 12 g/dL (ref 12.0–15.0)
MCH: 31.2 pg (ref 26.0–34.0)
MCHC: 34.1 g/dL (ref 30.0–36.0)
MCV: 91.4 fL (ref 80.0–100.0)
Platelets: 208 10*3/uL (ref 150–400)
RBC: 3.85 MIL/uL — ABNORMAL LOW (ref 3.87–5.11)
RDW: 12 % (ref 11.5–15.5)
WBC: 5.2 10*3/uL (ref 4.0–10.5)
nRBC: 0 % (ref 0.0–0.2)

## 2022-06-29 LAB — RESP PANEL BY RT-PCR (RSV, FLU A&B, COVID)  RVPGX2
Influenza A by PCR: POSITIVE — AB
Influenza B by PCR: NEGATIVE
Resp Syncytial Virus by PCR: NEGATIVE
SARS Coronavirus 2 by RT PCR: NEGATIVE

## 2022-06-29 MED ORDER — ALBUTEROL SULFATE HFA 108 (90 BASE) MCG/ACT IN AERS
2.0000 | INHALATION_SPRAY | RESPIRATORY_TRACT | Status: DC | PRN
  Administered 2022-06-30: 2 via RESPIRATORY_TRACT
  Filled 2022-06-29 (×2): qty 6.7

## 2022-06-29 NOTE — ED Triage Notes (Signed)
Pt arrived from home via POV w c/o being sick x 1 week with congestion, wheezing, sore throat, headache, cough, fatigue, fever. Now coughing up blood.

## 2022-06-30 NOTE — ED Provider Notes (Signed)
[id number] [facility] EMERGENCY DEPARTMENT Provider Note   CSN: [id number] Arrival date & time: 06/29/22  4818     History  Chief Complaint  Patient presents with   Cough    Susan Baker is a 61 y.o. female.  The history is provided by the patient.  Cough Severity:  Moderate Onset quality:  Gradual Timing:  Intermittent Progression:  Worsening Chronicity:  New Smoker: no    Patient presents with flulike illness.  She reports for the past week she has felt sick.  She reports congestion, wheezing, sore throat and cough.  She reports fatigue.  No fevers.  Has some chest pain with coughing.  No vomiting.  She is a former smoker.  She reports significant history of chronic hemoptysis that is followed by pulmonology.  This is unchanged.   Past Medical History:  Diagnosis Date   Abnormal Pap smear    Abnormal ThinPrep Pap test of vagina 04/25/2017   LSIL will get colp__________   Anxiety    Bulging disc    cervical, [facility]   COPD (chronic obstructive pulmonary disease) ([place])    Depression 02/18/2013   Fibromyalgia    GERD (gastroesophageal reflux disease)    egd with RE per patient, remote   Hematuria 05/14/2014   History of abnormal cervical Pap smear 02/24/2014   Hypertension    Osteoarthritis (arthritis due to wear and tear of joints)    Osteoporosis    Osteoporosis, unspecified 03/03/2014   Postcoital bleeding 09/15/2015   Postmenopausal 02/24/2014   PUD (peptic ulcer disease)    bleeding PUD per patient, remote, On Daypro   Urinary frequency 05/14/2014   Uterine cancer ([place])    Vaginal Pap smear, abnormal     Home Medications Prior to Admission medications   Medication Sig Start Date End Date Taking? Authorizing Provider  FLUoxetine (PROZAC) 40 MG capsule Take 40 mg by mouth daily.    [provider]  HYDROcodone-acetaminophen (NORCO/VICODIN) 5-325 MG tablet Take 1 tablet by mouth every 4 (four) hours as needed for moderate pain. Take 1 tablet 4 times  daily    [provider]  lisinopril (ZESTRIL) 10 MG tablet Take 10 mg by mouth daily.    [provider]  methocarbamol (ROBAXIN) 500 MG tablet Take 500 mg by mouth every 8 (eight) hours as needed for muscle spasms.    [provider]  ondansetron (ZOFRAN-ODT) 4 MG disintegrating tablet Take 1 tablet (4 mg total) by mouth every 8 (eight) hours as needed for nausea or vomiting. 11/22/21   Annitta Needs, NP  pantoprazole (PROTONIX) 40 MG tablet Take 1 tablet (40 mg total) by mouth 2 (two) times daily before a meal. 11/24/21   Annitta Needs, NP  pregabalin (LYRICA) 150 MG capsule Take 150 mg by mouth 2 (two) times daily. 03/19/19   [provider]  Tiotropium Bromide Monohydrate 1.25 MCG/ACT AERS Inhale 1.25 mcg into the lungs daily as needed (Breathing issues).    [provider]  VENTOLIN HFA 108 (90 Base) MCG/ACT inhaler Inhale 1 puff into the lungs every 4 (four) hours as needed for shortness of breath. 11/10/21   [provider]      Allergies    Cymbalta [duloxetine hcl], Daypro [oxaprozin], Sulfa antibiotics, Trazodone and nefazodone, and Nucynta [tapentadol]    Review of Systems   Review of Systems  Respiratory:  Positive for cough.     Physical Exam Updated Vital Signs BP 106/70   Pulse 73  Temp 98.7 F (37.1 C) (Oral)   Resp 19   Ht 1.588 m (5' 2.5")   Wt 77.1 kg   SpO2 97%   BMI 30.60 kg/m  Physical Exam CONSTITUTIONAL: Well developed/well nourished, no distress HEAD: Normocephalic/atraumatic EYES: EOMI/PERRL ENMT: Mucous membranes moist, uvula midline, no active bleeding in the oropharynx.  No stridor NECK: supple no meningeal signs CV: S1/S2 noted, no murmurs/rubs/gallops noted LUNGS: Lungs are clear to auscultation bilaterally, no apparent distress ABDOMEN: soft, nontender NEURO: Pt is awake/alert/appropriate, moves all extremitiesx4.  No facial droop.   EXTREMITIES: pulses normal/equal, full ROM SKIN: warm, color  normal PSYCH: no abnormalities of mood noted, alert and oriented to situation  ED Results / Procedures / Treatments   Labs (all labs ordered are listed, but only abnormal results are displayed) Labs Reviewed  RESP PANEL BY RT-PCR (RSV, FLU A&B, COVID)  RVPGX2 - Abnormal; Notable for the following components:      Result Value   Influenza A by PCR POSITIVE (*)    All other components within normal limits  BASIC METABOLIC PANEL - Abnormal; Notable for the following components:   Creatinine, Ser 1.18 (*)    GFR, Estimated 53 (*)    All other components within normal limits  CBC - Abnormal; Notable for the following components:   RBC 3.85 (*)    HCT 35.2 (*)    All other components within normal limits    EKG None  Radiology DG Chest 2 View  Result Date: 06/29/2022 CLINICAL DATA:  Shortness of breath EXAM: CHEST - 2 VIEW COMPARISON:  11/28/2021 FINDINGS: The heart size and mediastinal contours are within normal limits. Both lungs are clear. The visualized skeletal structures are unremarkable. IMPRESSION: No active cardiopulmonary disease. Electronically Signed   By: Davina Poke D.O.   On: 06/29/2022 20:47    Procedures Procedures    Medications Ordered in ED Medications  albuterol (VENTOLIN HFA) 108 (90 Base) MCG/ACT inhaler 2 puff (has no administration in time range)    ED Course/ Medical Decision Making/ A&P                           Medical Decision Making Amount and/or Complexity of Data Reviewed Labs: ordered. Radiology: ordered.  Risk Prescription drug management.   Patient presents with flulike symptoms, found to have influenza.  She is in no distress.  No hypoxia.  She ambulate without difficulty.  Symptoms ongoing for at least a week, therefore would not give Tamiflu.  X-ray was personally visualized and is negative.  She reports chronic hemoptysis that she has each day that is unchanged.  This is already known to pulmonology.  Low suspicion for PE or  massive hemoptysis.  She is safe for discharge home        Final Clinical Impression(s) / ED Diagnoses Final diagnoses:  Influenza A    Rx / DC Orders ED Discharge Orders     None         Ripley Fraise, MD 06/30/22 0200

## 2022-06-30 NOTE — ED Notes (Addendum)
Pt ambulated well around the nurses station without difficulty. O2 remained 95 or above

## 2022-07-04 ENCOUNTER — Other Ambulatory Visit (HOSPITAL_COMMUNITY): Payer: Self-pay | Admitting: Adult Health

## 2022-07-04 DIAGNOSIS — Z1231 Encounter for screening mammogram for malignant neoplasm of breast: Secondary | ICD-10-CM

## 2022-07-12 ENCOUNTER — Encounter: Payer: Self-pay | Admitting: Internal Medicine

## 2022-08-10 ENCOUNTER — Ambulatory Visit (HOSPITAL_COMMUNITY)
Admission: RE | Admit: 2022-08-10 | Discharge: 2022-08-10 | Disposition: A | Discharge status: Home / Self Care | Payer: Medicare Other | Source: Ambulatory Visit | Attending: Adult Health | Admitting: Adult Health

## 2022-08-10 ENCOUNTER — Ambulatory Visit (HOSPITAL_COMMUNITY)
Admission: RE | Admit: 2022-08-10 | Discharge: 2022-08-10 | Disposition: A | Discharge status: Home / Self Care | Payer: Medicare Other | Source: Ambulatory Visit | Attending: Emergency Medicine | Admitting: Emergency Medicine

## 2022-08-10 DIAGNOSIS — Z1231 Encounter for screening mammogram for malignant neoplasm of breast: Secondary | ICD-10-CM | POA: Insufficient documentation

## 2022-08-10 DIAGNOSIS — R911 Solitary pulmonary nodule: Secondary | ICD-10-CM | POA: Diagnosis not present

## 2022-08-10 DIAGNOSIS — Z8542 Personal history of malignant neoplasm of other parts of uterus: Secondary | ICD-10-CM | POA: Insufficient documentation

## 2022-08-10 DIAGNOSIS — I700 Atherosclerosis of aorta: Secondary | ICD-10-CM | POA: Insufficient documentation

## 2022-08-10 DIAGNOSIS — R9389 Abnormal findings on diagnostic imaging of other specified body structures: Secondary | ICD-10-CM | POA: Diagnosis not present

## 2022-08-17 ENCOUNTER — Encounter: Payer: Self-pay | Admitting: Emergency Medicine

## 2022-08-17 ENCOUNTER — Ambulatory Visit (INDEPENDENT_AMBULATORY_CARE_PROVIDER_SITE_OTHER): Payer: Medicare Other | Admitting: Emergency Medicine

## 2022-08-17 VITALS — BP 110/80 | HR 92 | Temp 97.8°F | Ht 62.5 in | Wt 172.2 lb

## 2022-08-17 DIAGNOSIS — R9389 Abnormal findings on diagnostic imaging of other specified body structures: Secondary | ICD-10-CM

## 2022-08-17 DIAGNOSIS — R042 Hemoptysis: Secondary | ICD-10-CM | POA: Diagnosis not present

## 2022-08-17 NOTE — Patient Instructions (Addendum)
We will obtain blood work today. We reviewed your CT chest today. We will plan to repeat a CXR next visit. Depending on results we will decide whether to repeat a CT chest.  There is no clear indication to repeat your bronchoscopy at this time, but we may consider doing this depending on how your symptoms and imaging are doing  Follow with gastroenterology as planned.  Follow with Dr Lamonte Sakai in 3 months with a CXR.

## 2022-08-17 NOTE — Assessment & Plan Note (Signed)
Persistent small 3 mm right middle lobe nodule that does not need any follow-up.

## 2022-08-17 NOTE — Assessment & Plan Note (Signed)
Continues to have some intermittent pink sputum.  She has had a reassuring ENT evaluation and denies any epistaxis.  She has had a reassuring bronchoscopy and airway inspection.  Her CT chest initially showed a possible subtle right middle lobe infiltrate, now resolved on her most recent CT 08/10/2022.  No cultures were done when she underwent her bronchoscopy because the sample was lost.  She had autoimmune labs done by gastroenterology in 2020 but I do not see that any ANCA was done.  I will perform this now.  She will have a repeat chest x-ray when I see her back in 3 months.  Depending on the cough, hemoptysis and depending on her chest x-ray we will decide whether she needs a repeat CT chest and/or repeat bronchoscopy.  I do want her to continue to follow with gastroenterology and she has a follow-up appoint with Dr. Gala Romney soon.  Question whether some of this may be subtle hematemesis.

## 2022-08-17 NOTE — Progress Notes (Signed)
Subjective:    Patient ID: Susan Baker, female    DOB: 01-Sep-1960, 62 y.o.   MRN: [id number]  HPI  ROV 08/17/22 --62 year old woman with history of former tobacco use, active vape use, intermittent cough with some scant hemoptysis (question a component of epistaxis).  Part of evaluation including a CT scan of the chest that showed a subtle right middle lobe infiltrate.  She underwent inspection bronchoscopy that did not show any source of bleeding.  No cultures were obtained.  Inflammatory changes resolved on her most recent CT from August.  Repeat performed in February as below. Since I last saw her she was diagnosed with influenza. She still has cough and will see pink mucous. No epistaxis. No obvious hematemesis either.   CT scan of the chest 08/10/2022 reviewed by me, shows no recurrence of any inflammatory change.  There is some mild right upper lobe and right middle lobe atelectatic change, mild posterior right lower lobe reticular change.  There is a small 3 mm pulmonary nodule in the right middle lobe that is unchanged going back to 2009    Review of Systems As per HPI  Past Medical History:  Diagnosis Date   Abnormal Pap smear    Abnormal ThinPrep Pap test of vagina 04/25/2017   LSIL will get colp__________   Anxiety    Bulging disc    cervical, [facility]   COPD (chronic obstructive pulmonary disease) ([place])    Depression 02/18/2013   Fibromyalgia    GERD (gastroesophageal reflux disease)    egd with RE per patient, remote   Hematuria 05/14/2014   History of abnormal cervical Pap smear 02/24/2014   Hypertension    Osteoarthritis (arthritis due to wear and tear of joints)    Osteoporosis    Osteoporosis, unspecified 03/03/2014   Postcoital bleeding 09/15/2015   Postmenopausal 02/24/2014   PUD (peptic ulcer disease)    bleeding PUD per patient, remote, On Daypro   Urinary frequency 05/14/2014   Uterine cancer ([place])    Vaginal Pap smear, abnormal      Family  History  Problem Relation Age of Onset   Cirrhosis Father        etoh, died with liver cancer   Diabetes Father    Cirrhosis Paternal Uncle        multiple, etoh   Pancreatic cancer Paternal Grandmother    Diabetes Paternal Grandmother    Kidney failure Mother    Diabetes Mother    Other Brother        crohn's disease   Hyperlipidemia Brother    Fibromyalgia Daughter    Diabetes Maternal Grandmother    Healthy Son    Colon cancer Maternal Uncle    Colon polyps Neg Hx      Social History   Socioeconomic History   Marital status: Divorced    Spouse name: Not on file   Number of children: 2   Years of education: Not on file   Highest education level: Not on file  Occupational History   Occupation: disability  Tobacco Use   Smoking status: Former    Packs/day: 0.50    Years: 35.00    Total pack years: 17.50    Types: E-cigarettes, Cigarettes    Quit date: 07/23/2019    Years since quitting: 3.0   Smokeless tobacco: Never   Tobacco comments:    using vape at times  Vaping Use   Vaping Use: Some days   Substances: Nicotine-salt  Substance  and Sexual Activity   Alcohol use: Yes    Alcohol/week: 0.0 standard drinks of alcohol    Comment: occasional social use    Drug use: No   Sexual activity: Not Currently    Birth control/protection: Surgical    Comment: hysterectomy  Other Topics Concern   Not on file  Social History Narrative   Not on file   Social Determinants of Health   Financial Resource Strain: Not on file  Food Insecurity: No Food Insecurity (12/22/2019)   Hunger Vital Sign    Worried About Running Out of Food in the Last Year: Never true    Ran Out of Food in the Last Year: Never true  Transportation Needs: No Transportation Needs (12/22/2019)   PRAPARE - Hydrologist (Medical): No    Lack of Transportation (Non-Medical): No  Physical Activity: Sufficiently Active (12/22/2019)   Exercise Vital Sign    Days of Exercise  per Week: 5 days    Minutes of Exercise per Session: 30 min  Stress: Stress Concern Present (12/22/2019)   [place]    Feeling of Stress : Rather much  Social Connections: Moderately Isolated (12/22/2019)   Social Connection and Isolation Panel [NHANES]    Frequency of Communication with Friends and Family: More than three times a week    Frequency of Social Gatherings with Friends and Family: Twice a week    Attends Religious Services: More than 4 times per year    Active Member of [employer] or Organizations: No    Attends Archivist Meetings: Never    Marital Status: Divorced  Human resources officer Violence: Not At Risk (12/22/2019)   Humiliation, Afraid, Rape, and Kick questionnaire    Fear of Current or Ex-Partner: No    Emotionally Abused: No    Physically Abused: No    Sexually Abused: No     Allergies  Allergen Reactions   Cymbalta [Duloxetine Hcl] Other (See Comments)    Depression, anxiety, wt gain   Daypro [Oxaprozin] Other (See Comments)    Ulcers   Sulfa Antibiotics Other (See Comments)    Bactrim-Could not function   Trazodone And Nefazodone Other (See Comments)    Could not function   Nucynta [Tapentadol] Other (See Comments)    Memory loss and slurred speech     Outpatient Medications Prior to Visit  Medication Sig Dispense Refill   FLUoxetine (PROZAC) 40 MG capsule Take 40 mg by mouth daily.     fluticasone (FLONASE) 50 MCG/ACT nasal spray Spray 2 sprays every day by intranasal route.     lisinopril (ZESTRIL) 10 MG tablet Take 10 mg by mouth daily.     methocarbamol (ROBAXIN) 500 MG tablet Take 500 mg by mouth every 8 (eight) hours as needed for muscle spasms.     ondansetron (ZOFRAN-ODT) 4 MG disintegrating tablet Take 1 tablet (4 mg total) by mouth every 8 (eight) hours as needed for nausea or vomiting. 30 tablet 1   pantoprazole (PROTONIX) 40 MG tablet Take 1 tablet (40 mg total) by mouth 2  (two) times daily before a meal. 60 tablet 3   pregabalin (LYRICA) 150 MG capsule Take 150 mg by mouth 2 (two) times daily.     HYDROcodone-acetaminophen (NORCO/VICODIN) 5-325 MG tablet Take 1 tablet by mouth every 4 (four) hours as needed for moderate pain. Take 1 tablet 4 times daily (Patient not taking: Reported on 08/17/2022)  Tiotropium Bromide Monohydrate 1.25 MCG/ACT AERS Inhale 1.25 mcg into the lungs daily as needed (Breathing issues). (Patient not taking: Reported on 08/17/2022)     VENTOLIN HFA 108 (90 Base) MCG/ACT inhaler Inhale 1 puff into the lungs every 4 (four) hours as needed for shortness of breath. (Patient not taking: Reported on 08/17/2022)     No facility-administered medications prior to visit.         Objective:   Physical Exam Vitals:   08/17/22 1427  BP: 110/80  Pulse: 92  Temp: 97.8 F (36.6 C)  TempSrc: Oral  SpO2: 98%  Weight: 172 lb 3.2 oz (78.1 kg)  Height: 5' 2.5" (1.588 m)   Gen: Pleasant, well-nourished, in no distress,  normal affect  ENT: No lesions,  mouth clear,  oropharynx clear, no postnasal drip.  Her septum in the left nare looks raw, erythematous with some dried blood  Neck: No JVD, no stridor  Lungs: No use of accessory muscles, no crackles or wheezing on normal respiration, no wheeze on forced expiration  Cardiovascular: RRR, heart sounds normal, no murmur or gallops, no peripheral edema  Musculoskeletal: No deformities, no cyanosis or clubbing  Neuro: alert, awake, non focal  Skin: Warm, no lesions or rash      Assessment & Plan:  Hemoptysis Continues to have some intermittent pink sputum.  She has had a reassuring ENT evaluation and denies any epistaxis.  She has had a reassuring bronchoscopy and airway inspection.  Her CT chest initially showed a possible subtle right middle lobe infiltrate, now resolved on her most recent CT 08/10/2022.  No cultures were done when she underwent her bronchoscopy because the sample was lost.   She had autoimmune labs done by gastroenterology in 2020 but I do not see that any ANCA was done.  I will perform this now.  She will have a repeat chest x-ray when I see her back in 3 months.  Depending on the cough, hemoptysis and depending on her chest x-ray we will decide whether she needs a repeat CT chest and/or repeat bronchoscopy.  I do want her to continue to follow with gastroenterology and she has a follow-up appoint with Dr. Gala Romney soon.  Question whether some of this may be subtle hematemesis.  Abnormal CT of the chest Persistent small 3 mm right middle lobe nodule that does not need any follow-up.   Baltazar Apo, MD, PhD 08/17/2022, 3:16 PM [facility] [phone] or if no answer before 7:00PM call [phone] For any issues after 7:00PM please call eLink [phone]

## 2022-08-20 LAB — ANCA SCREEN W REFLEX TITER: ANCA SCREEN: NEGATIVE

## 2022-08-22 ENCOUNTER — Telehealth (INDEPENDENT_AMBULATORY_CARE_PROVIDER_SITE_OTHER): Payer: Medicare Other | Admitting: Gastroenterology

## 2022-08-22 ENCOUNTER — Other Ambulatory Visit: Payer: Self-pay

## 2022-08-22 ENCOUNTER — Encounter: Payer: Self-pay | Admitting: Gastroenterology

## 2022-08-22 ENCOUNTER — Telehealth: Payer: Self-pay | Admitting: Gastroenterology

## 2022-08-22 VITALS — Ht 62.5 in | Wt 170.0 lb

## 2022-08-22 DIAGNOSIS — K760 Fatty (change of) liver, not elsewhere classified: Secondary | ICD-10-CM

## 2022-08-22 DIAGNOSIS — K219 Gastro-esophageal reflux disease without esophagitis: Secondary | ICD-10-CM | POA: Diagnosis not present

## 2022-08-22 MED ORDER — PANTOPRAZOLE SODIUM 40 MG PO TBEC: 40.0000 mg | DELAYED_RELEASE_TABLET | Freq: Every day | ORAL | 3 refills | Status: DC

## 2022-08-22 NOTE — Patient Instructions (Signed)
I have refilled pantoprazole for you! Take 30 minutes before breakfast daily.  We are ordering an ultrasound of your liver.   I recommend liver numbers checked every 6 months!  We will see you in 1 year!  I enjoyed seeing you again today! At our first visit, I mentioned how I value our relationship and want to provide genuine, compassionate, and quality care. You may receive a survey regarding your visit with me, and I welcome your feedback! Thanks so much for taking the time to complete this. I look forward to seeing you again.   Annitta Needs, PhD, ANP-BC [facility]

## 2022-08-22 NOTE — Telephone Encounter (Signed)
NOTED  Labs printed and will be mailed to the pt at a later date

## 2022-08-22 NOTE — Telephone Encounter (Signed)
Mindy/Tammy:  I saw patient virtually today. Needs US abdomen complete due to fatty liver. I have placed orders.   Dena: needs HFP in 6 months  Mandy: needs 1 year follow-up visit on recall.

## 2022-08-22 NOTE — Progress Notes (Signed)
Primary Care Physician:  Jani Gravel, MD  Primary GI: Dr. Gala Romney   Patient Location: Home   Provider Location: [facility] office   Reason for Visit: Follow-up   Persons present on the virtual encounter, with roles: Patient and NP   Total time (minutes) spent on medical discussion: 10 minutes   Due to COVID-19, visit was conducted using virtual method.  Visit was requested by patient.  Virtual Visit via MyChart Video Note Due to COVID-19, visit is conducted virtually and was requested by patient.   I connected with Susan Baker on 08/22/22 at 10:00 AM EST by video and verified that I am speaking with the correct person using two identifiers.   I discussed the limitations, risks, security and privacy concerns of performing an evaluation and management service by video and the availability of in person appointments. I also discussed with the patient that there may be a patient responsible charge related to this service. The patient expressed understanding and agreed to proceed.  Chief Complaint  Patient presents with   Follow-up    Follow up on CT scan and fatty liver and labs.     History of Present Illness: 62 y.o. female presenting today in follow-up with a history of GERD, hepatic steatosis, RUQ pain for many years s/p cholecystectomy in the past. Colonoscopy due in 2027.   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 Reactive gastropathy with foveolar hyperplasia and focal erosion. Negative H.pylori.    Severe hepatic steatosis on CT chest. Spitting up blood still. Bronchoscopy done by Dr. Lamonte Sakai. May need bronch again. Concerned about pancreatic calcifications that were picked up on imaging in [place]. I do not have this, but patient states she will upload report to [place].   Diagnosed with lupus and mixed connective tissue disease this past week. Has seen ENT within the past 3 months.  No vomiting of blood. [place]  spitting up blood and clearing throat.   Pantoprazole once daily.   Outside LFTs normal Feb 2024 through [facility]. No thrombocytopenia.     Past Medical History:  Diagnosis Date   Abnormal Pap smear    Abnormal ThinPrep Pap test of vagina 04/25/2017   LSIL will get colp__________   Anxiety    Bulging disc    cervical, [facility]   COPD (chronic obstructive pulmonary disease) ([place])    Depression 02/18/2013   Fibromyalgia    GERD (gastroesophageal reflux disease)    egd with RE per patient, remote   Hematuria 05/14/2014   History of abnormal cervical Pap smear 02/24/2014   Hypertension    Osteoarthritis (arthritis due to wear and tear of joints)    Osteoporosis    Osteoporosis, unspecified 03/03/2014   Postcoital bleeding 09/15/2015   Postmenopausal 02/24/2014   PUD (peptic ulcer disease)    bleeding PUD per patient, remote, On Daypro   Urinary frequency 05/14/2014   Uterine cancer ([place])    Vaginal Pap smear, abnormal      Past Surgical History:  Procedure Laterality Date   BIOPSY  01/18/2022   Procedure: BIOPSY;  Surgeon: Daneil Dolin, MD;  [facility];  Service: Endoscopy;;   BRONCHIAL BIOPSY  01/26/2022   Procedure: BRONCHIAL BIOPSIES;  Surgeon: Collene Gobble, MD;  Location: [facility] ENDOSCOPY;  Service: Cardiopulmonary;;   BRONCHIAL BRUSHINGS  01/26/2022   Procedure: BRONCHIAL BRUSHINGS;  Surgeon: Collene Gobble, MD;  Location: [facility] ENDOSCOPY;  Service: Cardiopulmonary;;  BRONCHIAL WASHINGS  01/26/2022   Procedure: BRONCHIAL WASHINGS;  Surgeon: Collene Gobble, MD;  Location: [place];  Service: Cardiopulmonary;;   CHOLECYSTECTOMY N/A 11/07/2019   Procedure: LAPAROSCOPIC CHOLECYSTECTOMY;  Surgeon: Virl Cagey, MD;  Location: [facility] OR[facility];  Service: General;  Laterality: N/A;   COLONOSCOPY WITH PROPOFOL N/A 11/22/2015   Dr.Rourk- the entire examined colon is normal, the examined portion of the ileum was normal, non-bleeding internal hemorrhoids. no  specimens collected.    ECTOPIC PREGNANCY SURGERY     twice   ESOPHAGEAL DILATION N/A 11/22/2015   Procedure: ESOPHAGEAL DILATION;  Surgeon: Daneil Dolin, MD;  [facility];  Service: Endoscopy;  Laterality: N/A;   ESOPHAGOGASTRODUODENOSCOPY  12/01/2010   Dr. Gala Romney: mild distal ERE, antral erosions due to NSAIDS, no H.Pylori   ESOPHAGOGASTRODUODENOSCOPY (EGD) WITH PROPOFOL N/A 11/22/2015   Dr.Rourk- grade A esophagitis, dilated. small hiatal hernia, normal stomach, normal second portion of the duodenum.   ESOPHAGOGASTRODUODENOSCOPY (EGD) WITH PROPOFOL N/A 10/27/2019   normal esophagus, normal stomach, normal duodenum.    ESOPHAGOGASTRODUODENOSCOPY (EGD) WITH PROPOFOL N/A 01/18/2022   Procedure: ESOPHAGOGASTRODUODENOSCOPY (EGD) WITH PROPOFOL;  Surgeon: Daneil Dolin, MD;  [facility];  Service: Endoscopy;  Laterality: N/A;  12:45pm   hysterectomy for uterine cancer     partial   VIDEO BRONCHOSCOPY N/A 01/26/2022   Procedure: VIDEO BRONCHOSCOPY WITHOUT FLUORO;  Surgeon: Collene Gobble, MD;  Location: [facility];  Service: Cardiopulmonary;  Laterality: N/A;     Current Meds  Medication Sig   FLUoxetine (PROZAC) 40 MG capsule Take 40 mg by mouth daily.   fluticasone (FLONASE) 50 MCG/ACT nasal spray Spray 2 sprays every day by intranasal route.   hydroxychloroquine (PLAQUENIL) 200 MG tablet Take by mouth daily.   lisinopril (ZESTRIL) 10 MG tablet Take 10 mg by mouth daily.   methocarbamol (ROBAXIN) 500 MG tablet Take 500 mg by mouth every 8 (eight) hours as needed for muscle spasms.   ondansetron (ZOFRAN-ODT) 4 MG disintegrating tablet Take 1 tablet (4 mg total) by mouth every 8 (eight) hours as needed for nausea or vomiting.   pantoprazole (PROTONIX) 40 MG tablet Take 1 tablet (40 mg total) by mouth 2 (two) times daily before a meal.   predniSONE (DELTASONE) 10 MG tablet Take 10 mg by mouth daily with breakfast.   pregabalin (LYRICA) 150 MG capsule Take 150 mg by  mouth 2 (two) times daily.     Family History  Problem Relation Age of Onset   Cirrhosis Father        etoh, died with liver cancer   Diabetes Father    Cirrhosis Paternal Uncle        multiple, etoh   Pancreatic cancer Paternal Grandmother    Diabetes Paternal Grandmother    Kidney failure Mother    Diabetes Mother    Other Brother        crohn's disease   Hyperlipidemia Brother    Fibromyalgia Daughter    Diabetes Maternal Grandmother    Healthy Son    Colon cancer Maternal Uncle    Colon polyps Neg Hx     Social History   Socioeconomic History   Marital status: Divorced    Spouse name: Not on file   Number of children: 2   Years of education: Not on file   Highest education level: Not on file  Occupational History   Occupation: disability  Tobacco Use   Smoking status: Former    Packs/day: 0.50  Years: 35.00    Total pack years: 17.50    Types: E-cigarettes, Cigarettes    Quit date: 07/23/2019    Years since quitting: 3.0   Smokeless tobacco: Never   Tobacco comments:    using vape at times  Vaping Use   Vaping Use: Some days   Substances: Nicotine-salt  Substance and Sexual Activity   Alcohol use: Yes    Alcohol/week: 0.0 standard drinks of alcohol    Comment: occasional social use    Drug use: No   Sexual activity: Not Currently    Birth control/protection: Surgical    Comment: hysterectomy  Other Topics Concern   Not on file  Social History Narrative   Not on file   Social Determinants of Health   Financial Resource Strain: Not on file  Food Insecurity: No Food Insecurity (12/22/2019)   Hunger Vital Sign    Worried About Running Out of Food in the Last Year: Never true    Ran Out of Food in the Last Year: Never true  Transportation Needs: No Transportation Needs (12/22/2019)   PRAPARE - Hydrologist (Medical): No    Lack of Transportation (Non-Medical): No  Physical Activity: Sufficiently Active (12/22/2019)    Exercise Vital Sign    Days of Exercise per Week: 5 days    Minutes of Exercise per Session: 30 min  Stress: Stress Concern Present (12/22/2019)   [place]    Feeling of Stress : Rather much  Social Connections: Moderately Isolated (12/22/2019)   Social Connection and Isolation Panel [NHANES]    Frequency of Communication with Friends and Family: More than three times a week    Frequency of Social Gatherings with Friends and Family: Twice a week    Attends Religious Services: More than 4 times per year    Active Member of [employer] or Organizations: No    Attends Archivist Meetings: Never    Marital Status: Divorced       Review of Systems: Gen: Denies fever, chills, anorexia. Denies fatigue, weakness, weight loss.  CV: Denies chest pain, palpitations, syncope, peripheral edema, and claudication. Resp: Denies dyspnea at rest, cough, wheezing, coughing up blood, and pleurisy. GI: see HPI Derm: Denies rash, itching, dry skin Psych: Denies depression, anxiety, memory loss, confusion. No homicidal or suicidal ideation.  Heme: Denies bruising, bleeding, and enlarged lymph nodes.  Observations/Objective: No distress. Unable to perform physical exam due to video encounter.   Assessment and Plan: 62 y.o. female presenting today in follow-up with a history of GERD, hepatic steatosis, RUQ pain for many years s/p cholecystectomy in the past. Colonoscopy due in 2027.   GERD well-controlled on PPI daily. Refills provided.   She has history of hepatic steatosis, and recent CT chest noted severe hepatic steatosis. Will update US abdomen complete, as it has been several years since this was done. LFTs normal via [facility] in Feb 2024. No thrombocytopenia. No concern for underlying advanced chronic liver disease. I did review lipid panel, which was excellent. Reviewed with patient as well.   Recommend HFP every 6 months. Will  see her in 1 year for GERD follow-up. US abdomen to be arranged in interim. Continue follow-up with Pulmonology regarding hemoptysis. Notably, she has also seen ENT within the past few months.     Follow Up Instructions:    I discussed the assessment and treatment plan with the patient. The patient was provided an opportunity  to ask questions and all were answered. The patient agreed with the plan and demonstrated an understanding of the instructions.   The patient was advised to call back or seek an in-person evaluation if the symptoms worsen or if the condition fails to improve as anticipated.  I provided 10 minutes of face-to-face time during this MyChart Video encounter.  Annitta Needs, PhD, ANP-BC [facility]

## 2022-09-01 ENCOUNTER — Ambulatory Visit (HOSPITAL_COMMUNITY)
Admission: RE | Admit: 2022-09-01 | Discharge: 2022-09-01 | Disposition: A | Discharge status: Home / Self Care | Payer: Medicare Other | Source: Ambulatory Visit | Attending: Gastroenterology | Admitting: Gastroenterology

## 2022-09-01 DIAGNOSIS — K760 Fatty (change of) liver, not elsewhere classified: Secondary | ICD-10-CM | POA: Diagnosis present

## 2022-12-11 IMAGING — CR DG THORACIC SPINE 3V
3 series · 3 of 3 positions shown · non-contrast
Comparison: X-ray dated May 19, 2011

CLINICAL DATA: Back pain

EXAM:
THORACIC SPINE - 3 VIEWS

[w thoracic spine ap]
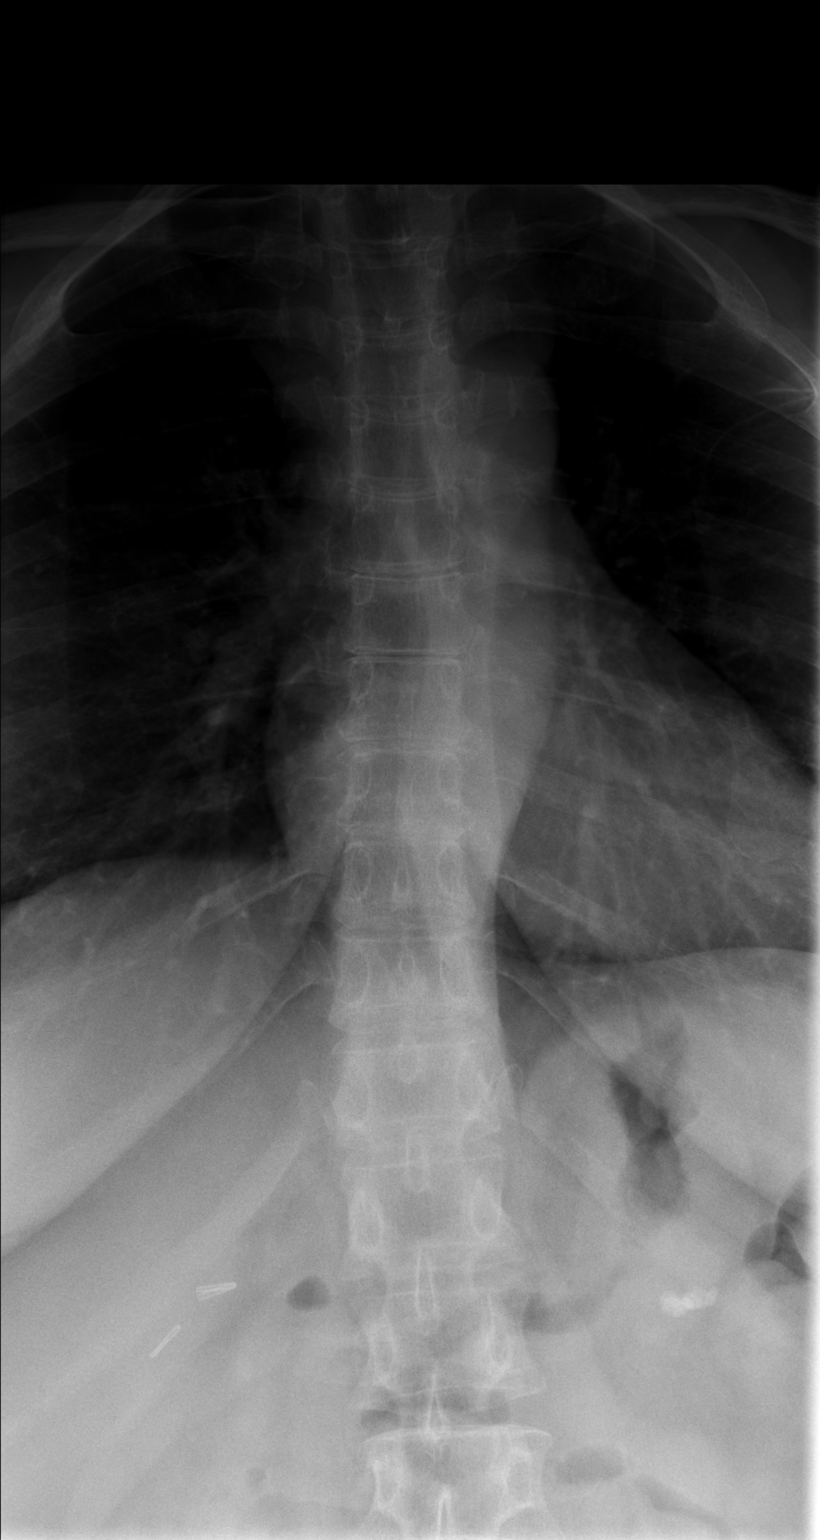

[w thoracic spine lat]
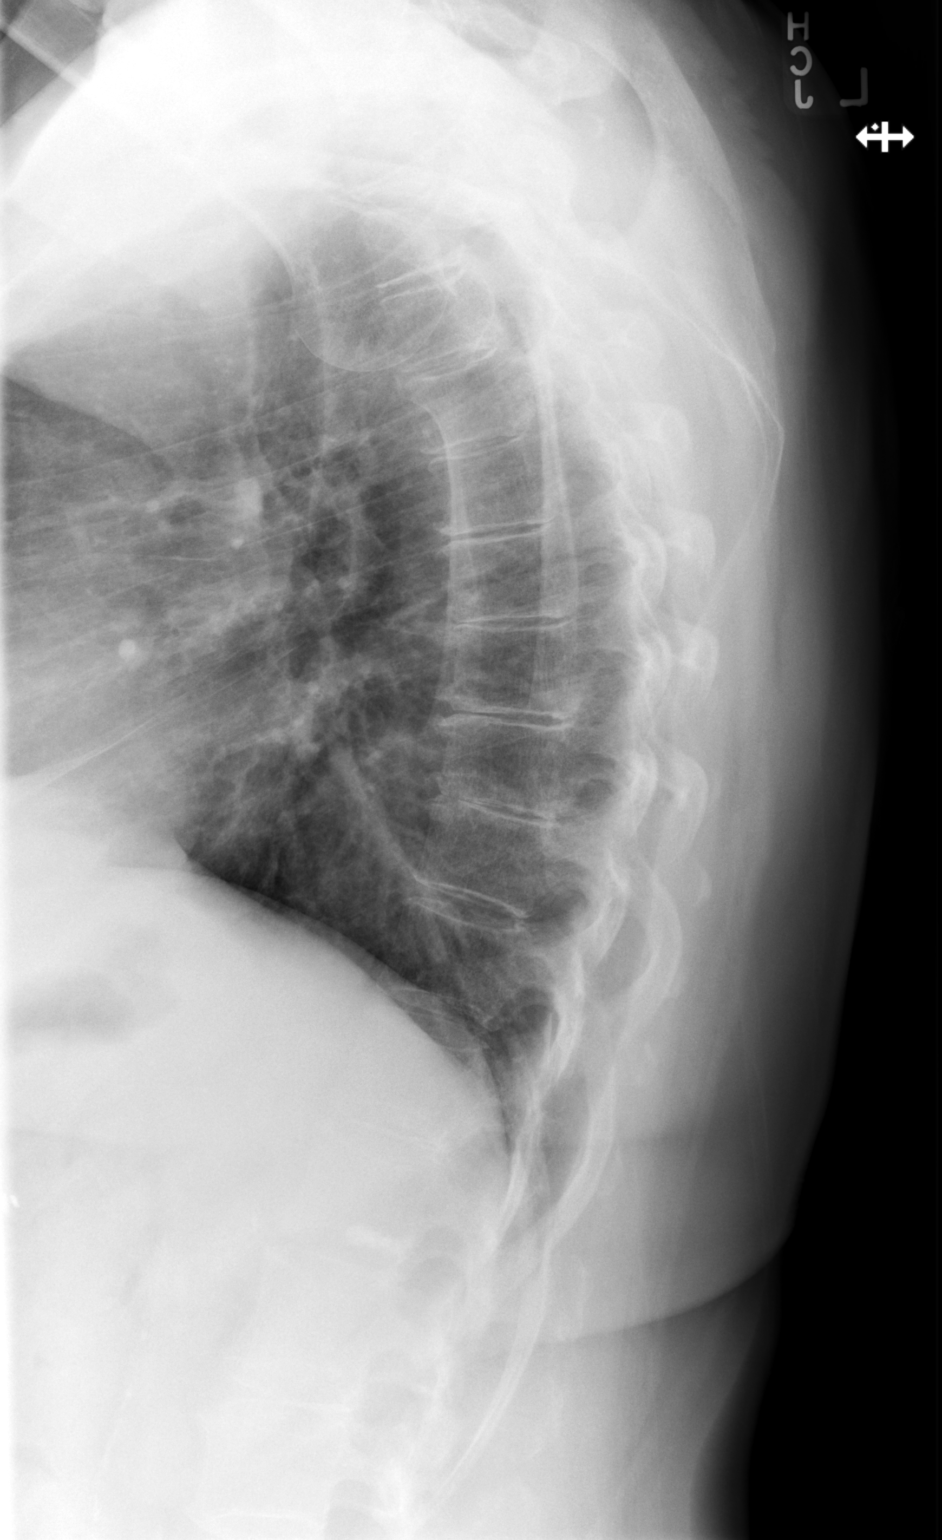

[w thoracic swimmers]
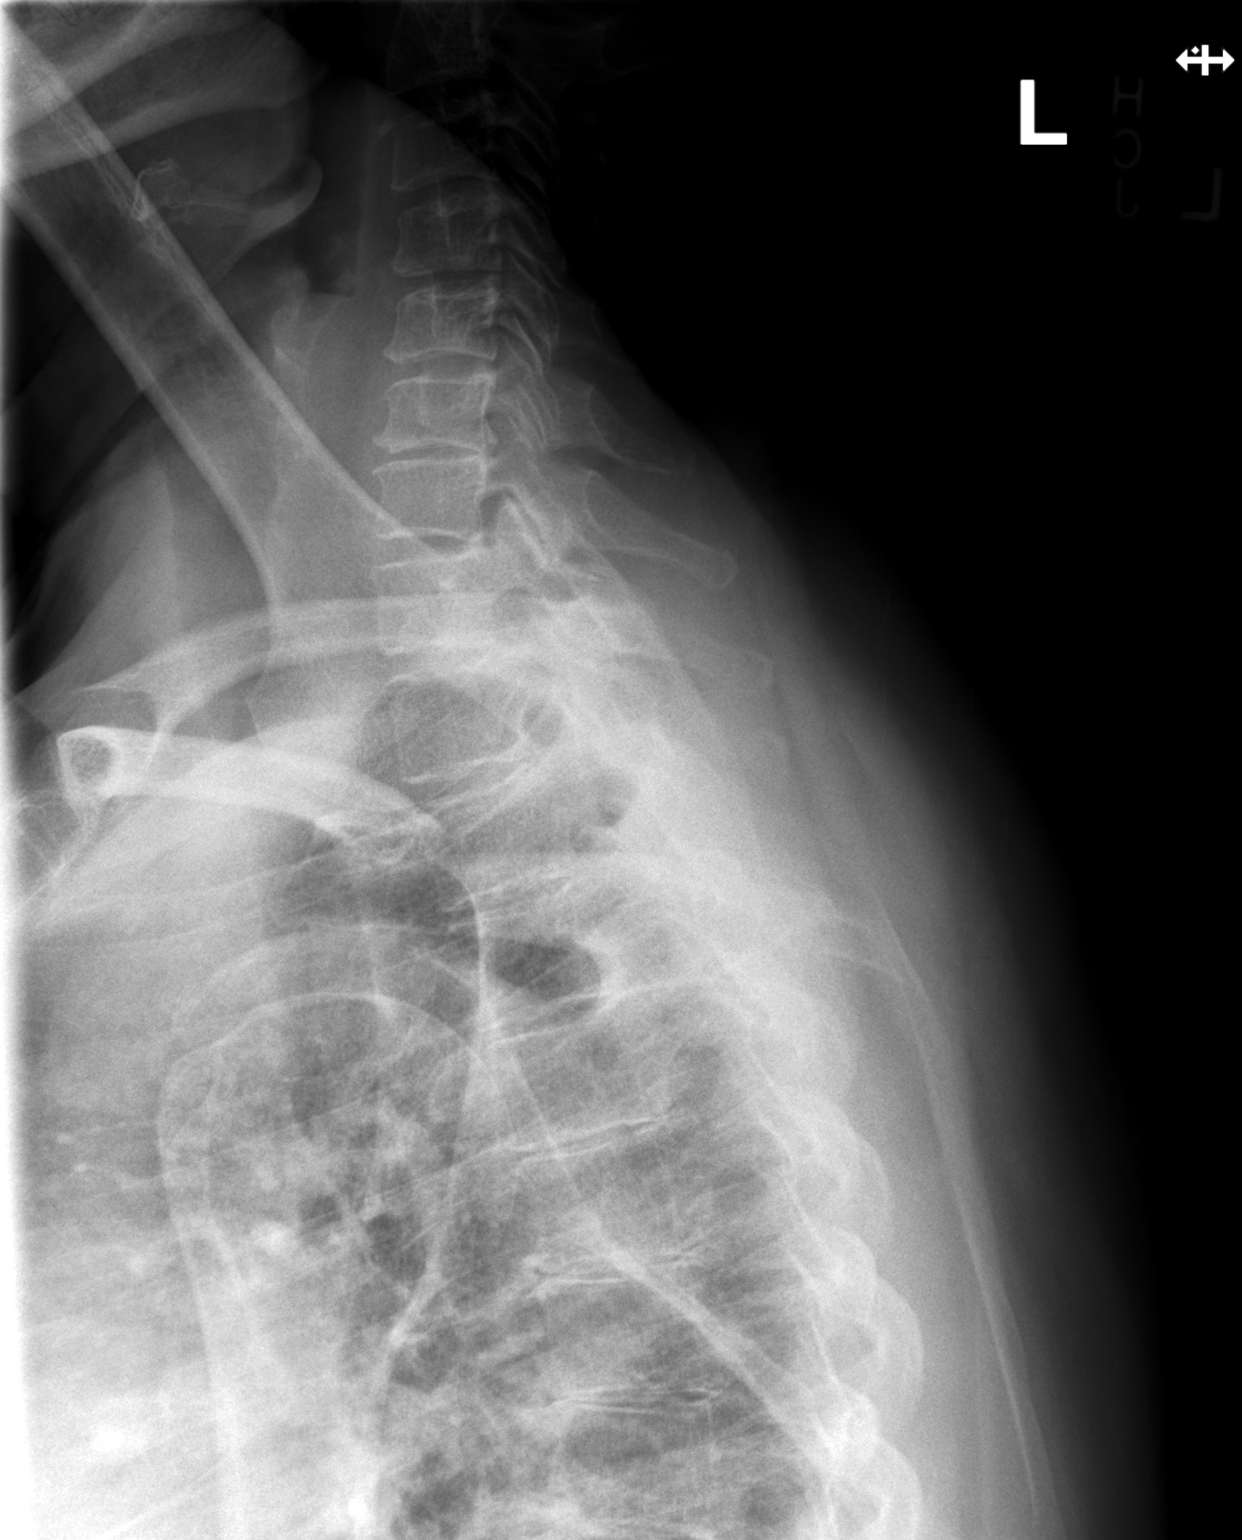

[3 of 3 positions shown; findings below may reference images not displayed]

FINDINGS: There is no evidence of thoracic spine fracture. Unchanged subtle
dextrocurvature of the thoracolumbar spine.

Mild multilevel degenerative disc disease consisting of osteophyte
formation, increased compared to prior exam.

Cholecystectomy clips. Calcification of the left upper quadrant,
which correlates with calyceal diverticulum containing
calcifications seen on prior MRI. Soft tissues are otherwise
unremarkable.
IMPRESSION: Mild multilevel degenerative disc disease, increased compared to
prior exam.

## 2023-01-26 ENCOUNTER — Ambulatory Visit (HOSPITAL_COMMUNITY)
Admission: RE | Admit: 2023-01-26 | Discharge: 2023-01-26 | Disposition: A | Discharge status: Home / Self Care | Payer: Medicare Other | Source: Ambulatory Visit | Attending: Emergency Medicine | Admitting: Emergency Medicine

## 2023-01-26 DIAGNOSIS — R042 Hemoptysis: Secondary | ICD-10-CM | POA: Insufficient documentation

## 2023-01-30 ENCOUNTER — Other Ambulatory Visit: Payer: Self-pay | Admitting: Gastroenterology

## 2023-02-20 ENCOUNTER — Ambulatory Visit: Payer: Medicare Other | Admitting: Emergency Medicine

## 2023-03-13 ENCOUNTER — Ambulatory Visit: Payer: Medicare Other

## 2023-03-13 ENCOUNTER — Ambulatory Visit: Payer: Medicare Other | Admitting: Emergency Medicine

## 2023-03-13 ENCOUNTER — Other Ambulatory Visit: Payer: Self-pay | Admitting: Emergency Medicine

## 2023-03-13 DIAGNOSIS — J069 Acute upper respiratory infection, unspecified: Secondary | ICD-10-CM

## 2023-04-10 ENCOUNTER — Other Ambulatory Visit: Payer: Self-pay | Admitting: Gastroenterology

## 2023-04-11 LAB — HEPATIC FUNCTION PANEL
ALT: 27 [IU]/L (ref 0–32)
AST: 24 [IU]/L (ref 0–40)
Albumin: 4.7 g/dL (ref 3.9–4.9)
Alkaline Phosphatase: 45 [IU]/L (ref 44–121)
Bilirubin Total: 0.4 mg/dL (ref 0.0–1.2)
Bilirubin, Direct: 0.15 mg/dL (ref 0.00–0.40)
Total Protein: 6.6 g/dL (ref 6.0–8.5)

## 2023-04-11 LAB — SPECIMEN STATUS REPORT

## 2023-05-01 ENCOUNTER — Ambulatory Visit (INDEPENDENT_AMBULATORY_CARE_PROVIDER_SITE_OTHER): Payer: Medicare Other | Admitting: Gastroenterology

## 2023-05-01 ENCOUNTER — Encounter: Payer: Self-pay | Admitting: Gastroenterology

## 2023-05-01 VITALS — BP 130/88 | HR 86 | Temp 98.6°F | Ht 62.5 in | Wt 164.2 lb

## 2023-05-01 DIAGNOSIS — K760 Fatty (change of) liver, not elsewhere classified: Secondary | ICD-10-CM | POA: Diagnosis not present

## 2023-05-01 DIAGNOSIS — R159 Full incontinence of feces: Secondary | ICD-10-CM

## 2023-05-01 NOTE — Patient Instructions (Addendum)
Please complete the blood work.   I recommend taking Benefiber each morning. Please message me on MyChart with an update on how things are doing.   We do not have any of the stool kits here. Let's get the bowel habits under control first. We could potentially do Cologuard testing, which I will order once we get things back to normal!  Further recommendations to follow!  I enjoyed seeing you again today! I value our relationship and want to provide genuine, compassionate, and quality care. You may receive a survey regarding your visit with me, and I welcome your feedback! Thanks so much for taking the time to complete this. I look forward to seeing you again.      Gelene Mink, PhD, ANP-BC [facility]

## 2023-05-01 NOTE — Progress Notes (Signed)
Gastroenterology Office Note     Primary Care Physician:  Pearson Grippe, MD  Primary Gastroenterologist: Dr Jena Gauss    Chief Complaint   Chief Complaint  Patient presents with   Follow-up    Pt to follow up on constipation but now it is diarrhea with leakage     History of Present Illness   Susan Baker is a 62 y.o. female presenting today with a history of GERD, hepatic steatosis, RUQ pain for many years s/p cholecystectomy in the past. Colonoscopy due in 2027    Since August: Having to wear a depends as no warning typically for loose stool. Sometimes a warning but can't get there in time. Once was constipated for 2 weeks in August. Since then has been back and forth. Usually stool is like a ball but dark. Some days may have 5 loose stools then the next day has a harder stool or sticky. On days not having diarrhea, may go 2-3 times in a row. Will sometimes have one diarrhea a day or up to 4, sometimes none the next day. No rectal bleeding. Seems like when eating cheese toast has more urgency. No greasy/fatty looking stool. No abdominal pain other than chronic RUQ discomfort. Started taking iron on own. Chronic bloating.   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 Spleen normal.   LFTs normal   EGD April 2021 that was normal   Last colonoscopy May 2017: Normal, terminal ileum normal, nonbleeding internal hemorrhoids.  Due for colonoscopy in 2027.    FH: no first degree relatives, possibly maternal uncle.  Father: cirrhosis liver (ETOH)  Past Medical History:  Diagnosis Date   Abnormal Pap smear    Abnormal ThinPrep Pap test of vagina 04/25/2017   LSIL will get colp__________   Anxiety    Bulging disc    cervical, [facility]   COPD (chronic obstructive pulmonary disease) (HCC)    Depression 02/18/2013   Fibromyalgia    GERD (gastroesophageal  reflux disease)    egd with RE per patient, remote   Hematuria 05/14/2014   History of abnormal cervical Pap smear 02/24/2014   Hypertension    Osteoarthritis (arthritis due to wear and tear of joints)    Osteoporosis    Osteoporosis, unspecified 03/03/2014   Postcoital bleeding 09/15/2015   Postmenopausal 02/24/2014   PUD (peptic ulcer disease)    bleeding PUD per patient, remote, On Daypro   Urinary frequency 05/14/2014   Uterine cancer (HCC)    Vaginal Pap smear, abnormal     Past Surgical History:  Procedure Laterality Date   BIOPSY  01/18/2022   Procedure: BIOPSY;  Surgeon: Corbin Ade, MD;  Location: [facility];  Service: Endoscopy;;   BRONCHIAL BIOPSY  01/26/2022   Procedure: BRONCHIAL BIOPSIES;  Surgeon: Leslye Peer, MD;  Location: [facility] ENDOSCOPY;  Service: Cardiopulmonary;;   BRONCHIAL BRUSHINGS  01/26/2022   Procedure: BRONCHIAL BRUSHINGS;  Surgeon: Leslye Peer, MD;  Location: [facility] ENDOSCOPY;  Service: Cardiopulmonary;;   BRONCHIAL WASHINGS  01/26/2022   Procedure: BRONCHIAL WASHINGS;  Surgeon: Leslye Peer, MD;  Location: [facility] ENDOSCOPY;  Service: Cardiopulmonary;;   CHOLECYSTECTOMY N/A 11/07/2019   Procedure: LAPAROSCOPIC CHOLECYSTECTOMY;  Surgeon: Lucretia Roers, MD;  Location: [facility] OR[facility];  Service: General;  Laterality: N/A;   COLONOSCOPY WITH PROPOFOL N/A 11/22/2015   Dr.Rourk- the entire examined colon is normal, the examined portion  of the ileum was normal, non-bleeding internal hemorrhoids. no specimens collected.    ECTOPIC PREGNANCY SURGERY     twice   ESOPHAGEAL DILATION N/A 11/22/2015   Procedure: ESOPHAGEAL DILATION;  Surgeon: Corbin Ade, MD;  Location: [facility];  Service: Endoscopy;  Laterality: N/A;   ESOPHAGOGASTRODUODENOSCOPY  12/01/2010   Dr. Jena Gauss: mild distal ERE, antral erosions due to NSAIDS, no H.Pylori   ESOPHAGOGASTRODUODENOSCOPY (EGD) WITH PROPOFOL N/A 11/22/2015   Dr.Rourk- grade A esophagitis, dilated. small hiatal hernia, normal  stomach, normal second portion of the duodenum.   ESOPHAGOGASTRODUODENOSCOPY (EGD) WITH PROPOFOL N/A 10/27/2019   normal esophagus, normal stomach, normal duodenum.    ESOPHAGOGASTRODUODENOSCOPY (EGD) WITH PROPOFOL N/A 01/18/2022   Procedure: ESOPHAGOGASTRODUODENOSCOPY (EGD) WITH PROPOFOL;  Surgeon: Corbin Ade, MD;  Location: [facility];  Service: Endoscopy;  Laterality: N/A;  12:45pm   hysterectomy for uterine cancer     partial   VIDEO BRONCHOSCOPY N/A 01/26/2022   Procedure: VIDEO BRONCHOSCOPY WITHOUT FLUORO;  Surgeon: Leslye Peer, MD;  Location: [facility] ENDOSCOPY;  Service: Cardiopulmonary;  Laterality: N/A;    Current Outpatient Medications  Medication Sig Dispense Refill   FLUoxetine (PROZAC) 40 MG capsule Take 40 mg by mouth daily.     fluticasone (FLONASE) 50 MCG/ACT nasal spray Spray 2 sprays every day by intranasal route.     hydroxychloroquine (PLAQUENIL) 200 MG tablet Take by mouth daily.     lisinopril (ZESTRIL) 10 MG tablet Take 10 mg by mouth daily.     methocarbamol (ROBAXIN) 500 MG tablet Take 500 mg by mouth every 8 (eight) hours as needed for muscle spasms.     pantoprazole (PROTONIX) 40 MG tablet Take 1 tablet (40 mg total) by mouth daily. 90 tablet 3   predniSONE (DELTASONE) 10 MG tablet Take 5 mg by mouth daily with breakfast.     pregabalin (LYRICA) 150 MG capsule Take 150 mg by mouth 2 (two) times daily.     ondansetron (ZOFRAN-ODT) 4 MG disintegrating tablet DISSOLVE 1 TABLET IN MOUTH EVERY 8 HOURS AS NEEDED FOR NAUSEA FOR VOMITING (Patient not taking: Reported on 05/01/2023) 30 tablet 0   No current facility-administered medications for this visit.    Allergies as of 05/01/2023 - Review Complete 05/01/2023  Allergen Reaction Noted   Cymbalta [duloxetine hcl] Other (See Comments) 02/22/2012   Daypro [oxaprozin] Other (See Comments) 11/07/2010   Sulfa antibiotics Other (See Comments) 02/22/2012   Trazodone and nefazodone Other (See Comments) 02/22/2012    Nucynta [tapentadol] Other (See Comments) 08/18/2015    Family History  Problem Relation Age of Onset   Cirrhosis Father        etoh, died with liver cancer   Diabetes Father    Cirrhosis Paternal Uncle        multiple, etoh   Pancreatic cancer Paternal Grandmother    Diabetes Paternal Grandmother    Kidney failure Mother    Diabetes Mother    Other Brother        crohn's disease   Hyperlipidemia Brother    Fibromyalgia Daughter    Diabetes Maternal Grandmother    Healthy Son    Colon cancer Maternal Uncle    Colon polyps Neg Hx     Social History   Socioeconomic History   Marital status: Divorced    Spouse name: Not on file   Number of children: 2   Years of education: Not on file   Highest education level: Not on file  Occupational History  Occupation: disability  Tobacco Use   Smoking status: Former    Current packs/day: 0.00    Average packs/day: 0.5 packs/day for 35.0 years (17.5 ttl pk-yrs)    Types: E-cigarettes, Cigarettes    Start date: 07/22/1984    Quit date: 07/23/2019    Years since quitting: 3.7   Smokeless tobacco: Never   Tobacco comments:    using vape at times  Vaping Use   Vaping status: Some Days   Substances: Nicotine-salt  Substance and Sexual Activity   Alcohol use: Yes    Alcohol/week: 0.0 standard drinks of alcohol    Comment: occasional social use    Drug use: No   Sexual activity: Not Currently    Birth control/protection: Surgical    Comment: hysterectomy  Other Topics Concern   Not on file  Social History Narrative   Not on file   Social Determinants of Health   Financial Resource Strain: Not on file  Food Insecurity: No Food Insecurity (12/22/2019)   Hunger Vital Sign    Worried About Running Out of Food in the Last Year: Never true    Ran Out of Food in the Last Year: Never true  Transportation Needs: No Transportation Needs (12/22/2019)   PRAPARE - Administrator, Civil Service (Medical): No    Lack of  Transportation (Non-Medical): No  Physical Activity: Sufficiently Active (12/22/2019)   Exercise Vital Sign    Days of Exercise per Week: 5 days    Minutes of Exercise per Session: 30 min  Stress: Stress Concern Present (12/22/2019)   [employer] of Occupational Health - Occupational Stress Questionnaire    Feeling of Stress : Rather much  Social Connections: Moderately Isolated (12/22/2019)   Social Connection and Isolation Panel [NHANES]    Frequency of Communication with Friends and Family: More than three times a week    Frequency of Social Gatherings with Friends and Family: Twice a week    Attends Religious Services: More than 4 times per year    Active Member of [employer] or Organizations: No    Attends Banker Meetings: Never    Marital Status: Divorced  Catering manager Violence: Not At Risk (12/22/2019)   Humiliation, Afraid, Rape, and Kick questionnaire    Fear of Current or Ex-Partner: No    Emotionally Abused: No    Physically Abused: No    Sexually Abused: No     Review of Systems   Gen: Denies any fever, chills, fatigue, weight loss, lack of appetite.  CV: Denies chest pain, heart palpitations, peripheral edema, syncope.  Resp: Denies shortness of breath at rest or with exertion. Denies wheezing or cough.  GI: Denies dysphagia or odynophagia. Denies jaundice, hematemesis, fecal incontinence. GU : Denies urinary burning, urinary frequency, urinary hesitancy MS: Denies joint pain, muscle weakness, cramps, or limitation of movement.  Derm: Denies rash, itching, dry skin Psych: Denies depression, anxiety, memory loss, and confusion Heme: Denies bruising, bleeding, and enlarged lymph nodes.   Physical Exam   BP 130/88   Pulse 86   Temp 98.6 F (37 C)   Ht 5' 2.5" (1.588 m)   Wt 164 lb 3.2 oz (74.5 kg)   BMI 29.55 kg/m  General:   Alert and oriented. Pleasant and cooperative. Well-nourished and well-developed.  Head:  Normocephalic and  atraumatic. Eyes:  Without icterus Abdomen:  +BS, soft, non-tender and non-distended. No HSM noted. No guarding or rebound. No masses appreciated.  Rectal:  Deferred  Msk:  Symmetrical without gross deformities. Normal posture. Extremities:  Without edema. Neurologic:  Alert and  oriented x4;  grossly normal neurologically. Skin:  Intact without significant lesions or rashes. Psych:  Alert and cooperative. Normal mood and affect.   Assessment   Susan Baker is a 62 y.o. female presenting today with a history of 62 y.o. female presenting today with a history of GERD, hepatic steatosis, RUQ pain for many years s/p cholecystectomy in the past.    Now noting intermittent fecal incontinence episodes which are most likely due to overflow incontinence. Will start on fiber daily   Hepatic steatosis: ELF, Fibrosure, celiac serologies.     PLAN    Labs as ordered Benefiber each morning MyChart message update with how she is doing   Gelene Mink, PhD, ANP-BC [facility]

## 2023-05-11 ENCOUNTER — Other Ambulatory Visit: Payer: Self-pay | Admitting: Gastroenterology

## 2023-05-13 LAB — SPECIMEN STATUS REPORT

## 2023-05-15 LAB — NASH FIBROSURE(R) PLUS
ALPHA 2-MACROGLOBULINS, QN: 176 mg/dL (ref 110–276)
ALT (SGPT) P5P: 27 IU/L (ref 0–40)
AST (SGOT) P5P: 27 IU/L (ref 0–40)
Apolipoprotein A-1: 179 mg/dL (ref 116–209)
Bilirubin, Total: 0.3 mg/dL (ref 0.0–1.2)
Cholesterol, Total: 165 mg/dL (ref 100–199)
Fibrosis Score: 0.07 (ref 0.00–0.21)
GGT: 17 IU/L (ref 0–60)
Glucose: 93 mg/dL (ref 70–99)
Haptoglobin: 138 mg/dL (ref 37–355)
NASH Score: 0 (ref 0.00–0.25)
Steatosis Score: 0.35 (ref 0.00–0.40)
Triglycerides: 108 mg/dL (ref 0–149)

## 2023-05-15 LAB — ENHANCED LIVER FIBROSIS (ELF): ELF(TM) Score: 8.33 (ref <9.80)

## 2023-05-17 LAB — IGA, IMMUNOGLOBULIN A (RDL): IgA, Immunoglobulin A (RDL): 354 mg/dL — ABNORMAL HIGH (ref 87–352)

## 2023-05-17 LAB — TISSUE TRANSGLUTAMINASE, IGA: Transglutaminase IgA: <2 U/mL (ref 0–3)

## 2023-06-03 ENCOUNTER — Other Ambulatory Visit: Payer: Self-pay | Admitting: Gastroenterology

## 2023-06-06 ENCOUNTER — Ambulatory Visit: Payer: Medicare Other | Admitting: Emergency Medicine

## 2023-07-11 ENCOUNTER — Other Ambulatory Visit: Payer: Self-pay | Admitting: Gastroenterology

## 2023-07-12 ENCOUNTER — Encounter: Payer: Self-pay | Admitting: Gastroenterology

## 2023-08-07 ENCOUNTER — Other Ambulatory Visit: Payer: Self-pay | Admitting: Emergency Medicine

## 2023-08-07 DIAGNOSIS — R042 Hemoptysis: Secondary | ICD-10-CM

## 2023-08-07 DIAGNOSIS — R9389 Abnormal findings on diagnostic imaging of other specified body structures: Secondary | ICD-10-CM

## 2023-08-08 ENCOUNTER — Ambulatory Visit: Payer: Medicare Other | Admitting: Emergency Medicine

## 2023-08-22 ENCOUNTER — Ambulatory Visit: Payer: Medicare Other | Admitting: Emergency Medicine

## 2023-08-24 ENCOUNTER — Ambulatory Visit: Payer: Medicare Other | Admitting: Emergency Medicine

## 2023-08-24 ENCOUNTER — Ambulatory Visit: Payer: Medicare Other

## 2023-08-27 ENCOUNTER — Ambulatory Visit: Payer: Medicare Other | Admitting: Adult Health

## 2023-08-27 ENCOUNTER — Encounter: Payer: Self-pay | Admitting: Adult Health

## 2023-08-27 VITALS — BP 107/73 | HR 81 | Ht 62.5 in | Wt 159.0 lb

## 2023-08-27 DIAGNOSIS — Z90710 Acquired absence of both cervix and uterus: Secondary | ICD-10-CM | POA: Diagnosis not present

## 2023-08-27 DIAGNOSIS — Z86001 Personal history of in-situ neoplasm of cervix uteri: Secondary | ICD-10-CM | POA: Diagnosis not present

## 2023-08-27 DIAGNOSIS — N898 Other specified noninflammatory disorders of vagina: Secondary | ICD-10-CM | POA: Diagnosis not present

## 2023-08-27 DIAGNOSIS — N9089 Other specified noninflammatory disorders of vulva and perineum: Secondary | ICD-10-CM | POA: Diagnosis not present

## 2023-08-27 MED ORDER — PREMARIN 0.625 MG/GM VA CREA: TOPICAL_CREAM | VAGINAL | 0 refills | Status: AC

## 2023-08-27 NOTE — Progress Notes (Signed)
  Subjective:     Patient ID: Susan Baker, female   DOB: 1961/05/28, 63 y.o.   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  Has been diagnosed with lupus. She needs pap.     Component Value Date/Time   DIAGPAP (A) 06/09/2021 1520    - Atypical squamous cells of undetermined significance (ASC-US)   DIAGPAP - Low grade squamous intraepithelial lesion (LSIL) (A) 12/22/2019 1340   DIAGPAP (A) 09/02/2018 0000    LOW GRADE SQUAMOUS INTRAEPITHELIAL LESION: VAIN-1/ HPV (LSIL).   DIAGPAP (A) 09/02/2018 0000    THERE ARE A FEW CELLS SUGGESTIVE OF A HIGHER GRADE LESION. CLINICAL CORRELATION IS RECOMMENDED.   HPVHIGH Negative 06/09/2021 1520   HPVHIGH Negative 12/22/2019 1340   ADEQPAP  06/09/2021 1520    Satisfactory for evaluation; transformation zone component PRESENT.   ADEQPAP Satisfactory for evaluation. 12/22/2019 1340   ADEQPAP Satisfactory for evaluation. (A) 09/02/2018 0000    High Risk HPV: Positive  Adequacy:  Satisfactory for evaluation, transformation zone component PRESENT  Diagnosis:  Atypical squamous cells of undetermined significance (ASC-US)  PCP is Dr Selena Batten  Review of Systems Has noticed lesion in vagina for about 3-4 weeks Feel dry in vagina Not having sex   Reviewed past medical,surgical, social and family history. Reviewed medications and allergies.  Objective:   Physical Exam BP 107/73 (BP Location: Left Arm, Patient Position: Sitting, Cuff Size: Normal)   Pulse 81   Ht 5' 2.5" (1.588 m)   Wt 159 lb (72.1 kg)   BMI 28.62 kg/m     Skin warm and dry, has 3 x 2 cm red flat raw lesion left inner labia,  Vagina is dry,did not try speculum exam   Upstream - 08/27/23 1037       Pregnancy Intention Screening   Does the patient want to become pregnant in the next year? N/A    Does the patient's partner want to become pregnant in the next year? N/A     Would the patient like to discuss contraceptive options today? N/A      Contraception Wrap Up   Current Method Female Sterilization   hyst   End Method Female Sterilization   hyst   Contraception Counseling Provided No            Examination chaperoned by Malachy Mood LPN  Assessment:     1. labial lesion (Primary) Has 3 x 2 cm red raw lesion left inner labia Return in 2 weeks for biopsy   2. S/P hysterectomy  3. Vaginal dryness +dryness Gave 4 sample tubes of premarin vaginal cream to use pea sized about bid for 2 weeks Meds ordered this encounter  Medications   conjugated estrogens (PREMARIN) vaginal cream    Sig: Use pea sized amount bid for 2 weeks    Dispense:  16 g    Refill:  0    Supervising Provider:   Despina Hidden, LUTHER H [[id number]]     4. History of carcinoma in situ of cervix Use PVC for 2 weeks before trying vaginal pap     Plan:     Return in 2 weeks for punch biopsy left labia and pap with Dr Despina Hidden

## 2023-08-28 IMAGING — DX DG CHEST 2V
2 series · 2 of 2 positions shown · non-contrast
Comparison: October 22, 2019

CLINICAL DATA: Cough.  Shortness of breath.

EXAM:
CHEST - 2 VIEW

[chest pa]
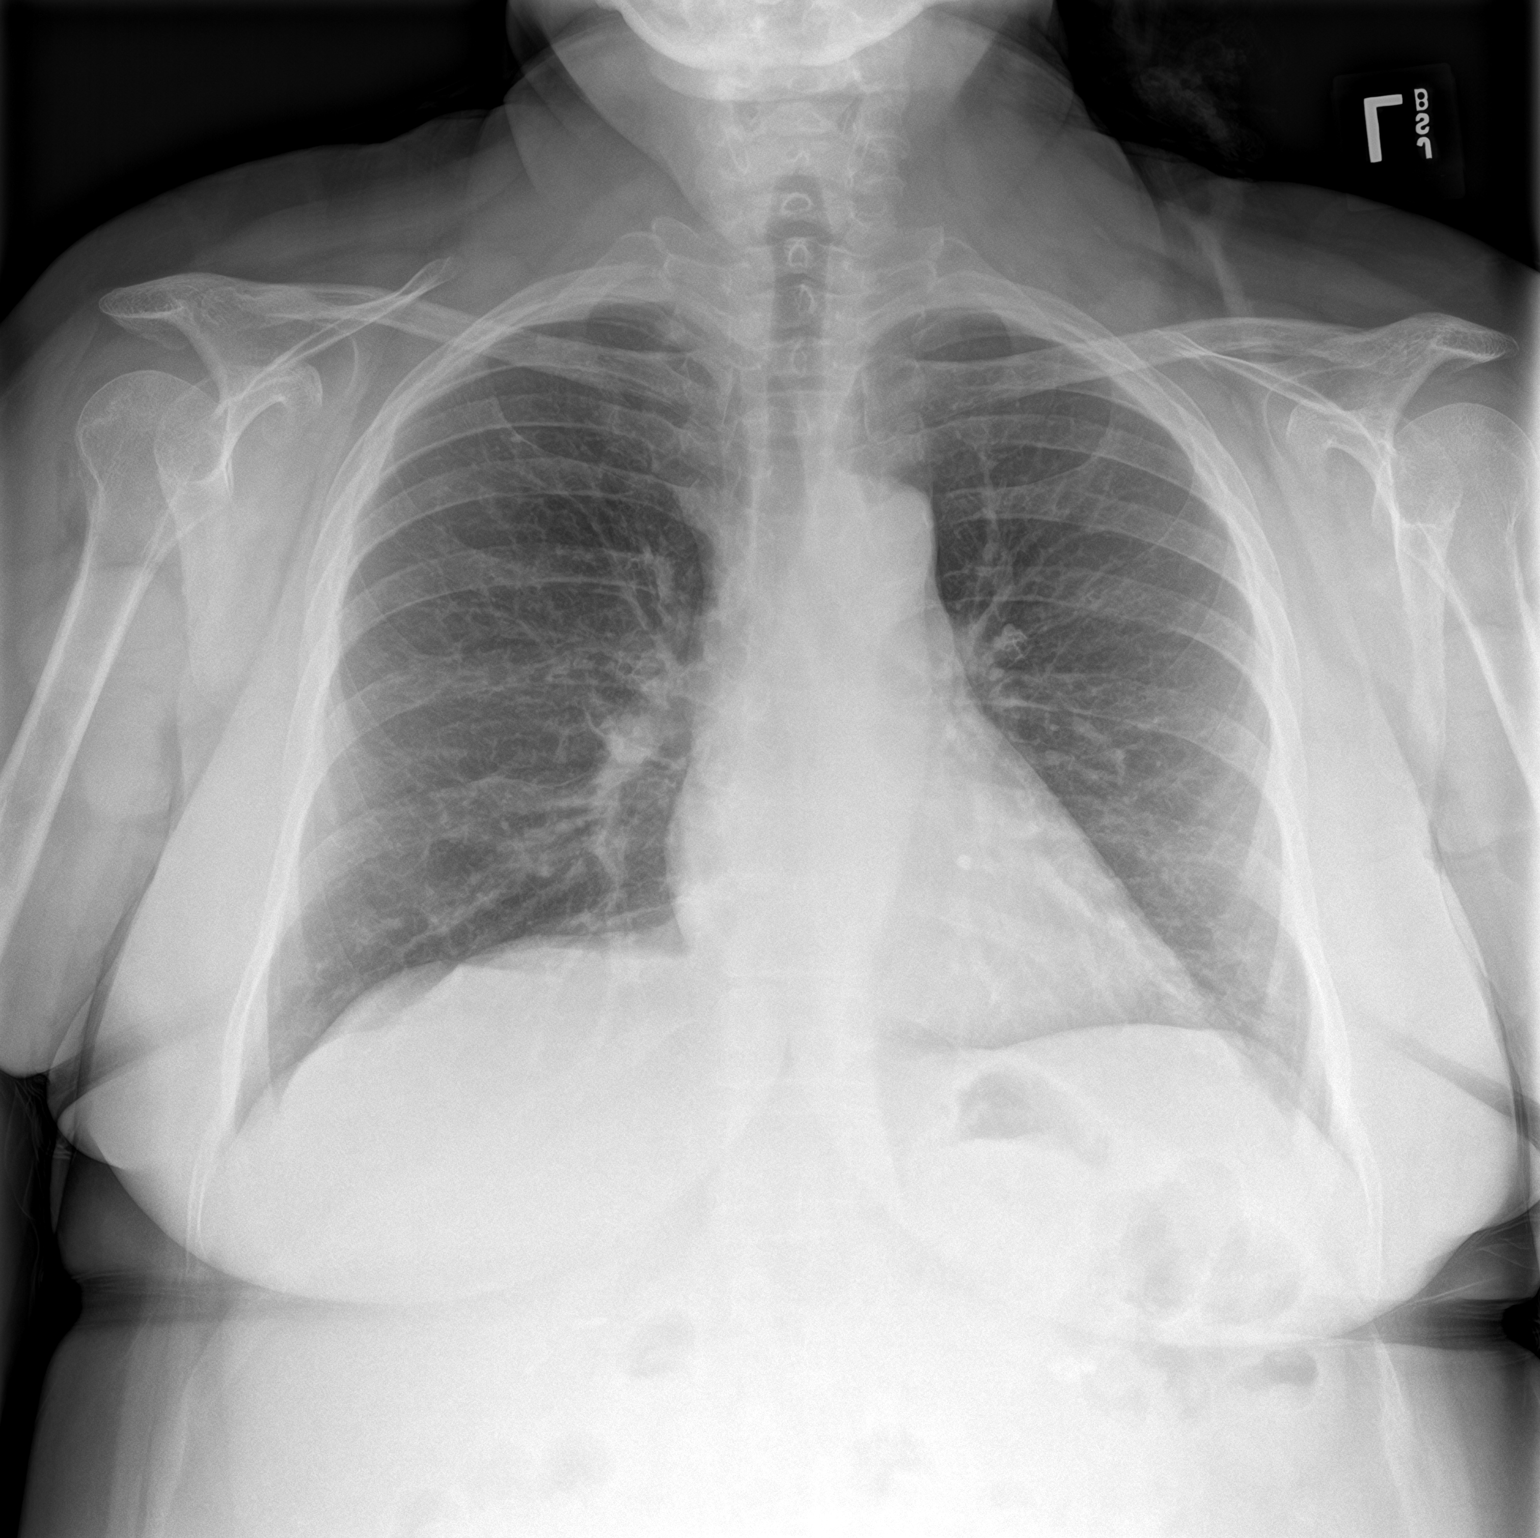

[chest lat]
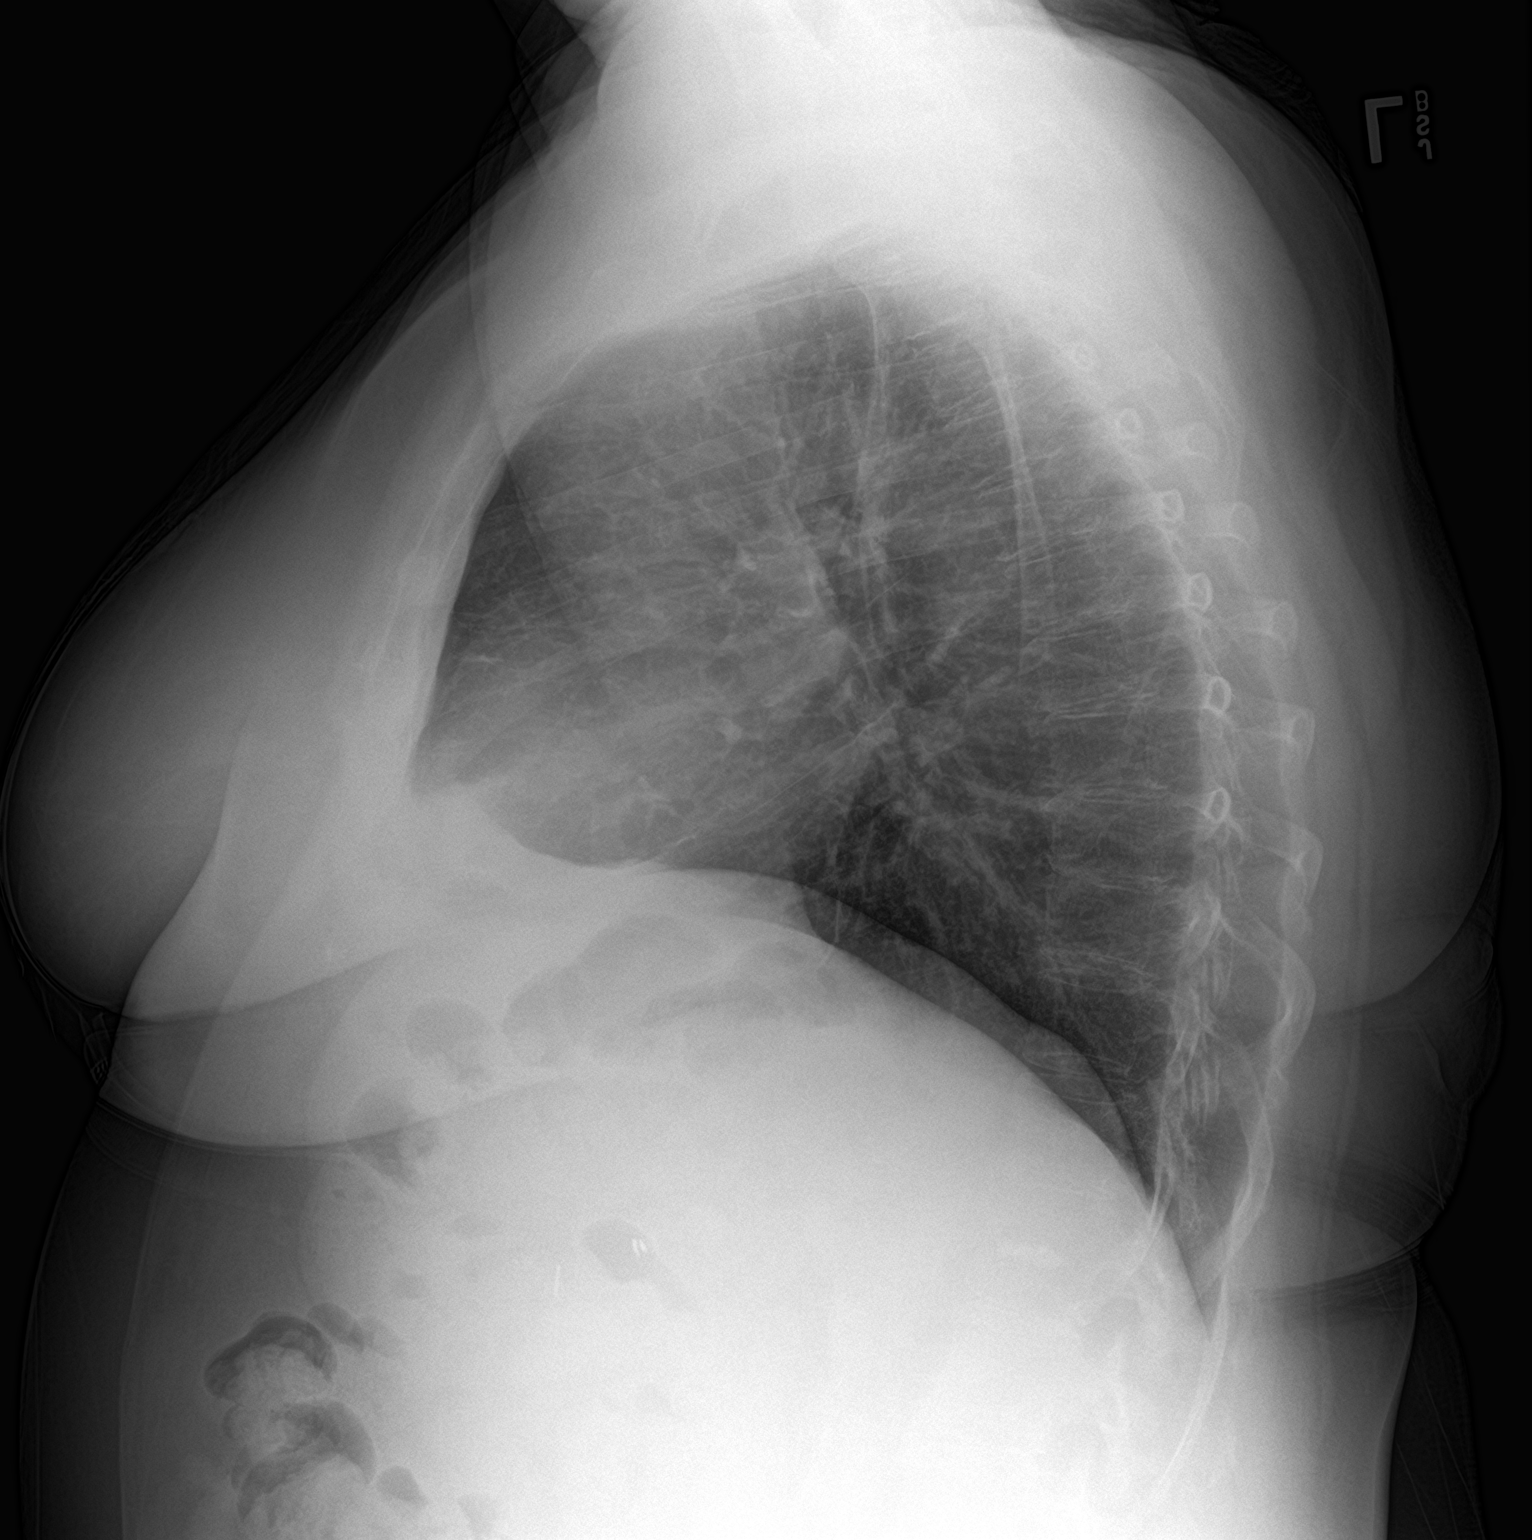

[2 of 2 positions shown; findings below may reference images not displayed]

FINDINGS: Mild hazy opacity in the lateral left lung base only identified on
the frontal view. The heart, hila, mediastinum, lungs, and pleura
are otherwise normal.
IMPRESSION: Minimal hazy opacity in the lateral left lung base may represent
confluence of shadows or atelectasis. Early infiltrate considered
less likely but not completely excluded. Recommend short-term
follow-up imaging to ensure resolution.

## 2023-08-31 ENCOUNTER — Ambulatory Visit: Payer: Medicare Other

## 2023-08-31 ENCOUNTER — Ambulatory Visit: Payer: Medicare Other | Admitting: Emergency Medicine

## 2023-08-31 ENCOUNTER — Encounter: Payer: Self-pay | Admitting: Emergency Medicine

## 2023-08-31 VITALS — BP 135/87 | HR 73 | Temp 97.9°F | Ht 62.5 in | Wt 159.0 lb

## 2023-08-31 DIAGNOSIS — R9389 Abnormal findings on diagnostic imaging of other specified body structures: Secondary | ICD-10-CM | POA: Diagnosis not present

## 2023-08-31 DIAGNOSIS — R042 Hemoptysis: Secondary | ICD-10-CM

## 2023-08-31 DIAGNOSIS — Z87891 Personal history of nicotine dependence: Secondary | ICD-10-CM | POA: Diagnosis not present

## 2023-08-31 DIAGNOSIS — M329 Systemic lupus erythematosus, unspecified: Secondary | ICD-10-CM

## 2023-08-31 NOTE — Assessment & Plan Note (Signed)
 Some subtle right middle lobe and right lower lobe inflammatory change versus atelectasis.  Her chest x-ray today looks more clear.  Again question whether this may relate to her new diagnosis of autoimmune process.  Given her stability, clear chest x-ray I do not think we need to repeat a CT chest right now.  We could reconsider going forward depending on clinical change.  I will follow her annually with a chest x-ray.

## 2023-08-31 NOTE — Patient Instructions (Signed)
 Continue to follow with rheumatology and continue your medications as per their plans. Your chest x-ray today is normal without any significant changes or inflammation.  This is good news. Please follow with Dr. Delton Coombes in 1 year.  Call sooner if you develop any new respiratory symptoms.

## 2023-08-31 NOTE — Assessment & Plan Note (Signed)
 She had been seeing blood-tinged sputum, question whether this was true hemoptysis versus epistaxis.  Bronchoscopy was reassuring.  Regardless it has resolved since she has been treated for autoimmune disease, SLE.  She is on hydroxychloroquine and prednisone.  Chest x-ray is clear.  I do not think we need to perform a CT chest at this time.  Continue to follow her annually.

## 2023-08-31 NOTE — Progress Notes (Signed)
 Subjective:    Patient ID: Susan Baker, female    DOB: 25-Sep-1960, 63 y.o.   MRN: [id number]  HPI  ROV 08/17/22 --63 year old woman with history of former tobacco use, active vape use, intermittent cough with some scant hemoptysis (question a component of epistaxis).  Part of evaluation including a CT scan of the chest that showed a subtle right middle lobe infiltrate.  She underwent inspection bronchoscopy that did not show any source of bleeding.  No cultures were obtained.  Inflammatory changes resolved on her most recent CT from August.  Repeat performed in February as below. Since I last saw her she was diagnosed with influenza. She still has cough and will see pink mucous. No epistaxis. No obvious hematemesis either.   CT scan of the chest 08/10/2022 reviewed by me, shows no recurrence of any inflammatory change.  There is some mild right upper lobe and right middle lobe atelectatic change, mild posterior right lower lobe reticular change.  There is a small 3 mm pulmonary nodule in the right middle lobe that is unchanged going back to 2009  ROV 08/31/2023 --follow-up visit for 63 year old woman whom I have followed for former tobacco use, vape use, intermittent associated cough (with scant hemoptysis, FOB reassuring).  Chest imaging has shown a subtle right middle lobe infiltrate versus atelectasis.  Her ENT evaluation has been reassuring and she does not believe she has epistaxis.  ANCA negative 08/17/2022. Since I last saw her shew has been dx w SLE and has followed w Rheumatology. She was started on hydroxychlorquine and prednisone, remains on 5mg  every day. The blood tinged sputum has resolved, ? Related to the autoimmune dz.   CXR today is clkear, unchanged from prior done 01/26/2023.   Review of Systems As per HPI  Past Medical History:  Diagnosis Date   Abnormal Pap smear    Abnormal ThinPrep Pap test of vagina 04/25/2017   LSIL will get colp__________   Anxiety    Bulging disc     cervical, [facility]   COPD (chronic obstructive pulmonary disease) (HCC)    Depression 02/18/2013   Fibromyalgia    GERD (gastroesophageal reflux disease)    egd with RE per patient, remote   Hematuria 05/14/2014   History of abnormal cervical Pap smear 02/24/2014   Hypertension    Lupus (systemic lupus erythematosus) (HCC)    Osteoarthritis (arthritis due to wear and tear of joints)    Osteoporosis    Osteoporosis, unspecified 03/03/2014   Postcoital bleeding 09/15/2015   Postmenopausal 02/24/2014   PUD (peptic ulcer disease)    bleeding PUD per patient, remote, On Daypro   Urinary frequency 05/14/2014   Uterine cancer (HCC)    Vaginal Pap smear, abnormal      Family History  Problem Relation Age of Onset   Cirrhosis Father        etoh, died with liver cancer   Diabetes Father    Cirrhosis Paternal Uncle        multiple, etoh   Pancreatic cancer Paternal Grandmother    Diabetes Paternal Grandmother    Kidney failure Mother    Diabetes Mother    Other Brother        crohn's disease   Hyperlipidemia Brother    Fibromyalgia Daughter    Diabetes Maternal Grandmother    Healthy Son    Colon cancer Maternal Uncle    Colon polyps Neg Hx      Social History   Socioeconomic History  Marital status: Divorced    Spouse name: Not on file   Number of children: 2   Years of education: Not on file   Highest education level: Not on file  Occupational History   Occupation: disability  Tobacco Use   Smoking status: Former    Current packs/day: 0.00    Average packs/day: 0.5 packs/day for 35.0 years (17.5 ttl pk-yrs)    Types: E-cigarettes, Cigarettes    Start date: 07/22/1984    Quit date: 07/23/2019    Years since quitting: 4.1   Smokeless tobacco: Never   Tobacco comments:    using vape at times  Vaping Use   Vaping status: Every Day   Substances: Nicotine-salt  Substance and Sexual Activity   Alcohol use: Not Currently    Comment: occasional social use     Drug use: No   Sexual activity: Not Currently    Birth control/protection: Surgical    Comment: hysterectomy  Other Topics Concern   Not on file  Social History Narrative   Not on file   Social Drivers of Health   Financial Resource Strain: Not on file  Food Insecurity: No Food Insecurity (12/22/2019)   Hunger Vital Sign    Worried About Running Out of Food in the Last Year: Never true    Ran Out of Food in the Last Year: Never true  Transportation Needs: No Transportation Needs (12/22/2019)   PRAPARE - Administrator, Civil Service (Medical): No    Lack of Transportation (Non-Medical): No  Physical Activity: Sufficiently Active (12/22/2019)   Exercise Vital Sign    Days of Exercise per Week: 5 days    Minutes of Exercise per Session: 30 min  Stress: Stress Concern Present (12/22/2019)   [employer] of Occupational Health - Occupational Stress Questionnaire    Feeling of Stress : Rather much  Social Connections: Moderately Isolated (12/22/2019)   Social Connection and Isolation Panel [NHANES]    Frequency of Communication with Friends and Family: More than three times a week    Frequency of Social Gatherings with Friends and Family: Twice a week    Attends Religious Services: More than 4 times per year    Active Member of [employer] or Organizations: No    Attends Banker Meetings: Never    Marital Status: Divorced  Catering manager Violence: Not At Risk (12/22/2019)   Humiliation, Afraid, Rape, and Kick questionnaire    Fear of Current or Ex-Partner: No    Emotionally Abused: No    Physically Abused: No    Sexually Abused: No     Allergies  Allergen Reactions   Cymbalta [Duloxetine Hcl] Other (See Comments)    Depression, anxiety, wt gain   Daypro [Oxaprozin] Other (See Comments)    Ulcers   Sulfa Antibiotics Other (See Comments)    Bactrim-Could not function   Trazodone And Nefazodone Other (See Comments)    Could not function   Nucynta  [Tapentadol] Other (See Comments)    Memory loss and slurred speech     Outpatient Medications Prior to Visit  Medication Sig Dispense Refill   conjugated estrogens (PREMARIN) vaginal cream Use pea sized amount bid for 2 weeks 16 g 0   FLUoxetine (PROZAC) 40 MG capsule Take 40 mg by mouth daily.     fluticasone (FLONASE) 50 MCG/ACT nasal spray Spray 2 sprays every day by intranasal route.     hydroxychloroquine (PLAQUENIL) 200 MG tablet Take by mouth daily.  MAGNESIUM PO Take by mouth.     methocarbamol (ROBAXIN) 500 MG tablet Take 500 mg by mouth every 8 (eight) hours as needed for muscle spasms.     ondansetron (ZOFRAN-ODT) 4 MG disintegrating tablet DISSOLVE 1 TABLET IN MOUTH EVERY 8 HOURS AS NEEDED FOR NAUSEA FOR VOMITING 30 tablet 0   pantoprazole (PROTONIX) 40 MG tablet Take 1 tablet by mouth once daily 270 tablet 0   predniSONE (DELTASONE) 10 MG tablet Take 5 mg by mouth daily with breakfast.     pregabalin (LYRICA) 150 MG capsule Take 150 mg by mouth 2 (two) times daily.     VITAMIN D PO Take by mouth.     lisinopril (ZESTRIL) 10 MG tablet Take 10 mg by mouth daily. (Patient not taking: Reported on 08/31/2023)     No facility-administered medications prior to visit.         Objective:   Physical Exam Vitals:   08/31/23 0958  BP: 135/87  Pulse: 73  Temp: 97.9 F (36.6 C)  TempSrc: Oral  SpO2: 97%  Weight: 159 lb (72.1 kg)  Height: 5' 2.5" (1.588 m)   Gen: Pleasant, well-nourished, in no distress,  normal affect  ENT: No lesions,  mouth clear,  oropharynx clear, no postnasal drip.   Neck: No JVD, no stridor  Lungs: No use of accessory muscles, no crackles or wheezing on normal respiration, no wheeze on forced expiration  Cardiovascular: RRR, heart sounds normal, no murmur or gallops, no peripheral edema  Musculoskeletal: No deformities, no cyanosis or clubbing  Neuro: alert, awake, non focal  Skin: Warm, no lesions or rash      Assessment & Plan:   Hemoptysis She had been seeing blood-tinged sputum, question whether this was true hemoptysis versus epistaxis.  Bronchoscopy was reassuring.  Regardless it has resolved since she has been treated for autoimmune disease, SLE.  She is on hydroxychloroquine and prednisone.  Chest x-ray is clear.  I do not think we need to perform a CT chest at this time.  Continue to follow her annually.  Abnormal CT of the chest Some subtle right middle lobe and right lower lobe inflammatory change versus atelectasis.  Her chest x-ray today looks more clear.  Again question whether this may relate to her new diagnosis of autoimmune process.  Given her stability, clear chest x-ray I do not think we need to repeat a CT chest right now.  We could reconsider going forward depending on clinical change.  I will follow her annually with a chest x-ray.    Levy Pupa, MD, PhD 08/31/2023, 10:14 AM [facility] [phone] or if no answer before 7:00PM call [phone] For any issues after 7:00PM please call eLink [phone]

## 2023-09-06 ENCOUNTER — Ambulatory Visit: Payer: Medicare Other | Admitting: Gastroenterology

## 2023-09-06 ENCOUNTER — Encounter: Payer: Self-pay | Admitting: Gastroenterology

## 2023-09-10 ENCOUNTER — Encounter: Payer: Self-pay | Admitting: Obstetrics & Gynecology

## 2023-09-10 ENCOUNTER — Other Ambulatory Visit (HOSPITAL_COMMUNITY)
Admission: RE | Admit: 2023-09-10 | Discharge: 2023-09-10 | Disposition: A | Discharge status: Home / Self Care | Source: Ambulatory Visit | Attending: Obstetrics & Gynecology | Admitting: Obstetrics & Gynecology

## 2023-09-10 ENCOUNTER — Ambulatory Visit: Payer: Medicare Other | Admitting: Obstetrics & Gynecology

## 2023-09-10 VITALS — BP 116/80 | HR 83 | Ht 62.5 in | Wt 159.0 lb

## 2023-09-10 DIAGNOSIS — N9089 Other specified noninflammatory disorders of vulva and perineum: Secondary | ICD-10-CM | POA: Insufficient documentation

## 2023-09-10 NOTE — Addendum Note (Signed)
 Addended by: Annamarie Dawley on: 09/10/2023 04:16 PM   Modules accepted: Orders

## 2023-09-10 NOTE — Progress Notes (Signed)
 Patient ID: Susan Baker, female   DOB: 1961-01-31, 63 y.o.   MRN: [id number]  Chief Complaint  Patient presents with   Procedure    HPI Susan Baker is a 63 y.o. female.   HPI Indication: erythematous tender lesion left labia minora of the vulva Symptoms:   more painful than pruritic and none  Location:  labia minora on the left  Past Medical History:  Diagnosis Date   Abnormal Pap smear    Abnormal ThinPrep Pap test of vagina 04/25/2017   LSIL will get colp__________   Anxiety    Bulging disc    cervical, [facility]   COPD (chronic obstructive pulmonary disease) (HCC)    Depression 02/18/2013   Fibromyalgia    GERD (gastroesophageal reflux disease)    egd with RE per patient, remote   Hematuria 05/14/2014   History of abnormal cervical Pap smear 02/24/2014   Hypertension    Lupus (systemic lupus erythematosus) (HCC)    Osteoarthritis (arthritis due to wear and tear of joints)    Osteoporosis    Osteoporosis, unspecified 03/03/2014   Postcoital bleeding 09/15/2015   Postmenopausal 02/24/2014   PUD (peptic ulcer disease)    bleeding PUD per patient, remote, On Daypro   Urinary frequency 05/14/2014   Uterine cancer (HCC)    Vaginal Pap smear, abnormal     Past Surgical History:  Procedure Laterality Date   BIOPSY  01/18/2022   Procedure: BIOPSY;  Surgeon: Corbin Ade, MD;  Location: [facility];  Service: Endoscopy;;   BRONCHIAL BIOPSY  01/26/2022   Procedure: BRONCHIAL BIOPSIES;  Surgeon: Leslye Peer, MD;  Location: [facility] ENDOSCOPY;  Service: Cardiopulmonary;;   BRONCHIAL BRUSHINGS  01/26/2022   Procedure: BRONCHIAL BRUSHINGS;  Surgeon: Leslye Peer, MD;  Location[facility] ENDOSCOPY;  Service: Cardiopulmonary;;   BRONCHIAL WASHINGS  01/26/2022   Procedure: BRONCHIAL WASHINGS;  Surgeon: Leslye Peer, MD;  Location: [facility] ENDOSCOPY;  Service: Cardiopulmonary;;   CHOLECYSTECTOMY N/A 11/07/2019   Procedure: LAPAROSCOPIC CHOLECYSTECTOMY;  Surgeon: Lucretia Roers,  MD;  Location: [facility] ORS;  Service: General;  Laterality: N/A;   COLONOSCOPY WITH PROPOFOL N/A 11/22/2015   Dr.Rourk- the entire examined colon is normal, the examined portion of the ileum was normal, non-bleeding internal hemorrhoids. no specimens collected.    ECTOPIC PREGNANCY SURGERY     twice   ESOPHAGEAL DILATION N/A 11/22/2015   Procedure: ESOPHAGEAL DILATION;  Surgeon: Corbin Ade, MD;  Location: [facility];  Service: Endoscopy;  Laterality: N/A;   ESOPHAGOGASTRODUODENOSCOPY  12/01/2010   Dr. Jena Gauss: mild distal ERE, antral erosions due to NSAIDS, no H.Pylori   ESOPHAGOGASTRODUODENOSCOPY (EGD) WITH PROPOFOL N/A 11/22/2015   Dr.Rourk- grade A esophagitis, dilated. small hiatal hernia, normal stomach, normal second portion of the duodenum.   ESOPHAGOGASTRODUODENOSCOPY (EGD) WITH PROPOFOL N/A 10/27/2019   normal esophagus, normal stomach, normal duodenum.    ESOPHAGOGASTRODUODENOSCOPY (EGD) WITH PROPOFOL N/A 01/18/2022   Procedure: ESOPHAGOGASTRODUODENOSCOPY (EGD) WITH PROPOFOL;  Surgeon: Corbin Ade, MD;  Location: [facility];  Service: Endoscopy;  Laterality: N/A;  12:45pm   hysterectomy for uterine cancer     partial   VIDEO BRONCHOSCOPY N/A 01/26/2022   Procedure: VIDEO BRONCHOSCOPY WITHOUT FLUORO;  Surgeon: Leslye Peer, MD;  Location: [facility] ENDOSCOPY;  Service: Cardiopulmonary;  Laterality: N/A;    Family History  Problem Relation Age of Onset   Cirrhosis Father        etoh, died with liver cancer   Diabetes Father  Cirrhosis Paternal Uncle        multiple, etoh   Pancreatic cancer Paternal Grandmother    Diabetes Paternal Grandmother    Kidney failure Mother    Diabetes Mother    Other Brother        crohn's disease   Hyperlipidemia Brother    Fibromyalgia Daughter    Diabetes Maternal Grandmother    Healthy Son    Colon cancer Maternal Uncle    Colon polyps Neg Hx     Social History Social History   Tobacco Use   Smoking status: Former    Current  packs/day: 0.00    Average packs/day: 0.5 packs/day for 35.0 years (17.5 ttl pk-yrs)    Types: E-cigarettes, Cigarettes    Start date: 07/22/1984    Quit date: 07/23/2019    Years since quitting: 4.1   Smokeless tobacco: Never   Tobacco comments:    using vape at times  Vaping Use   Vaping status: Every Day   Substances: Nicotine-salt  Substance Use Topics   Alcohol use: Not Currently    Comment: occasional social use    Drug use: No    Allergies  Allergen Reactions   Cymbalta [Duloxetine Hcl] Other (See Comments)    Depression, anxiety, wt gain   Daypro [Oxaprozin] Other (See Comments)    Ulcers   Sulfa Antibiotics Other (See Comments)    Bactrim-Could not function   Trazodone And Nefazodone Other (See Comments)    Could not function   Nucynta [Tapentadol] Other (See Comments)    Memory loss and slurred speech    Current Outpatient Medications  Medication Sig Dispense Refill   conjugated estrogens (PREMARIN) vaginal cream Use pea sized amount bid for 2 weeks 16 g 0   FLUoxetine (PROZAC) 40 MG capsule Take 40 mg by mouth daily.     fluticasone (FLONASE) 50 MCG/ACT nasal spray Spray 2 sprays every day by intranasal route.     hydroxychloroquine (PLAQUENIL) 200 MG tablet Take by mouth daily.     MAGNESIUM PO Take by mouth.     methocarbamol (ROBAXIN) 500 MG tablet Take 500 mg by mouth every 8 (eight) hours as needed for muscle spasms.     ondansetron (ZOFRAN-ODT) 4 MG disintegrating tablet DISSOLVE 1 TABLET IN MOUTH EVERY 8 HOURS AS NEEDED FOR NAUSEA FOR VOMITING 30 tablet 0   pantoprazole (PROTONIX) 40 MG tablet Take 1 tablet by mouth once daily 270 tablet 0   predniSONE (DELTASONE) 10 MG tablet Take 5 mg by mouth daily with breakfast.     pregabalin (LYRICA) 150 MG capsule Take 150 mg by mouth 2 (two) times daily.     VITAMIN D PO Take by mouth.     lisinopril (ZESTRIL) 10 MG tablet Take 10 mg by mouth daily. (Patient not taking: Reported on 08/31/2023)     No current  facility-administered medications for this visit.    Review of Systems Review of Systems  Blood pressure 116/80, pulse 83, height 5' 2.5" (1.588 m), weight 159 lb (72.1 kg).  Physical Exam Physical Exam  Data Reviewed   Assessment   Prepping with Betadine Local anesthesia with 1% Buffered Lidocaine 4  mm punch biopsy performed per protocol 3-0 chromic figure of 8 suture placed with good hemostasis Well tolerated  Specimen appropriately identified and sent to pathology    Plan    MYChart follow up      Lazaro Arms 09/10/2023, 3:17 PM

## 2023-09-18 LAB — SURGICAL PATHOLOGY

## 2023-09-19 ENCOUNTER — Encounter: Payer: Self-pay | Admitting: Obstetrics & Gynecology

## 2023-09-19 ENCOUNTER — Telehealth: Admitting: Obstetrics & Gynecology

## 2023-09-19 DIAGNOSIS — N763 Subacute and chronic vulvitis: Secondary | ICD-10-CM

## 2023-09-19 MED ORDER — CLOBETASOL PROPIONATE 0.05 % EX CREA: 1.0000 | TOPICAL_CREAM | Freq: Two times a day (BID) | CUTANEOUS | 3 refills | Status: DC

## 2023-09-19 NOTE — Progress Notes (Signed)
 Mychart connect visit: video + audio Pt is at her home I am in my office Total time: 10 minutes    Follow up appointment for results: Vulvar biopsy  Chief Complaint  Patient presents with   Follow-up    Pathology results    There were no vitals taken for this visit.   SURGICAL PATHOLOGY CASE: [id number] PATIENT: Susan Baker Surgical Pathology Report     Clinical History: vulvar lesion, hyperemic, tender, no itching (cm)     FINAL MICROSCOPIC DIAGNOSIS:  A. LABIA MINORA, LEFT, BIOPSY: -  Acute and chronic vulvitis.   Note: There is no morphologic evidence of dysplasia.  P53 is wild-type. Ki-67 is basal in location and there is weak patchy p16 stain that is nonspecific.  Dr. Reynolds Bowl has peer-reviewed the case and agrees with the interpretation.    MEDS ordered this encounter: Meds ordered this encounter  Medications   clobetasol cream (TEMOVATE) 0.05 %    Sig: Apply 1 Application topically 2 (two) times daily.    Dispense:  30 g    Refill:  3    Orders for this encounter: No orders of the defined types were placed in this encounter.   Impression + Management Plan   ICD-10-CM   1. 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     All questions were answered.  Past Medical History:  Diagnosis Date   Abnormal Pap smear    Abnormal ThinPrep Pap test of vagina 04/25/2017   LSIL will get colp__________   Anxiety    Bulging disc    cervical, [facility]   COPD (chronic obstructive pulmonary disease) (HCC)    Depression 02/18/2013   Fibromyalgia    GERD (gastroesophageal reflux disease)    egd with RE per patient, remote   Hematuria 05/14/2014   History of abnormal cervical Pap smear 02/24/2014   Hypertension    Lupus (systemic lupus erythematosus) (HCC)    Osteoarthritis (arthritis due to wear and tear of  joints)    Osteoporosis    Osteoporosis, unspecified 03/03/2014   Postcoital bleeding 09/15/2015   Postmenopausal 02/24/2014   PUD (peptic ulcer disease)    bleeding PUD per patient, remote, On Daypro   Urinary frequency 05/14/2014   Uterine cancer (HCC)    Vaginal Pap smear, abnormal     Past Surgical History:  Procedure Laterality Date   BIOPSY  01/18/2022   Procedure: BIOPSY;  Surgeon: Corbin Ade, MD;  Location: [facility];  Service: Endoscopy;;   BRONCHIAL BIOPSY  01/26/2022   Procedure: BRONCHIAL BIOPSIES;  Surgeon: Leslye Peer, MD;  Location: [facility] ENDOSCOPY;  Service: Cardiopulmonary;;   BRONCHIAL BRUSHINGS  01/26/2022   Procedure: BRONCHIAL BRUSHINGS;  Surgeon: Leslye Peer, MD;  Location: [facility] ENDOSCOPY;  Service: Cardiopulmonary;;   BRONCHIAL WASHINGS  01/26/2022   Procedure: BRONCHIAL WASHINGS;  Surgeon: Leslye Peer, MD;  Location: [facility] ENDOSCOPY;  Service: Cardiopulmonary;;   CHOLECYSTECTOMY N/A 11/07/2019   Procedure: LAPAROSCOPIC CHOLECYSTECTOMY;  Surgeon: Lucretia Roers, MD;  Location: [facility] OR[facility];  Service: General;  Laterality: N/A;   COLONOSCOPY WITH PROPOFOL N/A 11/22/2015   Dr.Rourk- the entire examined colon is normal, the examined portion of the ileum was normal, non-bleeding internal hemorrhoids. no specimens collected.    ECTOPIC PREGNANCY SURGERY     twice   ESOPHAGEAL DILATION N/A 11/22/2015  Procedure: ESOPHAGEAL DILATION;  Surgeon: Corbin Ade, MD;  [facility];  Service: Endoscopy;  Laterality: N/A;   ESOPHAGOGASTRODUODENOSCOPY  12/01/2010   Dr. Jena Gauss: mild distal ERE, antral erosions due to NSAIDS, no H.Pylori   ESOPHAGOGASTRODUODENOSCOPY (EGD) WITH PROPOFOL N/A 11/22/2015   Dr.Rourk- grade A esophagitis, dilated. small hiatal hernia, normal stomach, normal second portion of the duodenum.   ESOPHAGOGASTRODUODENOSCOPY (EGD) WITH PROPOFOL N/A 10/27/2019   normal esophagus, normal stomach, normal duodenum.    ESOPHAGOGASTRODUODENOSCOPY  (EGD) WITH PROPOFOL N/A 01/18/2022   Procedure: ESOPHAGOGASTRODUODENOSCOPY (EGD) WITH PROPOFOL;  Surgeon: Corbin Ade, MD;  Location: [facility];  Service: Endoscopy;  Laterality: N/A;  12:45pm   hysterectomy for uterine cancer     partial   VIDEO BRONCHOSCOPY N/A 01/26/2022   Procedure: VIDEO BRONCHOSCOPY WITHOUT FLUORO;  Surgeon: Leslye Peer, MD;  Location: [facility] ENDOSCOPY;  Service: Cardiopulmonary;  Laterality: N/A;    OB History     Gravida  4   Para  2   Term  2   Preterm      AB  2   Living  2      SAB      IAB      Ectopic  2   Multiple      Live Births  2           Allergies  Allergen Reactions   Cymbalta [Duloxetine Hcl] Other (See Comments)    Depression, anxiety, wt gain   Daypro [Oxaprozin] Other (See Comments)    Ulcers   Sulfa Antibiotics Other (See Comments)    Bactrim-Could not function   Trazodone And Nefazodone Other (See Comments)    Could not function   Nucynta [Tapentadol] Other (See Comments)    Memory loss and slurred speech    Social History   Socioeconomic History   Marital status: Divorced    Spouse name: Not on file   Number of children: 2   Years of education: Not on file   Highest education level: Not on file  Occupational History   Occupation: disability  Tobacco Use   Smoking status: Former    Current packs/day: 0.00    Average packs/day: 0.5 packs/day for 35.0 years (17.5 ttl pk-yrs)    Types: E-cigarettes, Cigarettes    Start date: 07/22/1984    Quit date: 07/23/2019    Years since quitting: 4.1   Smokeless tobacco: Never   Tobacco comments:    using vape at times  Vaping Use   Vaping status: Every Day   Substances: Nicotine-salt  Substance and Sexual Activity   Alcohol use: Not Currently    Comment: occasional social use    Drug use: No   Sexual activity: Not Currently    Birth control/protection: Surgical    Comment: hysterectomy  Other Topics Concern   Not on file  Social History Narrative    Not on file   Social Drivers of Health   Financial Resource Strain: Not on file  Food Insecurity: No Food Insecurity (12/22/2019)   Hunger Vital Sign    Worried About Running Out of Food in the Last Year: Never true    Ran Out of Food in the Last Year: Never true  Transportation Needs: No Transportation Needs (12/22/2019)   PRAPARE - Administrator, Civil Service (Medical): No    Lack of Transportation (Non-Medical): No  Physical Activity: Sufficiently Active (12/22/2019)   Exercise Vital Sign    Days of Exercise  per Week: 5 days    Minutes of Exercise per Session: 30 min  Stress: Stress Concern Present (12/22/2019)   [employer] of Occupational Health - Occupational Stress Questionnaire    Feeling of Stress : Rather much  Social Connections: Moderately Isolated (12/22/2019)   Social Connection and Isolation Panel [NHANES]    Frequency of Communication with Friends and Family: More than three times a week    Frequency of Social Gatherings with Friends and Family: Twice a week    Attends Religious Services: More than 4 times per year    Active Member of [employer] or Organizations: No    Attends Engineer, structural: Never    Marital Status: Divorced    Family History  Problem Relation Age of Onset   Cirrhosis Father        etoh, died with liver cancer   Diabetes Father    Cirrhosis Paternal Uncle        multiple, etoh   Pancreatic cancer Paternal Grandmother    Diabetes Paternal Grandmother    Kidney failure Mother    Diabetes Mother    Other Brother        crohn's disease   Hyperlipidemia Brother    Fibromyalgia Daughter    Diabetes Maternal Grandmother    Healthy Son    Colon cancer Maternal Uncle    Colon polyps Neg Hx

## 2023-10-02 ENCOUNTER — Encounter: Payer: Self-pay | Admitting: Gastroenterology

## 2023-10-02 ENCOUNTER — Ambulatory Visit: Admitting: Gastroenterology

## 2023-10-08 ENCOUNTER — Ambulatory Visit: Admitting: Obstetrics & Gynecology

## 2023-10-31 ENCOUNTER — Encounter: Payer: Self-pay | Admitting: Obstetrics & Gynecology

## 2023-11-01 ENCOUNTER — Ambulatory Visit: Admitting: Obstetrics & Gynecology

## 2023-11-01 ENCOUNTER — Encounter: Payer: Self-pay | Admitting: Obstetrics & Gynecology

## 2023-11-01 VITALS — BP 114/74 | HR 89

## 2023-11-01 DIAGNOSIS — N763 Subacute and chronic vulvitis: Secondary | ICD-10-CM | POA: Diagnosis not present

## 2023-11-01 NOTE — Progress Notes (Signed)
 Follow up appointment vulvitis  Chief Complaint  Patient presents with   Procedure    Blood pressure 114/74, pulse 89.  Area on the left vulva is a little larger taking the temovate  0.05% twice daily It is relatively large and I think would respond better to laser abltion than excision Also a "kissing" lesion on the right Less tender on the steroid cream    MEDS ordered this encounter: No orders of the defined types were placed in this encounter.   Orders for this encounter: No orders of the defined types were placed in this encounter.   Impression + Management Plan   ICD-10-CM   1. 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     All questions were answered.  Past Medical History:  Diagnosis Date   Abnormal Pap smear    Abnormal ThinPrep Pap test of vagina 04/25/2017   LSIL will get colp__________   Anxiety    Bulging disc    cervical, [facility]   COPD (chronic obstructive pulmonary disease) (HCC)    Depression 02/18/2013   Fibromyalgia    GERD (gastroesophageal reflux disease)    egd with RE per patient, remote   Hematuria 05/14/2014   History of abnormal cervical Pap smear 02/24/2014   Hypertension    Lupus (systemic lupus erythematosus) (HCC)    Osteoarthritis (arthritis due to wear and tear of joints)    Osteoporosis    Osteoporosis, unspecified 03/03/2014   Postcoital bleeding 09/15/2015   Postmenopausal 02/24/2014   PUD (peptic ulcer disease)    bleeding PUD per patient, remote, On Daypro   Urinary frequency 05/14/2014   Uterine cancer (HCC)    Vaginal Pap smear, abnormal     Past Surgical History:  Procedure Laterality Date   BIOPSY  01/18/2022   Procedure: BIOPSY;  Surgeon: Suzette Espy, MD;   Location: [facility];  Service: Endoscopy;;   BRONCHIAL BIOPSY  01/26/2022   Procedure: BRONCHIAL BIOPSIES;  Surgeon: Denson Flake, MD;  Location: [facility] ENDOSCOPY;  Service: Cardiopulmonary;;   BRONCHIAL BRUSHINGS  01/26/2022   Procedure: BRONCHIAL BRUSHINGS;  Surgeon: Denson Flake, MD;  Location: [facility] ENDOSCOPY;  Service: Cardiopulmonary;;   BRONCHIAL WASHINGS  01/26/2022   Procedure: BRONCHIAL WASHINGS;  Surgeon: Denson Flake, MD;  Location: [facility] ENDOSCOPY;  Service: Cardiopulmonary;;   CHOLECYSTECTOMY N/A 11/07/2019   Procedure: LAPAROSCOPIC CHOLECYSTECTOMY;  Surgeon: Awilda Bogus, MD;  Location: [facility] OR[facility];  Service: General;  Laterality: N/A;   COLONOSCOPY WITH PROPOFOL  N/A 11/22/2015   Dr.Rourk- the entire examined colon is normal, the examined portion of the ileum was normal, non-bleeding internal hemorrhoids. no specimens collected.    ECTOPIC PREGNANCY SURGERY     twice   ESOPHAGEAL DILATION N/A 11/22/2015   Procedure: ESOPHAGEAL DILATION;  Surgeon: Suzette Espy, MD;  Location: [facility];  Service: Endoscopy;  Laterality: N/A;   ESOPHAGOGASTRODUODENOSCOPY  12/01/2010   Dr. Riley Cheadle: mild distal ERE, antral erosions due to NSAIDS, no H.Pylori   ESOPHAGOGASTRODUODENOSCOPY (EGD) WITH PROPOFOL  N/A 11/22/2015   Dr.Rourk- grade A esophagitis, dilated. small hiatal hernia, normal stomach, normal second portion of the duodenum.   ESOPHAGOGASTRODUODENOSCOPY (EGD) WITH PROPOFOL  N/A 10/27/2019   normal esophagus, normal stomach, normal duodenum.  ESOPHAGOGASTRODUODENOSCOPY (EGD) WITH PROPOFOL  N/A 01/18/2022   Procedure: ESOPHAGOGASTRODUODENOSCOPY (EGD) WITH PROPOFOL ;  Surgeon: Suzette Espy, MD;  Location: [facility];  Service: Endoscopy;  Laterality: N/A;  12:45pm   hysterectomy for uterine cancer     partial   VIDEO BRONCHOSCOPY N/A 01/26/2022   Procedure: VIDEO BRONCHOSCOPY WITHOUT FLUORO;  Surgeon: Denson Flake, MD;  Location: [facility] ENDOSCOPY;  Service: Cardiopulmonary;  Laterality:  N/A;    OB History     Gravida  4   Para  2   Term  2   Preterm      AB  2   Living  2      SAB      IAB      Ectopic  2   Multiple      Live Births  2           Allergies  Allergen Reactions   Cymbalta [Duloxetine Hcl] Other (See Comments)    Depression, anxiety, wt gain   Daypro [Oxaprozin] Other (See Comments)    Ulcers   Sulfa  Antibiotics Other (See Comments)    Bactrim -Could not function   Trazodone And Nefazodone Other (See Comments)    Could not function   Nucynta [Tapentadol] Other (See Comments)    Memory loss and slurred speech    Social History   Socioeconomic History   Marital status: Divorced    Spouse name: Not on file   Number of children: 2   Years of education: Not on file   Highest education level: Not on file  Occupational History   Occupation: disability  Tobacco Use   Smoking status: Former    Current packs/day: 0.00    Average packs/day: 0.5 packs/day for 35.0 years (17.5 ttl pk-yrs)    Types: E-cigarettes, Cigarettes    Start date: 07/22/1984    Quit date: 07/23/2019    Years since quitting: 4.2   Smokeless tobacco: Never   Tobacco comments:    using vape at times  Vaping Use   Vaping status: Every Day   Substances: Nicotine-salt  Substance and Sexual Activity   Alcohol use: Not Currently    Comment: occasional social use    Drug use: No   Sexual activity: Not Currently    Birth control/protection: Surgical    Comment: hysterectomy  Other Topics Concern   Not on file  Social History Narrative   Not on file   Social Drivers of Health   Financial Resource Strain: Not on file  Food Insecurity: No Food Insecurity (12/22/2019)   Hunger Vital Sign    Worried About Running Out of Food in the Last Year: Never true    Ran Out of Food in the Last Year: Never true  Transportation Needs: No Transportation Needs (12/22/2019)   PRAPARE - Administrator, Civil Service (Medical): No    Lack of Transportation  (Non-Medical): No  Physical Activity: Sufficiently Active (12/22/2019)   Exercise Vital Sign    Days of Exercise per Week: 5 days    Minutes of Exercise per Session: 30 min  Stress: Stress Concern Present (12/22/2019)   [employer] of Occupational Health - Occupational Stress Questionnaire    Feeling of Stress : Rather much  Social Connections: Moderately Isolated (12/22/2019)   Social Connection and Isolation Panel [NHANES]    Frequency of Communication with Friends and Family: More than three times a week    Frequency of Social Gatherings with Friends and Family: Twice a week  Attends Religious Services: More than 4 times per year    Active Member of Clubs or Organizations: No    Attends Banker Meetings: Never    Marital Status: Divorced    Family History  Problem Relation Age of Onset   Cirrhosis Father        etoh, died with liver cancer   Diabetes Father    Cirrhosis Paternal Uncle        multiple, etoh   Pancreatic cancer Paternal Grandmother    Diabetes Paternal Grandmother    Kidney failure Mother    Diabetes Mother    Other Brother        crohn's disease   Hyperlipidemia Brother    Fibromyalgia Daughter    Diabetes Maternal Grandmother    Healthy Son    Colon cancer Maternal Uncle    Colon polyps Neg Hx

## 2023-11-05 ENCOUNTER — Encounter: Payer: Self-pay | Admitting: Obstetrics & Gynecology

## 2023-12-06 NOTE — Patient Instructions (Signed)
 Susan Baker  12/06/2023     @PREFPERIOPPHARMACY @   Your procedure is scheduled on  12/12/2023.   Report to [facility] at  1100 A.M.   Call this number if you have problems the morning of surgery:  [phone]  If you experience any cold or flu symptoms such as cough, fever, chills, shortness of breath, etc. between now and your scheduled surgery, please notify us  at the above number.   Remember:  Do not eat after midnight.    You may drink clear liquids until  0900 am on 12/12/2023.    Clear liquids allowed are:                    Water, Juice (No red color; non-citric and without pulp; diabetics please choose diet or no sugar options), Carbonated beverages (diabetics please choose diet or no sugar options), Clear Tea (No creamer, milk, or cream, including half & half and powdered creamer), Black Coffee Only (No creamer, milk or cream, including half & half and powdered creamer), and Clear Sports drink (No red color; diabetics please choose diet or no sugar options)           At 0900 am on 12/12/2023 drink your carb drink. You can have nothing else after this.    Take these medicines the morning of surgery with A SIP OF WATER                                            fluoxetine, pregabalin.    Do not wear jewelry, make-up or nail polish, including gel polish,  artificial nails, or any other type of covering on natural nails (fingers and  toes).  Do not wear lotions, powders, or perfumes, or deodorant.  Do not shave 48 hours prior to surgery.  Men may shave face and neck.  Do not bring valuables to the hospital.  [facility] is not responsible for any belongings or valuables.  Contacts, dentures or bridgework may not be worn into surgery.  Leave your suitcase in the car.  After surgery it may be brought to your room.  For patients admitted to the hospital, discharge time will be determined by your treatment team.  Patients discharged the day of surgery will not  be allowed to drive home and must have someone with them for 24 hours.    Special instructions:   DO NOT smoke tobacco or vape for 24 hours before your procedure.  Please read over the following fact sheets that you were given. Coughing and Deep Breathing, Surgical Site Infection Prevention, Anesthesia Post-op Instructions, and Care and Recovery After Surgery       Cervical Laser Surgery, Care After After cervical laser surgery, it is common to have: Pain or discomfort. Mild cramping. Bleeding, spotting, or brownish discharge from your vagina. Follow these instructions at home: Activity  Rest as told by your health care provider. Return to your normal activities as told by you provider. Ask your provider what activities are safe for you. Do not have sex until your provider says it is okay. General instructions Take over-the-counter and prescription medicines only as told by your provider. Ask your provider if the medicine prescribed to you requires you to avoid driving or using machinery. Wear menstrual pads to absorb any bleeding, spotting, and discharge. Do not put  anything into your vagina, including tampons or douche, until your provider says it is okay. It is up to you to get the results of your procedure. Ask your provider, or the department that is doing the procedure, when your results will be ready. Your provider may give you more instructions. Make sure you know what you can and cannot do. Contact a health care provider if: Your pain or cramping does not improve. Your periods are more painful than usual. You do not get your period as expected. Get help right away if: You have any symptoms of infection, such as: A fever. Chills. Discharge that smells bad. You have severe pain in your lower abdomen. You have heavy bleeding from your vagina. A sign of heavy bleeding is that you need to use more than one pad per hour. You have vaginal bleeding with clumps of blood (blood  clots). This information is not intended to replace advice given to you by your health care provider. Make sure you discuss any questions you have with your health care provider. Document Revised: 02/28/2022 Document Reviewed: 02/28/2022 Elsevier Patient Education  2024 Elsevier Inc.General Anesthesia, Adult, Care After The following information offers guidance on how to care for yourself after your procedure. Your health care provider may also give you more specific instructions. If you have problems or questions, contact your health care provider. What can I expect after the procedure? After the procedure, it is common for people to: Have pain or discomfort at the IV site. Have nausea or vomiting. Have a sore throat or hoarseness. Have trouble concentrating. Feel cold or chills. Feel weak, sleepy, or tired (fatigue). Have soreness and body aches. These can affect parts of the body that were not involved in surgery. Follow these instructions at home: For the time period you were told by your health care provider:  Rest. Do not participate in activities where you could fall or become injured. Do not drive or use machinery. Do not drink alcohol. Do not take sleeping pills or medicines that cause drowsiness. Do not make important decisions or sign legal documents. Do not take care of children on your own. General instructions Drink enough fluid to keep your urine pale yellow. If you have sleep apnea, surgery and certain medicines can increase your risk for breathing problems. Follow instructions from your health care provider about wearing your sleep device: Anytime you are sleeping, including during daytime naps. While taking prescription pain medicines, sleeping medicines, or medicines that make you drowsy. Return to your normal activities as told by your health care provider. Ask your health care provider what activities are safe for you. Take over-the-counter and prescription  medicines only as told by your health care provider. Do not use any products that contain nicotine or tobacco. These products include cigarettes, chewing tobacco, and vaping devices, such as e-cigarettes. These can delay incision healing after surgery. If you need help quitting, ask your health care provider. Contact a health care provider if: You have nausea or vomiting that does not get better with medicine. You vomit every time you eat or drink. You have pain that does not get better with medicine. You cannot urinate or have bloody urine. You develop a skin rash. You have a fever. Get help right away if: You have trouble breathing. You have chest pain. You vomit blood. These symptoms may be an emergency. Get help right away. Call 911. Do not wait to see if the symptoms will go away. Do not drive yourself to  the hospital. Summary After the procedure, it is common to have a sore throat, hoarseness, nausea, vomiting, or to feel weak, sleepy, or fatigue. For the time period you were told by your health care provider, do not drive or use machinery. Get help right away if you have difficulty breathing, have chest pain, or vomit blood. These symptoms may be an emergency. This information is not intended to replace advice given to you by your health care provider. Make sure you discuss any questions you have with your health care provider. Document Revised: 09/16/2021 Document Reviewed: 09/16/2021 Elsevier Patient Education  2024 Elsevier Inc.How to Use Chlorhexidine  at Home in the Shower Chlorhexidine  gluconate (CHG) is a germ-killing (antiseptic) wash that's used to clean the skin. It can get rid of the germs that normally live on the skin and can keep them away for about 24 hours. If you're having surgery, you may be told to shower with CHG at home the night before surgery. This can help lower your risk for infection. To use CHG wash in the shower, follow the steps below. Supplies  needed: CHG body wash. Clean washcloth. Clean towel. How to use CHG in the shower Follow these steps unless you're told to use CHG in a different way: Start the shower. Use your normal soap and shampoo to wash your face and hair. Turn off the shower or move out of the shower stream. Pour CHG onto a clean washcloth. Do not use any type of brush or rough sponge. Start at your neck, washing your body down to your toes. Make sure you: Wash the part of your body where the surgery will be done for at least 1 minute. Do not scrub. Do not use CHG on your head or face unless your health care provider tells you to. If it gets into your ears or eyes, rinse them well with water. Do not wash your genitals with CHG. Wash your back and under your arms. Make sure to wash skin folds. Let the CHG sit on your skin for 1-2 minutes or as long as told. Rinse your entire body in the shower, including all body creases and folds. Turn off the shower. Dry off with a clean towel. Do not put anything on your skin afterward, such as powder, lotion, or perfume. Put on clean clothes or pajamas. If it's the night before surgery, sleep in clean sheets. General tips Use CHG only as told, and follow the instructions on the label. Use the full amount of CHG as told. This is often one bottle. Do not smoke and stay away from flames after using CHG. Your skin may feel sticky after using CHG. This is normal. The sticky feeling will go away as the CHG dries. Do not use CHG: If you have a chlorhexidine  allergy or have reacted to chlorhexidine  in the past. On open wounds or areas of skin that have broken skin, cuts, or scrapes. On babies younger than 4 months of age. Contact a health care provider if: You have questions about using CHG. Your skin gets irritated or itchy. You have a rash after using CHG. You swallow any CHG. Call your local poison control center [phone] in the [place] Your eyes itch badly, or they  become very red or swollen. Your hearing changes. You have trouble seeing. If you can't reach your provider, go to an urgent care or emergency room. Do not drive yourself. Get help right away if: You have swelling or tingling in your mouth or throat.  You make high-pitched whistling sounds when you breathe, most often when you breathe out (wheeze). You have trouble breathing. These symptoms may be an emergency. Call 911 right away. Do not wait to see if the symptoms will go away. Do not drive yourself to the hospital. This information is not intended to replace advice given to you by your health care provider. Make sure you discuss any questions you have with your health care provider. Document Revised: 01/02/2023 Document Reviewed: 12/29/2021 Elsevier Patient Education  2024 [employer].

## 2023-12-10 ENCOUNTER — Encounter (HOSPITAL_COMMUNITY): Payer: Self-pay

## 2023-12-10 ENCOUNTER — Other Ambulatory Visit: Payer: Self-pay | Admitting: Obstetrics & Gynecology

## 2023-12-10 ENCOUNTER — Encounter (HOSPITAL_COMMUNITY)
Admission: RE | Admit: 2023-12-10 | Discharge: 2023-12-10 | Disposition: A | Discharge status: Home / Self Care | Source: Ambulatory Visit | Attending: Obstetrics & Gynecology | Admitting: Obstetrics & Gynecology

## 2023-12-10 ENCOUNTER — Other Ambulatory Visit: Payer: Self-pay

## 2023-12-10 VITALS — BP 112/73 | HR 78 | Resp 18 | Ht 62.5 in | Wt 159.0 lb

## 2023-12-10 DIAGNOSIS — I10 Essential (primary) hypertension: Secondary | ICD-10-CM

## 2023-12-10 DIAGNOSIS — Z01818 Encounter for other preprocedural examination: Secondary | ICD-10-CM

## 2023-12-10 LAB — CBC
HCT: 38.2 % (ref 36.0–46.0)
Hemoglobin: 12.8 g/dL (ref 12.0–15.0)
MCH: 30.5 pg (ref 26.0–34.0)
MCHC: 33.5 g/dL (ref 30.0–36.0)
MCV: 91.2 fL (ref 80.0–100.0)
Platelets: 241 10*3/uL (ref 150–400)
RBC: 4.19 MIL/uL (ref 3.87–5.11)
RDW: 13.1 % (ref 11.5–15.5)
WBC: 5.8 10*3/uL (ref 4.0–10.5)
nRBC: 0 % (ref 0.0–0.2)

## 2023-12-10 LAB — URINALYSIS, ROUTINE W REFLEX MICROSCOPIC
Bacteria, UA: NONE SEEN
Bilirubin Urine: NEGATIVE
Glucose, UA: NEGATIVE mg/dL
Hgb urine dipstick: NEGATIVE
Ketones, ur: NEGATIVE mg/dL
Nitrite: NEGATIVE
Protein, ur: NEGATIVE mg/dL
Specific Gravity, Urine: 1.012 (ref 1.005–1.030)
pH: 6 (ref 5.0–8.0)

## 2023-12-10 LAB — COMPREHENSIVE METABOLIC PANEL WITH GFR
ALT: 26 U/L (ref 0–44)
AST: 24 U/L (ref 15–41)
Albumin: 4 g/dL (ref 3.5–5.0)
Alkaline Phosphatase: 37 U/L — ABNORMAL LOW (ref 38–126)
Anion gap: 9 (ref 5–15)
BUN: 17 mg/dL (ref 8–23)
CO2: 26 mmol/L (ref 22–32)
Calcium: 9.6 mg/dL (ref 8.9–10.3)
Chloride: 104 mmol/L (ref 98–111)
Creatinine, Ser: 0.9 mg/dL (ref 0.44–1.00)
GFR, Estimated: >60 mL/min (ref >60)
Glucose, Bld: 80 mg/dL (ref 70–99)
Potassium: 4.3 mmol/L (ref 3.5–5.1)
Sodium: 139 mmol/L (ref 135–145)
Total Bilirubin: 0.4 mg/dL (ref 0.0–1.2)
Total Protein: 6.9 g/dL (ref 6.5–8.1)

## 2023-12-12 ENCOUNTER — Ambulatory Visit (HOSPITAL_COMMUNITY): Admitting: Anesthesiology

## 2023-12-12 ENCOUNTER — Encounter (HOSPITAL_COMMUNITY): Payer: Self-pay | Admitting: Obstetrics & Gynecology

## 2023-12-12 ENCOUNTER — Ambulatory Visit (HOSPITAL_COMMUNITY)
Admission: RE | Admit: 2023-12-12 | Discharge: 2023-12-12 | Disposition: A | Discharge status: Home / Self Care | Attending: Obstetrics & Gynecology | Admitting: Obstetrics & Gynecology

## 2023-12-12 ENCOUNTER — Encounter (HOSPITAL_COMMUNITY)
Admission: RE | Disposition: A | Discharge status: Home / Self Care | Payer: Self-pay | Source: Home / Self Care | Attending: Obstetrics & Gynecology

## 2023-12-12 DIAGNOSIS — Z7951 Long term (current) use of inhaled steroids: Secondary | ICD-10-CM | POA: Insufficient documentation

## 2023-12-12 DIAGNOSIS — J449 Chronic obstructive pulmonary disease, unspecified: Secondary | ICD-10-CM | POA: Insufficient documentation

## 2023-12-12 DIAGNOSIS — K449 Diaphragmatic hernia without obstruction or gangrene: Secondary | ICD-10-CM | POA: Diagnosis not present

## 2023-12-12 DIAGNOSIS — N763 Subacute and chronic vulvitis: Secondary | ICD-10-CM | POA: Insufficient documentation

## 2023-12-12 DIAGNOSIS — I10 Essential (primary) hypertension: Secondary | ICD-10-CM | POA: Insufficient documentation

## 2023-12-12 DIAGNOSIS — K219 Gastro-esophageal reflux disease without esophagitis: Secondary | ICD-10-CM | POA: Insufficient documentation

## 2023-12-12 DIAGNOSIS — F32A Depression, unspecified: Secondary | ICD-10-CM | POA: Insufficient documentation

## 2023-12-12 DIAGNOSIS — N762 Acute vulvitis: Secondary | ICD-10-CM

## 2023-12-12 DIAGNOSIS — M797 Fibromyalgia: Secondary | ICD-10-CM | POA: Insufficient documentation

## 2023-12-12 DIAGNOSIS — Z87891 Personal history of nicotine dependence: Secondary | ICD-10-CM | POA: Insufficient documentation

## 2023-12-12 DIAGNOSIS — Z79899 Other long term (current) drug therapy: Secondary | ICD-10-CM | POA: Diagnosis not present

## 2023-12-12 DIAGNOSIS — F419 Anxiety disorder, unspecified: Secondary | ICD-10-CM | POA: Insufficient documentation

## 2023-12-12 HISTORY — PX: LASER ABLATION CONDOLAMATA: SHX5941

## 2023-12-12 SURGERY — ABLATION, CONDYLOMA, USING LASER
Anesthesia: General | Site: Vagina

## 2023-12-12 MED ORDER — PHENYLEPHRINE 80 MCG/ML (10ML) SYRINGE FOR IV PUSH (FOR BLOOD PRESSURE SUPPORT)
PREFILLED_SYRINGE | INTRAVENOUS | Status: AC
Start: 2023-12-12 — End: 2023-12-12
  Filled 2023-12-12: qty 10

## 2023-12-12 MED ORDER — LACTATED RINGERS IV SOLN: INTRAVENOUS | Status: DC

## 2023-12-12 MED ORDER — SILVER SULFADIAZINE 1 % EX CREA
TOPICAL_CREAM | CUTANEOUS | Status: DC | PRN
  Administered 2023-12-12: 1 via TOPICAL

## 2023-12-12 MED ORDER — ORAL CARE MOUTH RINSE
15.0000 mL | Freq: Once | OROMUCOSAL | Status: AC
Start: 2023-12-12 — End: 2023-12-12

## 2023-12-12 MED ORDER — CHLORHEXIDINE GLUCONATE 0.12 % MT SOLN
15.0000 mL | Freq: Once | OROMUCOSAL | Status: AC
  Administered 2023-12-12: 15 mL via OROMUCOSAL

## 2023-12-12 MED ORDER — SCOPOLAMINE 1 MG/3DAYS TD PT72
1.0000 | MEDICATED_PATCH | TRANSDERMAL | Status: DC
  Administered 2023-12-12: 1.5 mg via TRANSDERMAL

## 2023-12-12 MED ORDER — EPHEDRINE 5 MG/ML INJ
INTRAVENOUS | Status: AC
  Filled 2023-12-12: qty 5

## 2023-12-12 MED ORDER — CEFAZOLIN SODIUM-DEXTROSE 2-4 GM/100ML-% IV SOLN
INTRAVENOUS | Status: AC
  Filled 2023-12-12: qty 100

## 2023-12-12 MED ORDER — LIDOCAINE 2% (20 MG/ML) 5 ML SYRINGE
INTRAMUSCULAR | Status: DC | PRN
  Administered 2023-12-12: 80 mg via INTRAVENOUS

## 2023-12-12 MED ORDER — POVIDONE-IODINE 10 % EX SWAB
2.0000 | Freq: Once | CUTANEOUS | Status: AC
  Administered 2023-12-12: 2 via TOPICAL

## 2023-12-12 MED ORDER — OXYCODONE HCL 5 MG PO TABS
5.0000 mg | ORAL_TABLET | Freq: Once | ORAL | Status: AC
  Administered 2023-12-12: 5 mg via ORAL

## 2023-12-12 MED ORDER — CEFAZOLIN SODIUM-DEXTROSE 2-4 GM/100ML-% IV SOLN
2.0000 g | INTRAVENOUS | Status: AC
  Administered 2023-12-12: 2 g via INTRAVENOUS

## 2023-12-12 MED ORDER — ACETAMINOPHEN 500 MG PO TABS
1000.0000 mg | ORAL_TABLET | Freq: Once | ORAL | Status: AC
  Administered 2023-12-12: 1000 mg via ORAL

## 2023-12-12 MED ORDER — ONDANSETRON HCL 4 MG/2ML IJ SOLN
INTRAMUSCULAR | Status: DC | PRN
Start: 2023-12-12 — End: 2023-12-12
  Administered 2023-12-12: 4 mg via INTRAVENOUS

## 2023-12-12 MED ORDER — OXYCODONE HCL 5 MG PO TABS
ORAL_TABLET | ORAL | Status: AC
  Filled 2023-12-12: qty 1

## 2023-12-12 MED ORDER — FENTANYL CITRATE (PF) 100 MCG/2ML IJ SOLN
INTRAMUSCULAR | Status: DC | PRN
  Administered 2023-12-12: 50 ug via INTRAVENOUS

## 2023-12-12 MED ORDER — SCOPOLAMINE 1 MG/3DAYS TD PT72
MEDICATED_PATCH | TRANSDERMAL | Status: AC
  Filled 2023-12-12: qty 1

## 2023-12-12 MED ORDER — WATER FOR IRRIGATION, STERILE IR SOLN
Status: DC | PRN
  Administered 2023-12-12: 500 mL

## 2023-12-12 MED ORDER — LIDOCAINE 2% (20 MG/ML) 5 ML SYRINGE
INTRAMUSCULAR | Status: AC
  Filled 2023-12-12: qty 5

## 2023-12-12 MED ORDER — MIDAZOLAM HCL 5 MG/5ML IJ SOLN
INTRAMUSCULAR | Status: DC | PRN
  Administered 2023-12-12: 2 mg via INTRAVENOUS

## 2023-12-12 MED ORDER — PROPOFOL 10 MG/ML IV BOLUS
INTRAVENOUS | Status: DC | PRN
Start: 2023-12-12 — End: 2023-12-12
  Administered 2023-12-12: 200 mg via INTRAVENOUS

## 2023-12-12 MED ORDER — ONDANSETRON HCL 4 MG/2ML IJ SOLN
INTRAMUSCULAR | Status: AC
  Filled 2023-12-12: qty 2

## 2023-12-12 MED ORDER — ACETAMINOPHEN 500 MG PO TABS
ORAL_TABLET | ORAL | Status: AC
  Filled 2023-12-12: qty 2

## 2023-12-12 MED ORDER — GLYCOPYRROLATE PF 0.2 MG/ML IJ SOSY
PREFILLED_SYRINGE | INTRAMUSCULAR | Status: AC
  Filled 2023-12-12: qty 1

## 2023-12-12 MED ORDER — ONDANSETRON 8 MG PO TBDP: 8.0000 mg | ORAL_TABLET | Freq: Three times a day (TID) | ORAL | 0 refills | Status: AC | PRN

## 2023-12-12 MED ORDER — MIDAZOLAM HCL 2 MG/2ML IJ SOLN
INTRAMUSCULAR | Status: AC
  Filled 2023-12-12: qty 2

## 2023-12-12 MED ORDER — OXYCODONE-ACETAMINOPHEN 5-325 MG PO TABS: 1.0000 | ORAL_TABLET | Freq: Four times a day (QID) | ORAL | 0 refills | Status: DC | PRN

## 2023-12-12 MED ORDER — MONSELS FERRIC SUBSULFATE EX SOLN
CUTANEOUS | Status: AC
  Filled 2023-12-12: qty 8

## 2023-12-12 MED ORDER — OXYCODONE HCL 5 MG PO TABS: 5.0000 mg | ORAL_TABLET | Freq: Once | ORAL | Status: DC

## 2023-12-12 MED ORDER — SILVER SULFADIAZINE 1 % EX CREA
TOPICAL_CREAM | CUTANEOUS | 11 refills | Status: AC
Start: 2023-12-12 — End: ?

## 2023-12-12 MED ORDER — PROPOFOL 10 MG/ML IV BOLUS
INTRAVENOUS | Status: AC
  Filled 2023-12-12: qty 20

## 2023-12-12 MED ORDER — FENTANYL CITRATE (PF) 100 MCG/2ML IJ SOLN
INTRAMUSCULAR | Status: AC
Start: 2023-12-12 — End: 2023-12-12
  Filled 2023-12-12: qty 2

## 2023-12-12 MED ORDER — SILVER SULFADIAZINE 1 % EX CREA
TOPICAL_CREAM | CUTANEOUS | Status: AC
  Filled 2023-12-12: qty 50

## 2023-12-12 SURGICAL SUPPLY — 19 items
COVER LIGHT HANDLE STERIS (MISCELLANEOUS) ×2 IMPLANT
GAUZE 4X4 16PLY ~~LOC~~+RFID DBL (SPONGE) ×1 IMPLANT
GLOVE BIOGEL PI IND STRL 7.0 (GLOVE) ×2 IMPLANT
GLOVE BIOGEL PI IND STRL 8 (GLOVE) ×1 IMPLANT
GLOVE ECLIPSE 8.0 STRL XLNG CF (GLOVE) ×1 IMPLANT
GOWN STRL REUS W/TWL LRG LVL3 (GOWN DISPOSABLE) ×1 IMPLANT
GOWN STRL REUS W/TWL XL LVL3 (GOWN DISPOSABLE) ×1 IMPLANT
KIT TURNOVER KIT A (KITS) ×1 IMPLANT
LASER FIBER DISP 1000U (UROLOGICAL SUPPLIES) ×1 IMPLANT
PACK SRG BSC III STRL LF ECLPS (CUSTOM PROCEDURE TRAY) ×1 IMPLANT
PAD ARMBOARD POSITIONER FOAM (MISCELLANEOUS) ×1 IMPLANT
POSITIONER HEAD 8X9X4 ADT (SOFTGOODS) ×1 IMPLANT
PREFILTER SMOKE EVAC (FILTER) ×1 IMPLANT
SCOPETTES 8 STERILE (MISCELLANEOUS) ×1 IMPLANT
SET BASIN LINEN APH (SET/KITS/TRAYS/PACK) ×1 IMPLANT
SHEET LAVH (DRAPES) ×1 IMPLANT
TOWEL OR 17X26 4PK STRL BLUE (TOWEL DISPOSABLE) ×1 IMPLANT
TUBING SMOKE EVAC CO2 (TUBING) ×1 IMPLANT
WATER STERILE IRR 1000ML POUR (IV SOLUTION) ×1 IMPLANT

## 2023-12-12 NOTE — Progress Notes (Signed)
 With permission from the patient, called patients daughter, Camilo Cella, to let her know her mom is still waiting to go back for her procedure, she is comfortable and calm.  Informed her the patient(her mom) will hopefully be going back for her procedure in the next 30-35 minutes.  Informed her another OR case ran over and that is why her procedure was delayed.  Told the daughter once the procedure is done she will stay in the recovery area for about 45 minutes-1 hour and then be ready for discharge.  Told the daughter someone will call her when the patient is ready for discharge. Camilo Cella, patients daughter, voiced understanding.

## 2023-12-12 NOTE — Transfer of Care (Addendum)
 Immediate Anesthesia Transfer of Care Note  Patient: Susan Baker  Procedure(s) Performed: ABLATION, CONDYLOMA, USING LASER (Vagina )  Patient Location: PACU  Anesthesia Type:General  Level of Consciousness: awake  Airway & Oxygen Therapy: Patient Spontanous Breathing  Post-op Assessment: Report given to RN  Post vital signs: Reviewed and stable  Last Vitals:  Vitals Value Taken Time  BP 98.1   Temp 125/70   Pulse 66   Resp 16   SpO2 98     Last Pain:  Vitals:   12/12/23 1130  PainSc: 0-No pain         Complications: No notable events documented.

## 2023-12-12 NOTE — Anesthesia Postprocedure Evaluation (Signed)
 Anesthesia Post Note  Patient: Susan Baker  Procedure(s) Performed: ABLATION, CONDYLOMA, USING LASER (Vagina )  Patient location during evaluation: PACU Anesthesia Type: General Level of consciousness: awake and alert Pain management: pain level controlled Vital Signs Assessment: post-procedure vital signs reviewed and stable Respiratory status: spontaneous breathing, nonlabored ventilation, respiratory function stable and patient connected to nasal cannula oxygen Cardiovascular status: blood pressure returned to baseline and stable Postop Assessment: no apparent nausea or vomiting Anesthetic complications: no   There were no known notable events for this encounter.   Last Vitals:  Vitals:   12/12/23 1515 12/12/23 1523  BP: 110/83 111/81  Pulse: 63 69  Resp: 11 (!) 21  Temp:    SpO2: 98% 100%    Last Pain:  Vitals:   12/12/23 1530  PainSc: 5                  Airyanna Dipalma L Lolah Coghlan

## 2023-12-12 NOTE — H&P (Signed)
 Preoperative History and Physical  Susan Baker is a 63 y.o. [phone] with No LMP recorded. Patient has had a hysterectomy. admitted for a laser ablation of the vulva for painful lesions of the vulva biopsy reveals chronic vulvitis, benign, no atypia.    PMH:    Past Medical History:  Diagnosis Date   Abnormal Pap smear    Abnormal ThinPrep Pap test of vagina 04/25/2017   LSIL will get colp__________   Anxiety    Bulging disc    cervical, [facility]   COPD (chronic obstructive pulmonary disease) (HCC)    Depression 02/18/2013   Fibromyalgia    GERD (gastroesophageal reflux disease)    egd with RE per patient, remote   Hematuria 05/14/2014   History of abnormal cervical Pap smear 02/24/2014   Hypertension    Lupus (systemic lupus erythematosus) (HCC)    Osteoarthritis (arthritis due to wear and tear of joints)    Osteoporosis    Osteoporosis, unspecified 03/03/2014   Postcoital bleeding 09/15/2015   Postmenopausal 02/24/2014   PUD (peptic ulcer disease)    bleeding PUD per patient, remote, On Daypro   Urinary frequency 05/14/2014   Uterine cancer (HCC)    Vaginal Pap smear, abnormal     PSH:     Past Surgical History:  Procedure Laterality Date   BIOPSY  01/18/2022   Procedure: BIOPSY;  Surgeon: Suzette Espy, MD;  Location: [facility];  Service: Endoscopy;;   BRONCHIAL BIOPSY  01/26/2022   Procedure: BRONCHIAL BIOPSIES;  Surgeon: Denson Flake, MD;  Location: [facility] ENDOSCOPY;  Service: Cardiopulmonary;;   BRONCHIAL BRUSHINGS  01/26/2022   Procedure: BRONCHIAL BRUSHINGS;  Surgeon: Denson Flake, MD;  Location: [facility] ENDOSCOPY;  Service: Cardiopulmonary;;   BRONCHIAL WASHINGS  01/26/2022   Procedure: BRONCHIAL WASHINGS;  Surgeon: Denson Flake, MD;  Location: [facility] ENDOSCOPY;  Service: Cardiopulmonary;;   CHOLECYSTECTOMY N/A 11/07/2019   Procedure: LAPAROSCOPIC CHOLECYSTECTOMY;  Surgeon: Awilda Bogus, MD;  Location: [facility] ORS;  Service: General;  Laterality: N/A;    COLONOSCOPY WITH PROPOFOL  N/A 11/22/2015   Dr.Rourk- the entire examined colon is normal, the examined portion of the ileum was normal, non-bleeding internal hemorrhoids. no specimens collected.    ECTOPIC PREGNANCY SURGERY     twice   ESOPHAGEAL DILATION N/A 11/22/2015   Procedure: ESOPHAGEAL DILATION;  Surgeon: Suzette Espy, MD;  Location: [facility];  Service: Endoscopy;  Laterality: N/A;   ESOPHAGOGASTRODUODENOSCOPY  12/01/2010   Dr. Riley Cheadle: mild distal ERE, antral erosions due to NSAIDS, no H.Pylori   ESOPHAGOGASTRODUODENOSCOPY (EGD) WITH PROPOFOL  N/A 11/22/2015   Dr.Rourk- grade A esophagitis, dilated. small hiatal hernia, normal stomach, normal second portion of the duodenum.   ESOPHAGOGASTRODUODENOSCOPY (EGD) WITH PROPOFOL  N/A 10/27/2019   normal esophagus, normal stomach, normal duodenum.    ESOPHAGOGASTRODUODENOSCOPY (EGD) WITH PROPOFOL  N/A 01/18/2022   Procedure: ESOPHAGOGASTRODUODENOSCOPY (EGD) WITH PROPOFOL ;  Surgeon: Suzette Espy, MD;  Location: [facility];  Service: Endoscopy;  Laterality: N/A;  12:45pm   hysterectomy for uterine cancer     partial   VIDEO BRONCHOSCOPY N/A 01/26/2022   Procedure: VIDEO BRONCHOSCOPY WITHOUT FLUORO;  Surgeon: Denson Flake, MD;  Location: [facility] ENDOSCOPY;  Service: Cardiopulmonary;  Laterality: N/A;    POb/GynH:      OB History     Gravida  4   Para  2   Term  2   Preterm      AB  2   Living  2  SAB      IAB      Ectopic  2   Multiple      Live Births  2           SH:   Social History   Tobacco Use   Smoking status: Former    Current packs/day: 0.00    Average packs/day: 0.5 packs/day for 35.0 years (17.5 ttl pk-yrs)    Types: E-cigarettes, Cigarettes    Start date: 07/22/1984    Quit date: 07/23/2019    Years since quitting: 4.3   Smokeless tobacco: Never   Tobacco comments:    using vape at times  Vaping Use   Vaping status: Every Day   Substances: Nicotine-salt  Substance Use Topics   Alcohol  use: Not Currently    Comment: occasional social use    Drug use: No    FH:    Family History  Problem Relation Age of Onset   Cirrhosis Father        etoh, died with liver cancer   Diabetes Father    Cirrhosis Paternal Uncle        multiple, etoh   Pancreatic cancer Paternal Grandmother    Diabetes Paternal Grandmother    Kidney failure Mother    Diabetes Mother    Other Brother        crohn's disease   Hyperlipidemia Brother    Fibromyalgia Daughter    Diabetes Maternal Grandmother    Healthy Son    Colon cancer Maternal Uncle    Colon polyps Neg Hx      Allergies:  Allergies  Allergen Reactions   Cymbalta [Duloxetine Hcl] Other (See Comments)    Depression, anxiety, wt gain   Daypro [Oxaprozin] Other (See Comments)    Ulcers   Sulfa  Antibiotics Other (See Comments)    Bactrim -Could not function   Trazodone And Nefazodone Other (See Comments)    Could not function   Nucynta [Tapentadol] Other (See Comments)    Memory loss and slurred speech    Medications:       Current Facility-Administered Medications:    ceFAZolin (ANCEF) 2-4 GM/100ML-% IVPB, , , ,    ceFAZolin (ANCEF) IVPB 2g/100 mL premix, 2 g, Intravenous, On Call to OR, Jodye Scali, Sixto Duhamel, MD   lactated ringers  infusion, , Intravenous, Continuous, Araceli Knight, MD, Last Rate: 10 mL/hr at 12/12/23 1150, New Bag at 12/12/23 1150  Review of Systems:   Review of Systems  Constitutional: Negative for fever, chills, weight loss, malaise/fatigue and diaphoresis.  HENT: Negative for hearing loss, ear pain, nosebleeds, congestion, sore throat, neck pain, tinnitus and ear discharge.   Eyes: Negative for blurred vision, double vision, photophobia, pain, discharge and redness.  Respiratory: Negative for cough, hemoptysis, sputum production, shortness of breath, wheezing and stridor.   Cardiovascular: Negative for chest pain, palpitations, orthopnea, claudication, leg swelling and PND.  Gastrointestinal: Positive  for abdominal pain. Negative for heartburn, nausea, vomiting, diarrhea, constipation, blood in stool and melena.  Genitourinary: Negative for dysuria, urgency, frequency, hematuria and flank pain.  Musculoskeletal: Negative for myalgias, back pain, joint pain and falls.  Skin: Negative for itching and rash.  Neurological: Negative for dizziness, tingling, tremors, sensory change, speech change, focal weakness, seizures, loss of consciousness, weakness and headaches.  Endo/Heme/Allergies: Negative for environmental allergies and polydipsia. Does not bruise/bleed easily.  Psychiatric/Behavioral: Negative for depression, suicidal ideas, hallucinations, memory loss and substance abuse. The patient is not nervous/anxious and does not have insomnia.  PHYSICAL EXAM:  Blood pressure 115/76, temperature 97.7 F (36.5 C), resp. rate 16, height 5' 2.5 (1.588 m), weight 72.1 kg, SpO2 97%.    Vitals reviewed. Constitutional: She is oriented to person, place, and time. She appears well-developed and well-nourished.  HENT:  Head: Normocephalic and atraumatic.  Right Ear: External ear normal.  Left Ear: External ear normal.  Nose: Nose normal.  Mouth/Throat: Oropharynx is clear and moist.  Eyes: Conjunctivae and EOM are normal. Pupils are equal, round, and reactive to light. Right eye exhibits no discharge. Left eye exhibits no discharge. No scleral icterus.  Neck: Normal range of motion. Neck supple. No tracheal deviation present. No thyromegaly present.  Cardiovascular: Normal rate, regular rhythm, normal heart sounds and intact distal pulses.  Exam reveals no gallop and no friction rub.   No murmur heard. Respiratory: Effort normal and breath sounds normal. No respiratory distress. She has no wheezes. She has no rales. She exhibits no tenderness.  GI: Soft. Bowel sounds are normal. She exhibits no distension and no mass. There is tenderness. There is no rebound and no guarding.  Genitourinary:        Vulva 2 areas 1 on each vulva erythematous tender on exam, kissing lesions really Vagina is pink moist without discharge Cervix absent Uterus is absent Adnexa is negative with normal sized ovaries by sonogram  Musculoskeletal: Normal range of motion. She exhibits no edema and no tenderness.  Neurological: She is alert and oriented to person, place, and time. She has normal reflexes. She displays normal reflexes. No cranial nerve deficit. She exhibits normal muscle tone. Coordination normal.  Skin: Skin is warm and dry. No rash noted. No erythema. No pallor.  Psychiatric: She has a normal mood and affect. Her behavior is normal. Judgment and thought content normal.    Labs: Results for orders placed or performed during the hospital encounter of 12/10/23 (from the past 2 weeks)  CBC   Collection Time: 12/10/23  8:39 AM  Result Value Ref Range   WBC 5.8 4.0 - 10.5 K/uL   RBC 4.19 3.87 - 5.11 MIL/uL   Hemoglobin 12.8 12.0 - 15.0 g/dL   HCT 47.8 29.5 - 62.1 %   MCV 91.2 80.0 - 100.0 fL   MCH 30.5 26.0 - 34.0 pg   MCHC 33.5 30.0 - 36.0 g/dL   RDW 30.8 65.7 - 84.6 %   Platelets 241 150 - 400 K/uL   nRBC 0.0 0.0 - 0.2 %  Comprehensive metabolic panel   Collection Time: 12/10/23  8:39 AM  Result Value Ref Range   Sodium 139 135 - 145 mmol/L   Potassium 4.3 3.5 - 5.1 mmol/L   Chloride 104 98 - 111 mmol/L   CO2 26 22 - 32 mmol/L   Glucose, Bld 80 70 - 99 mg/dL   BUN 17 8 - 23 mg/dL   Creatinine, Ser 9.62 0.44 - 1.00 mg/dL   Calcium 9.6 8.9 - 95.2 mg/dL   Total Protein 6.9 6.5 - 8.1 g/dL   Albumin 4.0 3.5 - 5.0 g/dL   AST 24 15 - 41 U/L   ALT 26 0 - 44 U/L   Alkaline Phosphatase 37 (L) 38 - 126 U/L   Total Bilirubin 0.4 0.0 - 1.2 mg/dL   GFR, Estimated >84 >13 mL/min   Anion gap 9 5 - 15  Urinalysis, Routine w reflex microscopic -Urine, Clean Catch   Collection Time: 12/10/23  8:39 AM  Result Value Ref Range   Color, Urine  YELLOW YELLOW   APPearance HAZY (A) CLEAR    Specific Gravity, Urine 1.012 1.005 - 1.030   pH 6.0 5.0 - 8.0   Glucose, UA NEGATIVE NEGATIVE mg/dL   Hgb urine dipstick NEGATIVE NEGATIVE   Bilirubin Urine NEGATIVE NEGATIVE   Ketones, ur NEGATIVE NEGATIVE mg/dL   Protein, ur NEGATIVE NEGATIVE mg/dL   Nitrite NEGATIVE NEGATIVE   Leukocytes,Ua MODERATE (A) NEGATIVE   RBC / HPF 0-5 0 - 5 RBC/hpf   WBC, UA 6-10 0 - 5 WBC/hpf   Bacteria, UA NONE SEEN NONE SEEN   Squamous Epithelial / HPF 0-5 0 - 5 /HPF   Mucus PRESENT     EKG: Orders placed or performed during the hospital encounter of 12/10/23   EKG 12-LEAD   EKG 12-LEAD    Imaging Studies: No results found.    Assessment: Chronic vulvitis with pain  Plan: Laser ablation of the vulva for therapeutic and symptom relief  Wendelyn Halter 12/12/2023 2:12 PM

## 2023-12-12 NOTE — Anesthesia Preprocedure Evaluation (Signed)
 Anesthesia Evaluation  Patient identified by MRN, date of birth, ID band Patient awake    Reviewed: Allergy & Precautions, NPO status , Patient's Chart, lab work & pertinent test results  Airway Mallampati: II  TM Distance: >3 FB Neck ROM: Full    Dental  (+) Dental Advisory Given, Missing   Pulmonary shortness of breath and with exertion, COPD,  COPD inhaler, former smoker Hemoptysis, following pulmonary   Pulmonary exam normal breath sounds clear to auscultation       Cardiovascular hypertension, Pt. 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 Solid 3 mm right middle lobe pulmonary nodule, is favored infectious or inflammatory but technically nonspecific, recommend attention on close interval follow-up chest imaging. 2. Band of subpleural reticulations with adjacent ground-glass in the lateral segment of the right middle lobe without discrete nodularity. Findings are favored to represent an infectious or inflammatory process. 3. Hepatic steatosis. 4. Layering stones within a 19 mm hypodense area in the left upper pole kidney, present dating back to September 19, 2007 most consistent with a calyceal diverticulum. 5.  Aortic Atherosclerosis (ICD10-I70.0).   Electronically Signed   By: Tama Fails M.D.   On: 11/29/2021 17:05  Reproductive/Obstetrics negative OB ROS                              Anesthesia Physical Anesthesia Plan  ASA: 3  Anesthesia Plan: General   Post-op Pain Management: Minimal or no pain anticipated   Induction: Intravenous  PONV Risk Score and Plan: Ondansetron , Dexamethasone , Midazolam  and Scopolamine  patch - Pre-op  Airway Management Planned: LMA  Additional Equipment: None  Intra-op Plan:   Post-operative Plan: Extubation in OR  Informed Consent: I have reviewed the patients History and Physical, chart, labs and discussed the procedure including the risks, benefits and alternatives for the proposed anesthesia with the patient or authorized representative who has indicated his/her understanding and acceptance.     Dental advisory given  Plan Discussed with: CRNA  Anesthesia Plan Comments:         Anesthesia Quick Evaluation

## 2023-12-12 NOTE — Anesthesia Procedure Notes (Signed)
 Procedure Name: LMA Insertion Date/Time: 12/12/2023 2:28 PM  Performed by: Leeanne Puffer, CRNAPre-anesthesia Checklist: Patient identified, Patient being monitored, Emergency Drugs available, Timeout performed and Suction available Patient Re-evaluated:Patient Re-evaluated prior to induction Oxygen Delivery Method: Circle System Utilized Preoxygenation: Pre-oxygenation with 100% oxygen Induction Type: IV induction Ventilation: Mask ventilation without difficulty LMA: LMA inserted LMA Size: 4.0 Number of attempts: 1 Placement Confirmation: positive ETCO2 and breath sounds checked- equal and bilateral

## 2023-12-12 NOTE — Op Note (Signed)
 Preoperative diagnosis: Chronic vulvitis, painful, worsening, bilateral  Postoperative diagnosis: Same as above  Procedure: Laser ablation of vulva bilaterally  Surgeon: Wendelyn Halter, MD  Anesthesia: Laryngeal mask airway  Findings: Patient was referred to me for erythematous tender areas mainly on the left vulva but was beginning to occur as well on the right vulva Initially had 1 lesion on the left that became 2 and they were quite tender I did a biopsy in the office which showed acute and chronic vulvitis, no dysplasia atypia or malignancy was noted  Description of operation:  Patient was taken to the operating room and placed in the supine position where she underwent laryngeal mask airway anesthesia She was placed in the candycane Sterets in lithotomy position She was prepped and draped in usual sterile fashion The holmium laser was used Settings were rate of 10 pulses per second and a power of 2.5 W The laser was employed at a distance in order to vaporize and ablate the affected tissue I got a good 2 mm of normal tissue margin around the lesions on both sides I did a 3 layer depth of treatment just getting to the subcutaneous layer and stopping there is is appropriate for this condition There was no bleeding Silvadene cream was placed on all 3 surgical sites 2 on the left and 1 on the right The patient was awakened from anesthesia and taken recovery in good stable condition all counts were correct x 3 She received 2 g of Ancef preoperatively  Wendelyn Halter, MD 12/12/2023 3:03 PM

## 2023-12-13 ENCOUNTER — Encounter (HOSPITAL_COMMUNITY): Payer: Self-pay | Admitting: Obstetrics & Gynecology

## 2023-12-27 ENCOUNTER — Encounter: Payer: Self-pay | Admitting: Obstetrics & Gynecology

## 2023-12-27 ENCOUNTER — Ambulatory Visit: Admitting: Obstetrics & Gynecology

## 2023-12-27 VITALS — BP 110/70 | HR 86 | Ht 62.5 in | Wt 157.0 lb

## 2023-12-27 DIAGNOSIS — Z9889 Other specified postprocedural states: Secondary | ICD-10-CM

## 2023-12-27 NOTE — Progress Notes (Signed)
  HPI: Patient returns for routine postoperative follow-up having undergone    ICD-10-CM   1. Post-operative state: laser of vulva 12/12/23  Z98.890       .  The patient's immediate postoperative recovery has been unremarkable. Since hospital discharge the patient reports no problems.   Current Outpatient Medications: Calcium Carb-Cholecalciferol 600-10 MG-MCG TABS, Take 1 tablet by mouth in the morning., Disp: , Rfl:  Collagen-Vitamin C-Biotin (COLLAGEN PO), Take 1 capsule by mouth in the morning. Biotin-Collagen-Keratin, Disp: , Rfl:  FLUoxetine (PROZAC) 40 MG capsule, Take 40 mg by mouth in the morning., Disp: , Rfl:  folic acid (FOLVITE) 400 MCG tablet, Take 400 mcg by mouth in the morning., Disp: , Rfl:  hydroxychloroquine (PLAQUENIL) 200 MG tablet, Take 200 mg by mouth 2 (two) times daily., Disp: , Rfl:  ibuprofen (ADVIL) 200 MG tablet, Take 600-800 mg by mouth every 8 (eight) hours as needed (pain.)., Disp: , Rfl:  lisinopril (ZESTRIL) 10 MG tablet, Take 10 mg by mouth in the morning., Disp: , Rfl:  MAGNESIUM PO, Take 250 mg by mouth in the morning., Disp: , Rfl:  ondansetron  (ZOFRAN -ODT) 8 MG disintegrating tablet, Take 1 tablet (8 mg total) by mouth every 8 (eight) hours as needed for nausea or vomiting., Disp: 8 tablet, Rfl: 0 oxyCODONE -acetaminophen  (PERCOCET) 5-325 MG tablet, Take 1 tablet by mouth every 6 (six) hours as needed for severe pain (pain score 7-10)., Disp: 21 tablet, Rfl: 0 pantoprazole  (PROTONIX ) 40 MG tablet, Take 1 tablet by mouth once daily, Disp: 270 tablet, Rfl: 0 predniSONE (DELTASONE) 5 MG tablet, Take 5 mg by mouth daily with breakfast., Disp: , Rfl:  pregabalin (LYRICA) 150 MG capsule, Take 150 mg by mouth 2 (two) times daily., Disp: , Rfl:  silver  sulfADIAZINE  (SILVADENE ) 1 % cream, Apply to areas 3 times per day, Disp: 50 g, Rfl: 11 conjugated  estrogens  (PREMARIN ) vaginal cream, Use pea sized amount bid for 2 weeks (Patient not taking: Reported on  12/27/2023), Disp: 16 g, Rfl: 0  No current facility-administered medications for this visit.    Blood pressure 110/70, pulse 86, height 5' 2.5 (1.588 m), weight 157 lb (71.2 kg).  Physical Exam: Bilateral lesions healing well with danger of healing fused, super important patient get the silvadene  between the vulva to prevent fusion  Diagnostic Tests:   Pathology: Acute on chronic vulvitis  Impression + Management plan:   ICD-10-CM   1. Post-operative state: laser of vulva 12/12/23  Z98.890           Medications Prescribed this encounter: No orders of the defined types were placed in this encounter.     Follow up: Return in about 4 weeks (around 01/24/2024) for Post Op, Follow up, with Dr Jayne.    Vonn VEAR Jayne, MD Attending Physician for the Center for [facility] and [facility] 12/27/2023 3:25 PM

## 2024-01-22 ENCOUNTER — Encounter: Payer: Self-pay | Admitting: Obstetrics & Gynecology

## 2024-01-22 ENCOUNTER — Ambulatory Visit: Admitting: Obstetrics & Gynecology

## 2024-01-22 VITALS — BP 109/73 | HR 106 | Ht 62.5 in | Wt 156.0 lb

## 2024-01-22 DIAGNOSIS — Z48816 Encounter for surgical aftercare following surgery on the genitourinary system: Secondary | ICD-10-CM

## 2024-01-22 DIAGNOSIS — Z9889 Other specified postprocedural states: Secondary | ICD-10-CM

## 2024-01-22 NOTE — Progress Notes (Signed)
  HPI: Patient returns for routine postoperative follow-up having undergone  No diagnosis found.   .  The patient's immediate postoperative recovery has been unremarkable. Since hospital discharge the patient reports no problems.   Current Outpatient Medications: Calcium Carb-Cholecalciferol 600-10 MG-MCG TABS, Take 1 tablet by mouth in the morning., Disp: , Rfl:  Collagen-Vitamin C-Biotin (COLLAGEN PO), Take 1 capsule by mouth in the morning. Biotin-Collagen-Keratin, Disp: , Rfl:  FLUoxetine (PROZAC) 40 MG capsule, Take 40 mg by mouth in the morning., Disp: , Rfl:  folic acid (FOLVITE) 400 MCG tablet, Take 400 mcg by mouth in the morning., Disp: , Rfl:  hydroxychloroquine (PLAQUENIL) 200 MG tablet, Take 200 mg by mouth 2 (two) times daily., Disp: , Rfl:  ibuprofen (ADVIL) 200 MG tablet, Take 600-800 mg by mouth every 8 (eight) hours as needed (pain.)., Disp: , Rfl:  lisinopril (ZESTRIL) 10 MG tablet, Take 10 mg by mouth in the morning., Disp: , Rfl:  MAGNESIUM PO, Take 250 mg by mouth in the morning., Disp: , Rfl:  ondansetron  (ZOFRAN -ODT) 8 MG disintegrating tablet, Take 1 tablet (8 mg total) by mouth every 8 (eight) hours as needed for nausea or vomiting., Disp: 8 tablet, Rfl: 0 oxyCODONE -acetaminophen  (PERCOCET) 5-325 MG tablet, Take 1 tablet by mouth every 6 (six) hours as needed for severe pain (pain score 7-10)., Disp: 21 tablet, Rfl: 0 pantoprazole  (PROTONIX ) 40 MG tablet, Take 1 tablet by mouth once daily, Disp: 270 tablet, Rfl: 0 predniSONE (DELTASONE) 5 MG tablet, Take 5 mg by mouth daily with breakfast., Disp: , Rfl:  pregabalin (LYRICA) 150 MG capsule, Take 150 mg by mouth 2 (two) times daily., Disp: , Rfl:  silver  sulfADIAZINE  (SILVADENE ) 1 % cream, Apply to areas 3 times per day, Disp: 50 g, Rfl: 11 conjugated  estrogens  (PREMARIN ) vaginal cream, Use pea sized amount bid for 2 weeks (Patient not taking: Reported on 12/27/2023), Disp: 16 g, Rfl: 0  No current  facility-administered medications for this visit.    Blood pressure 110/70, pulse 86, height 5' 2.5 (1.588 m), weight 157 lb (71.2 kg).  Physical Exam: Bilateral lesions healing well with danger of healing fused, super important patient get the silvadene  between the vulva to prevent fusion  Diagnostic Tests:   Pathology: Acute on chronic vulvitis  Impression + Management plan: No diagnosis found.       Medications Prescribed this encounter: No orders of the defined types were placed in this encounter.     Follow up: Return in about 3 months (around 04/23/2024) for Follow up, with Dr Jayne.    Vonn VEAR Jayne, MD Attending Physician for the Center for [facility] and [facility] 12/27/2023 3:25 PM

## 2024-04-09 NOTE — Progress Notes (Signed)
 Cardiology Office Note:  .   Date:  04/10/2024  ID:  Susan Baker, DOB Aug 09, 1960, MRN [id number] PCP: Luke Agent, MD (Inactive)  [facility] Providers Cardiologist:  Shelda Bruckner, MD {  History of Present Illness: .   Susan Baker is a 63 y.o. female with PMH lupus, mixed connective tissue disease, former tobacco use, fibromyalgia, hypertension is seen as a new patient consultation at the request of Rogue Kiang, NP for the evaluation of palpitations.  Referral from 02/28/24 reviewed. Referred for evaluation of palpitations. Note from 11/06/23 included but does not reference any cardiac symptoms. There are also notes from 08/06/23 which comment on fatigue, dizziness, chest pain, shortness of breath, and tremors. Lisinopril was held to see if this was the etiology of her symptoms.  She notes that she feels either central or upper chest (both right and left), sharp and dull pain on and off for the last several months. This last week it has been occurring every day. She notes that she is sore when she lays on her left side, better if she changes position. Each episode lasts 10-15 minutes, but isn't gone for long before it returns. Other than stress, no clear triggers. Nothing seems to make it better (except for when it is positional as above). Not related to exertion.  Also notes feelings that her heart is beating out of her chest, sometimes same as chest pain, other times independently.   Notes sometimes she has tremors and shortness of breath independent of other symptoms, sometimes at rest, sometimes with minimal activity. Notes chronic shortness of breath climbing stairs.  Notes that her symptoms are worse with stress, and she is under a lot currently.   Also noted bruise on right forearm that has not healed since June.  Also notes diffuse pains in shoulders, neck, etc. She has difficulty in determining what is due to lupus/inflammation and what might be due to her  heart.  Quit smoking 5 years ago, but still vapes.  Only thing she's ever been told about her heart is that she had a valve issue years ago.  ROS: No PND, orthopnea, LE edema. No syncope. ROS otherwise negative except as noted.   Studies Reviewed: SABRA    EKG:  EKG Interpretation Date/Time:  Thursday April 10 2024 13:47:47 EDT Ventricular Rate:  99 PR Interval:  136 QRS Duration:  74 QT Interval:  362 QTC Calculation: 464 R Axis:   34  Text Interpretation: Normal sinus rhythm Nonspecific ST and T wave abnormality Confirmed by Bruckner Shelda [phone]) on 04/10/2024 2:11:19 PM    Physical Exam:   VS:  BP 112/70 (BP Location: Right Arm, Cuff Size: Normal)   Pulse 99   Ht 5' 2.5 (1.588 m)   Wt 162 lb 3.2 oz (73.6 kg)   SpO2 100%   BMI 29.19 kg/m    Wt Readings from Last 3 Encounters:  04/10/24 162 lb 3.2 oz (73.6 kg)  01/22/24 156 lb (70.8 kg)  12/27/23 157 lb (71.2 kg)    GEN: Well nourished, well developed in no acute distress HEENT: Normal, moist mucous membranes NECK: No JVD CARDIAC: regular rhythm, normal S1 and S2, no rubs or gallops. No murmur. VASCULAR: Radial and DP pulses 2+ bilaterally. No carotid bruits RESPIRATORY:  Clear to auscultation without rales, wheezing or rhonchi  ABDOMEN: Soft, non-tender, non-distended MUSCULOSKELETAL:  Ambulates independently SKIN: Warm and dry, no edema NEUROLOGIC:  Alert and oriented x 3. No focal neuro deficits noted. PSYCHIATRIC:  Normal affect    ASSESSMENT AND PLAN: .    Chest pain, unclear etiology Palpitations Shortness of breath -check echo for shortness of breath, lupus as risk for effusion -was noted to have coronary calcification on chest CT 08/2022 as well as aortic atherosclerosis -risk factors for CAD: former smoker, long term inflammation with lupus -discussed CT coronary for further evaluation, she is amenable. -discussed 1 week Zio given frequent symptoms, given instructions to avoid having on for echo  or CT if possible  Hypertension -well controlled, continue lisinopril  Comorbidities: lupus/mixed connective tissue disease, fibromyalgia can cause similar symptoms  CV risk counseling and prevention -recommend heart healthy/Mediterranean diet, with whole grains, fruits, vegetable, fish, lean meats, nuts, and olive oil. Limit salt. -recommend moderate walking, 3-5 times/week for 30-50 minutes each session. Aim for at least 150 minutes/week. Goal should be pace of 3 miles/hours, or walking 1.5 miles in 30 minutes -recommend avoidance of tobacco products. Avoid excess alcohol. -ASCVD risk score: The 10-year ASCVD risk score (Arnett DK, et al., 2019) is: 2.9%   Values used to calculate the score:     Age: 6 years     Clincally relevant sex: Female     Is Non-Hispanic African American: No     Diabetic: No     Tobacco smoker: No     Systolic Blood Pressure: 112 mmHg     Is BP treated: Yes     HDL Cholesterol: 79 mg/dL     Total Cholesterol: 159 mg/dL    Dispo: 6 weeks to review results of testing  Signed, Shelda Bruckner, MD   Shelda Bruckner, MD, PhD, [facility]  [facility]  [place]  [facility] & Vascular at [facility] at [facility] [address] [place], [place] [address] [phone]

## 2024-04-10 ENCOUNTER — Ambulatory Visit (INDEPENDENT_AMBULATORY_CARE_PROVIDER_SITE_OTHER): Admitting: Cardiology

## 2024-04-10 ENCOUNTER — Encounter (HOSPITAL_BASED_OUTPATIENT_CLINIC_OR_DEPARTMENT_OTHER): Payer: Self-pay | Admitting: Cardiology

## 2024-04-10 ENCOUNTER — Ambulatory Visit: Attending: Cardiology

## 2024-04-10 VITALS — BP 112/70 | HR 99 | Ht 62.5 in | Wt 162.2 lb

## 2024-04-10 DIAGNOSIS — R002 Palpitations: Secondary | ICD-10-CM

## 2024-04-10 DIAGNOSIS — R0602 Shortness of breath: Secondary | ICD-10-CM | POA: Diagnosis not present

## 2024-04-10 DIAGNOSIS — I10 Essential (primary) hypertension: Secondary | ICD-10-CM | POA: Diagnosis not present

## 2024-04-10 DIAGNOSIS — R079 Chest pain, unspecified: Secondary | ICD-10-CM

## 2024-04-10 DIAGNOSIS — R072 Precordial pain: Secondary | ICD-10-CM

## 2024-04-10 MED ORDER — METOPROLOL TARTRATE 100 MG PO TABS: 100.0000 mg | ORAL_TABLET | Freq: Once | ORAL | 0 refills | Status: DC

## 2024-04-10 NOTE — Progress Notes (Unsigned)
 Enrolled patient for a 7 day Zio XT monitor to be mailed to patients home.

## 2024-04-10 NOTE — Patient Instructions (Signed)
 We will get echocardiogram (ultrasound), CT scan, and have you wear a one week monitor. Try to avoid having the monitor on for the echocardiogram or CT scan--if there will be a solid week you can wear the monitor before these test are done, it's ok to wear it before the other tests. Otherwise, if there won't be a full week that you can wear the monitor before the echo or the CT scan, wait to put in on until after the tests are done.   Medication Instructions:  No changes *If you need a refill on your cardiac medications before your next appointment, please call your pharmacy*  Lab Work: none  Testing/Procedures: Your physician has requested that you have an echocardiogram. Echocardiography is a painless test that uses sound waves to create images of your heart. It provides your doctor with information about the size and shape of your heart and how well your heart's chambers and valves are working. This procedure takes approximately one hour. There are no restrictions for this procedure. Please do NOT wear cologne, perfume, aftershave, or lotions (deodorant is allowed). Please arrive 15 minutes prior to your appointment time.  Please note: We ask at that you not bring children with you during ultrasound (echo/ vascular) testing. Due to room size and safety concerns, children are not allowed in the ultrasound rooms during exams. Our front office staff cannot provide observation of children in our lobby area while testing is being conducted. An adult accompanying a patient to their appointment will only be allowed in the ultrasound room at the discretion of the ultrasound technician under special circumstances. We apologize for any inconvenience.  ZIO XT - heart monitor see instructions below  Coronary CT Angiogram- see instructions below  Follow-Up: At [facility], you and your health needs are our priority.  As part of our continuing mission to provide you with exceptional heart care,  our providers are all part of one team.  This team includes your primary Cardiologist (physician) and Advanced Practice Providers or APPs (Physician Assistants and Nurse Practitioners) who all work together to provide you with the care you need, when you need it.  Your next appointment:   6 week(s)  Provider:   Shelda Bruckner, MD     GEOFFRY HEWS- Long Term Monitor Instructions  Your physician has requested you wear a ZIO patch monitor for 7 days.  This is a single patch monitor. [employer] supplies one patch monitor per enrollment. Additional stickers are not available. Please do not apply patch if you will be having a Nuclear Stress Test,  Echocardiogram, Cardiac CT, MRI, or Chest Xray during the period you would be wearing the  monitor. The patch cannot be worn during these tests. You cannot remove and re-apply the  ZIO XT patch monitor.  Your ZIO patch monitor will be mailed 3 day USPS to your address on file. It may take 3-5 days  to receive your monitor after you have been enrolled.  Once you have received your monitor, please review the enclosed instructions. Your monitor  has already been registered assigning a specific monitor serial # to you.  Billing and Patient Assistance Program Information  We have supplied [employer] with any of your insurance information on file for billing purposes. [employer] offers a sliding scale Patient Assistance Program for patients that do not have  insurance, or whose insurance does not completely cover the cost of the ZIO monitor.  You must apply for the Patient Assistance Program to qualify for  this discounted rate.  To apply, please call [employer] at [phone], select option 4, select option 2, ask to apply for  Patient Assistance Program. Meredeth will ask your household income, and how many people  are in your household. They will quote your out-of-pocket cost based on that information.  [employer] will also be able to set up a 37-month,  interest-free payment plan if needed.  Applying the monitor Hold abrader disc by orange tab. Rub abrader in 40 strokes over the upper left chest as  indicated in your monitor instructions.  Clean area with 4 enclosed alcohol pads. Let dry.  Apply patch as indicated in monitor instructions. Patch will be placed under collarbone on left  side of chest with arrow pointing upward.  Rub patch adhesive wings for 2 minutes. Remove white label marked 1. Remove the white  label marked 2. Rub patch adhesive wings for 2 additional minutes.  While looking in a mirror, press and release button in center of patch. A small green light will  flash 3-4 times. This will be your only indicator that the monitor has been turned on.  Do not shower for the first 24 hours. You may shower after the first 24 hours.  Press the button if you feel a symptom. You will hear a small click. Record Date, Time and  Symptom in the Patient Logbook.  When you are ready to remove the patch, follow instructions on the last 2 pages of Patient  Logbook. Stick patch monitor onto the last page of Patient Logbook.  Place Patient Logbook in the blue and white box. Use locking tab on box and tape box closed  securely. The blue and white box has prepaid postage on it. Please place it in the mailbox as  soon as possible. Your physician should have your test results approximately 7 days after the  monitor has been mailed back to [facility].  Call [facility] Customer Care at [phone] if you have questions regarding  your ZIO XT patch monitor. Call them immediately if you see an orange light blinking on your  monitor.  If your monitor falls off in less than 4 days, contact our Monitor department at [phone].  If your monitor becomes loose or falls off after 4 days call [employer] at [phone] for  suggestions on securing your monitor    Your cardiac CT will be scheduled at  [facility] [address]  [place], [place] [address] [phone]  Please enter the parking lot using the [address] entrance and use the FREE valet service at the patient drop-off area. Enter the building and check-in with registration on the main floor.  Please follow these instructions carefully (unless otherwise directed):  An IV will be required for this test and Nitroglycerin will be given.   On the Night Before the Test: Be sure to Drink plenty of water . Do not consume any caffeinated/decaffeinated beverages or chocolate 12 hours prior to your test. Do not take any antihistamines 12 hours prior to your test.  On the Day of the Test: Drink plenty of water  until 1 hour prior to the test. Do not eat any food 1 hour prior to test. You may take your regular medications prior to the test.  Take metoprolol (Lopressor) two hours prior to test. Patients who wear a continuous glucose monitor MUST remove the device prior to scanning. FEMALES- please wear underwire-free bra if available, avoid dresses & tight clothing  After the Test: Drink plenty of water . After receiving IV contrast, you may experience a mild flushed feeling. This is normal. On occasion, you may experience a mild rash up to 24 hours after the test. This is not dangerous. If this occurs, you can take Benadryl 25 mg, Zyrtec, Claritin, or Allegra and increase your fluid intake. (Patients taking Tikosyn should avoid Benadryl, and may take Zyrtec, Claritin, or Allegra) If you experience trouble breathing, this can be serious. If it is severe call 911 IMMEDIATELY. If it is mild, please call our office.  We will call to schedule your test 2-4 weeks out understanding that some insurance companies will need an authorization prior to the service being performed.   For more information and frequently asked questions, please visit our website : [email or web address]  For non-scheduling related questions, please  contact the cardiac imaging nurse navigator should you have any questions/concerns: Cardiac Imaging Nurse Navigators Direct Office Dial: [phone]   For scheduling needs, including cancellations and rescheduling, please call [place], [phone].

## 2024-04-15 ENCOUNTER — Telehealth (HOSPITAL_COMMUNITY): Payer: Self-pay | Admitting: Emergency Medicine

## 2024-04-15 NOTE — Telephone Encounter (Signed)
 Reaching out to patient to offer assistance regarding upcoming cardiac imaging study; pt verbalizes understanding of appt date/time, parking situation and where to check in, pre-test NPO status and medications ordered, and verified current allergies; name and call back number provided for further questions should they arise Rockwell Alexandria RN Navigator Cardiac Imaging [facility] [phone] office [phone] cell

## 2024-04-16 ENCOUNTER — Ambulatory Visit (HOSPITAL_COMMUNITY): Admission: RE | Admit: 2024-04-16 | Source: Ambulatory Visit

## 2024-04-22 ENCOUNTER — Ambulatory Visit: Admitting: Obstetrics & Gynecology

## 2024-04-23 ENCOUNTER — Telehealth (HOSPITAL_COMMUNITY): Payer: Self-pay | Admitting: *Deleted

## 2024-04-23 NOTE — Telephone Encounter (Signed)
 Encounter opened in error please disregard

## 2024-04-24 ENCOUNTER — Ambulatory Visit (HOSPITAL_BASED_OUTPATIENT_CLINIC_OR_DEPARTMENT_OTHER)
Admission: RE | Admit: 2024-04-24 | Discharge: 2024-04-24 | Disposition: A | Discharge status: Home / Self Care | Source: Ambulatory Visit | Attending: Cardiology | Admitting: Cardiology

## 2024-04-24 ENCOUNTER — Ambulatory Visit (HOSPITAL_COMMUNITY)
Admission: RE | Admit: 2024-04-24 | Discharge: 2024-04-24 | Disposition: A | Discharge status: Home / Self Care | Source: Ambulatory Visit | Attending: Cardiology | Admitting: Cardiology

## 2024-04-24 DIAGNOSIS — R002 Palpitations: Secondary | ICD-10-CM | POA: Insufficient documentation

## 2024-04-24 DIAGNOSIS — R011 Cardiac murmur, unspecified: Secondary | ICD-10-CM | POA: Diagnosis not present

## 2024-04-24 DIAGNOSIS — R079 Chest pain, unspecified: Secondary | ICD-10-CM | POA: Insufficient documentation

## 2024-04-24 DIAGNOSIS — R072 Precordial pain: Secondary | ICD-10-CM | POA: Diagnosis present

## 2024-04-24 LAB — ECHOCARDIOGRAM COMPLETE
AR max vel: 2.22 cm2
AV Area VTI: 2.3 cm2
AV Area mean vel: 1.85 cm2
AV Mean grad: 4 mmHg
AV Peak grad: 7.2 mmHg
Ao pk vel: 1.34 m/s
Area-P 1/2: 4.12 cm2
Calc EF: 59.4 %
MV VTI: 2.19 cm2
P 1/2 time: 553 ms
S' Lateral: 2.6 cm
Single Plane A2C EF: 61.1 %
Single Plane A4C EF: 58.9 %

## 2024-04-24 MED ORDER — NITROGLYCERIN 0.4 MG SL SUBL
0.8000 mg | SUBLINGUAL_TABLET | Freq: Once | SUBLINGUAL | Status: AC
  Administered 2024-04-24: 0.8 mg via SUBLINGUAL

## 2024-04-24 MED ORDER — IOHEXOL 350 MG/ML SOLN
100.0000 mL | Freq: Once | INTRAVENOUS | Status: AC | PRN
Start: 2024-04-24 — End: 2024-04-24
  Administered 2024-04-24: 100 mL via INTRAVENOUS

## 2024-04-25 ENCOUNTER — Ambulatory Visit (HOSPITAL_BASED_OUTPATIENT_CLINIC_OR_DEPARTMENT_OTHER): Payer: Self-pay | Admitting: Cardiology

## 2024-05-22 ENCOUNTER — Telehealth: Payer: Self-pay | Admitting: Gastroenterology

## 2024-05-22 NOTE — Telephone Encounter (Signed)
 Patient called in and says she is having right side pain into her back. Patient says only thing will ease the pain is placing something heat on area. Sent to nurse.

## 2024-05-22 NOTE — Telephone Encounter (Signed)
 Pt needs an appt to be evaluated or go to ED for evaluation

## 2024-05-23 NOTE — Telephone Encounter (Signed)
 noted

## 2024-06-02 ENCOUNTER — Ambulatory Visit (INDEPENDENT_AMBULATORY_CARE_PROVIDER_SITE_OTHER): Admitting: Cardiology

## 2024-06-02 ENCOUNTER — Encounter (HOSPITAL_BASED_OUTPATIENT_CLINIC_OR_DEPARTMENT_OTHER): Payer: Self-pay | Admitting: Cardiology

## 2024-06-02 VITALS — BP 114/80 | HR 85 | Ht 62.5 in | Wt 163.0 lb

## 2024-06-02 DIAGNOSIS — I10 Essential (primary) hypertension: Secondary | ICD-10-CM

## 2024-06-02 DIAGNOSIS — R5382 Chronic fatigue, unspecified: Secondary | ICD-10-CM

## 2024-06-02 DIAGNOSIS — I2510 Atherosclerotic heart disease of native coronary artery without angina pectoris: Secondary | ICD-10-CM | POA: Diagnosis not present

## 2024-06-02 DIAGNOSIS — Z712 Person consulting for explanation of examination or test findings: Secondary | ICD-10-CM

## 2024-06-02 DIAGNOSIS — Z7189 Other specified counseling: Secondary | ICD-10-CM

## 2024-06-02 DIAGNOSIS — R002 Palpitations: Secondary | ICD-10-CM | POA: Diagnosis not present

## 2024-06-02 DIAGNOSIS — R0789 Other chest pain: Secondary | ICD-10-CM

## 2024-06-02 DIAGNOSIS — I700 Atherosclerosis of aorta: Secondary | ICD-10-CM

## 2024-06-02 NOTE — Progress Notes (Signed)
 Cardiology Office Note:  .   Date:  06/02/2024  ID:  Susan Baker, DOB 1961-05-27, MRN [id number] PCP: Susan Agent, MD (Inactive)  [facility] Providers Cardiologist:  Susan Bruckner, MD {  History of Present Illness: .   Susan Baker is a 63 y.o. female with PMH moderate aortic regurgitation, nonobstructive CAD, lupus, mixed connective tissue disease, former tobacco use, fibromyalgia, hypertension. I met her 04/10/24 for the evaluation of palpitations and chest pain. .  CV history: Initially referred for palpitations, but patient noted chest pain as well. Risk factors: former smoker (quit ~2020) but still vapes. Has inflammatory disease as risk factor as well-- notes diffuse pains in shoulders, neck, etc. She has difficulty in determining what is due to lupus/inflammation and what might be due to her heart.  Workup: Coronary CT 05/01/24 with Ca score 77 (84th %ile). Mild nonobstructive CAD. Possible small PFO. Echo 04/24/24 with EF 60-65%, normal diastology. Aortic valve with moderate regurgitation, otherwise unremarkable. PFO not seen. Monitor showed only one 4 beat episode of SVT (asymptomatic), and 84 patient triggered events were all sinus.  Today: Overall doing ok. General fatigue is chronic and her biggest issue. Chest pain is not as bad as before, happening daily, intermittent, mild. Mostly left sided, both dull and sharp. Palpitations are slightly better than prior.   Reviewed her test results at length together today.  No intentional exercise but stays active around the house.  ROS: No PND, orthopnea, LE edema. No syncope. ROS otherwise negative except as noted.   Studies Reviewed: Susan Baker    EKG:       Physical Exam:   VS:  BP 114/80 (BP Location: Right Arm, Patient Position: Sitting, Cuff Size: Normal)   Pulse 85   Ht 5' 2.5 (1.588 m)   Wt 163 lb (73.9 kg)   SpO2 98%   BMI 29.34 kg/m    Wt Readings from Last 3 Encounters:  06/02/24 163 lb (73.9 kg)   04/10/24 162 lb 3.2 oz (73.6 kg)  01/22/24 156 lb (70.8 kg)    GEN: Well nourished, well developed in no acute distress HEENT: Normal, moist mucous membranes NECK: No JVD CARDIAC: regular rhythm, normal S1 and S2, no rubs or gallops. No murmur. VASCULAR: Radial and DP pulses 2+ bilaterally. No carotid bruits RESPIRATORY:  Clear to auscultation without rales, wheezing or rhonchi  ABDOMEN: Soft, non-tender, non-distended MUSCULOSKELETAL:  Ambulates independently SKIN: Warm and dry, no edema NEUROLOGIC:  Alert and oriented x 3. No focal neuro deficits noted. PSYCHIATRIC:  Normal affect    ASSESSMENT AND PLAN: .    Chest pain, noncardiac Palpitations, not related to arrhythmia Generalized fatigue -workup very reassuring. We discussed results at length today. Nonobstructive CAD on CT, echo normal except for aortic regurgitation, monitor with sinus as symptomatic events -reviewed potential noncardiac etiologies  Nonobstructive CAD Aortic atherosclerosis -risk factors: former smoker, long term inflammation with lupus -no recent lipids available to me. She will check and see if she has had this recently. Will give lab slips today in case for fasting lipids -discussed lpa, she is amenable -we discussed guideline recommendations on aspirin and statin today. She bruises easily and is slow to heal with the prednisone, will hold on aspirin. Discussed rosuvastatin based on lipid results.  Aortic regurgitation -read as moderate on the echo, on my review appears more mild. Appears to be likely tricuspid valve, normal aorta size. PHT 553. No LV enlargement. -recheck in 2-3 years or sooner if symptoms develop  Hypertension -well controlled, continue lisinopril  Comorbidities: lupus/mixed connective tissue disease, fibromyalgia can cause similar symptoms. There is mention of fatty liver in the chart, but her last fibrosure scan was normal in 2024.  She asked about GLP today. We did discuss in  general. She is overweight with a BMI of 29 and on chronic prednisone. No prior MI/CVA/HF, no sleep apnea (pending test). She denies any history of pancreatitis but does not intermittent abdominal symptoms. She follows with GI, recommended she discuss if GLP would be an option with her symptoms.  CV risk counseling and prevention -recommend heart healthy/Mediterranean diet, with whole grains, fruits, vegetable, fish, lean meats, nuts, and olive oil. Limit salt. -recommend moderate walking, 3-5 times/week for 30-50 minutes each session. Aim for at least 150 minutes/week. Goal should be pace of 3 miles/hours, or walking 1.5 miles in 30 minutes -recommend avoidance of tobacco products. Avoid excess alcohol.  Dispo: 1 year or sooner as needed. Recheck lipids/LFTs in 3 mos if statin started  Total time of encounter: I spent 51 minutes dedicated to the care of this patient on the date of this encounter to include pre-visit review of records, face-to-face time with the patient discussing conditions above, and clinical documentation with the electronic health record. We specifically spent time today discussing all of her test results, what this tells us  about her heart and possible etiology of her symptoms, recommendations for cholesterol management, signs/symptoms to watch for   Signed, Susan Bruckner, MD   Susan Bruckner, MD, PhD, [facility]  [facility]  [place]  Heart & Vascular at [facility] at [facility] [address], [address] [place], [place] [address] [phone]

## 2024-06-02 NOTE — Patient Instructions (Signed)
 Medication Instructions:  No changes today *If you need a refill on your cardiac medications before your next appointment, please call your pharmacy*  Lab Work: Please go to any [facility] (fasting) for blood work (lipid panel and LP(a))  Testing/Procedures: none  Follow-Up: At [facility], you and your health needs are our priority.  As part of our continuing mission to provide you with exceptional heart care, our providers are all part of one team.  This team includes your primary Cardiologist (physician) and Advanced Practice Providers or APPs (Physician Assistants and Nurse Practitioners) who all work together to provide you with the care you need, when you need it.  Your next appointment:   12 month(s)  Provider:   Shelda Bruckner, MD, Rosaline Bane, NP, or Reche Finder, NP

## 2024-06-09 ENCOUNTER — Ambulatory Visit: Admitting: Gastroenterology

## 2024-06-09 ENCOUNTER — Ambulatory Visit: Admitting: Obstetrics & Gynecology

## 2024-06-10 ENCOUNTER — Encounter: Payer: Self-pay | Admitting: Gastroenterology

## 2024-06-16 ENCOUNTER — Ambulatory Visit: Admitting: Internal Medicine

## 2024-07-08 ENCOUNTER — Encounter: Payer: Self-pay | Admitting: Obstetrics & Gynecology

## 2024-07-08 ENCOUNTER — Ambulatory Visit: Admitting: Obstetrics & Gynecology

## 2024-07-08 VITALS — BP 96/61 | HR 101 | Ht 62.25 in | Wt 163.0 lb

## 2024-07-08 DIAGNOSIS — N763 Subacute and chronic vulvitis: Secondary | ICD-10-CM | POA: Diagnosis not present

## 2024-07-08 DIAGNOSIS — N3281 Overactive bladder: Secondary | ICD-10-CM

## 2024-07-08 MED ORDER — SOLIFENACIN SUCCINATE 10 MG PO TABS: 10.0000 mg | ORAL_TABLET | Freq: Every day | ORAL | 11 refills | Status: AC

## 2024-07-08 NOTE — Progress Notes (Unsigned)
 Follow up appointment for results: ***  Chief Complaint  Patient presents with   Follow-up    Blood pressure 96/61, pulse (!) 101, height 5' 2.25 (1.581 m), weight 163 lb (73.9 kg).  No results found.  ***  MEDS ordered this encounter: Meds ordered this encounter  Medications   solifenacin  (VESICARE ) 10 MG tablet    Sig: Take 1 tablet (10 mg total) by mouth daily.    Dispense:  30 tablet    Refill:  11    Orders for this encounter: No orders of the defined types were placed in this encounter.   Impression + Management Plan No diagnosis found.  Follow Up: No follow-ups on file.     All questions were answered.  Past Medical History:  Diagnosis Date   Abnormal Pap smear    Abnormal ThinPrep Pap test of vagina 04/25/2017   LSIL will get colp__________   Anxiety    Bulging disc    cervical, [facility]   COPD (chronic obstructive pulmonary disease) (HCC)    Depression 02/18/2013   Fibromyalgia    GERD (gastroesophageal reflux disease)    egd with RE per patient, remote   Hematuria 05/14/2014   History of abnormal cervical Pap smear 02/24/2014   Hypertension    Lupus (systemic lupus erythematosus) (HCC)    Osteoarthritis (arthritis due to wear and tear of joints)    Osteoporosis    Osteoporosis, unspecified 03/03/2014   Postcoital bleeding 09/15/2015   Postmenopausal 02/24/2014   PUD (peptic ulcer disease)    bleeding PUD per patient, remote, On Daypro   Urinary frequency 05/14/2014   Uterine cancer (HCC)    Vaginal Pap smear, abnormal     Past Surgical History:  Procedure Laterality Date   BIOPSY  01/18/2022   Procedure: BIOPSY;  Surgeon: Shaaron Lamar HERO, MD;  Location: [facility];  Service: Endoscopy;;   BRONCHIAL BIOPSY  01/26/2022   Procedure: BRONCHIAL BIOPSIES;  Surgeon: Shelah Lamar RAMAN, MD;  Location: [facility] ENDOSCOPY;  Service: Cardiopulmonary;;   BRONCHIAL BRUSHINGS  01/26/2022   Procedure: BRONCHIAL BRUSHINGS;  Surgeon: Shelah Lamar RAMAN, MD;   Location: [facility] ENDOSCOPY;  Service: Cardiopulmonary;;   BRONCHIAL WASHINGS  01/26/2022   Procedure: BRONCHIAL WASHINGS;  Surgeon: Shelah Lamar RAMAN, MD;  Location: [facility]OSCOPY;  Service: Cardiopulmonary;;   CHOLECYSTECTOMY N/A 11/07/2019   Procedure: LAPAROSCOPIC CHOLECYSTECTOMY;  Surgeon: Kallie Manuelita BROCKS, MD;  Location: [facility];  Service: General;  Laterality: N/A;   COLONOSCOPY WITH PROPOFOL  N/A 11/22/2015   Dr.Rourk- the entire examined colon is normal, the examined portion of the ileum was normal, non-bleeding internal hemorrhoids. no specimens collected.    ECTOPIC PREGNANCY SURGERY     twice   ESOPHAGEAL DILATION N/A 11/22/2015   Procedure: ESOPHAGEAL DILATION;  Surgeon: Lamar HERO Shaaron, MD;  Location: [facility];  Service: Endoscopy;  Laterality: N/A;   ESOPHAGOGASTRODUODENOSCOPY  12/01/2010   Dr. Shaaron: mild distal ERE, antral erosions due to NSAIDS, no H.Pylori   ESOPHAGOGASTRODUODENOSCOPY (EGD) WITH PROPOFOL  N/A 11/22/2015   Dr.Rourk- grade A esophagitis, dilated. small hiatal hernia, normal stomach, normal second portion of the duodenum.   ESOPHAGOGASTRODUODENOSCOPY (EGD) WITH PROPOFOL  N/A 10/27/2019   normal esophagus, normal stomach, normal duodenum.    ESOPHAGOGASTRODUODENOSCOPY (EGD) WITH PROPOFOL  N/A 01/18/2022   Procedure: ESOPHAGOGASTRODUODENOSCOPY (EGD) WITH PROPOFOL ;  Surgeon: Shaaron Lamar HERO, MD;  Location: [facility];  Service: Endoscopy;  Laterality: N/A;  12:45pm   hysterectomy for uterine cancer     partial  LASER ABLATION CONDOLAMATA N/A 12/12/2023   Procedure: ABLATION, CONDYLOMA, USING LASER;  Surgeon: Jayne Vonn DEL, MD;  Location: [facility];  Service: Gynecology;  Laterality: N/A;   VIDEO BRONCHOSCOPY N/A 01/26/2022   Procedure: VIDEO BRONCHOSCOPY WITHOUT FLUORO;  Surgeon: Shelah Lamar RAMAN, MD;  Location: [facility] ENDOSCOPY;  Service: Cardiopulmonary;  Laterality: N/A;    OB History     Gravida  4   Para  2   Term  2   Preterm      AB  2   Living  2      SAB       IAB      Ectopic  2   Multiple      Live Births  2           Allergies[1]  Social History   Socioeconomic History   Marital status: Divorced    Spouse name: Not on file   Number of children: 2   Years of education: Not on file   Highest education level: Not on file  Occupational History   Occupation: disability  Tobacco Use   Smoking status: Former    Current packs/day: 0.00    Average packs/day: 0.5 packs/day for 35.0 years (17.5 ttl pk-yrs)    Types: E-cigarettes, Cigarettes    Start date: 07/22/1984    Quit date: 07/23/2019    Years since quitting: 4.9   Smokeless tobacco: Never   Tobacco comments:    using vape at times  Vaping Use   Vaping status: Every Day   Substances: Nicotine-salt  Substance and Sexual Activity   Alcohol use: Not Currently    Comment: occasional social use    Drug use: No   Sexual activity: Not Currently    Birth control/protection: Surgical    Comment: hysterectomy  Other Topics Concern   Not on file  Social History Narrative   Not on file   Social Drivers of Health   Tobacco Use: Medium Risk (06/02/2024)   Patient History    Smoking Tobacco Use: Former    Smokeless Tobacco Use: Never    Passive Exposure: Not on Actuary Strain: Not on file  Food Insecurity: Not on file  Transportation Needs: Not on file  Physical Activity: Not on file  Stress: Not on file  Social Connections: Not on file  Depression (PHQ2-9): Not on file  Alcohol Screen: Not on file  Housing: Not on file  Utilities: Not on file  Health Literacy: Not on file    Family History  Problem Relation Age of Onset   Cirrhosis Father        etoh, died with liver cancer   Diabetes Father    Cirrhosis Paternal Uncle        multiple, etoh   Pancreatic cancer Paternal Grandmother    Diabetes Paternal Grandmother    Kidney failure Mother    Diabetes Mother    Other Brother        crohn's disease   Hyperlipidemia Brother     Fibromyalgia Daughter    Diabetes Maternal Grandmother    Healthy Son    Colon cancer Maternal Uncle    Colon polyps Neg Hx        [1]  Allergies Allergen Reactions   Cymbalta [Duloxetine Hcl] Other (See Comments)    Depression, anxiety, wt gain   Daypro [Oxaprozin] Other (See Comments)    Ulcers   Sulfa  Antibiotics Other (See Comments)    Bactrim -Could not function  Trazodone And Nefazodone Other (See Comments)    Could not function   Nucynta [Tapentadol] Other (See Comments)    Memory loss and slurred speech
